# Patient Record
Sex: Male | Born: 1955
Health system: Southern US, Community
[De-identification: ages and names within clinical notes are randomized; demographics above are authoritative.]

## PROBLEM LIST (undated history)

## (undated) DIAGNOSIS — J019 Acute sinusitis, unspecified: Secondary | ICD-10-CM

## (undated) DIAGNOSIS — G5600 Carpal tunnel syndrome, unspecified upper limb: Secondary | ICD-10-CM

## (undated) DIAGNOSIS — E785 Hyperlipidemia, unspecified: Secondary | ICD-10-CM

## (undated) DIAGNOSIS — M5030 Other cervical disc degeneration, unspecified cervical region: Secondary | ICD-10-CM

## (undated) DIAGNOSIS — E538 Deficiency of other specified B group vitamins: Secondary | ICD-10-CM

## (undated) DIAGNOSIS — M1710 Unilateral primary osteoarthritis, unspecified knee: Secondary | ICD-10-CM

## (undated) DIAGNOSIS — T7840XA Allergy, unspecified, initial encounter: Secondary | ICD-10-CM

## (undated) DIAGNOSIS — E291 Testicular hypofunction: Secondary | ICD-10-CM

## (undated) DIAGNOSIS — E039 Hypothyroidism, unspecified: Secondary | ICD-10-CM

## (undated) DIAGNOSIS — J309 Allergic rhinitis, unspecified: Secondary | ICD-10-CM

## (undated) DIAGNOSIS — IMO0002 Reserved for concepts with insufficient information to code with codable children: Secondary | ICD-10-CM

## (undated) DIAGNOSIS — K589 Irritable bowel syndrome without diarrhea: Secondary | ICD-10-CM

## (undated) HISTORY — DX: Carpal tunnel syndrome, unspecified upper limb: G56.00

## (undated) HISTORY — DX: Hyperlipidemia, unspecified: E78.5

## (undated) HISTORY — DX: Unilateral primary osteoarthritis, unspecified knee: M17.10

## (undated) HISTORY — DX: Allergic rhinitis, unspecified: J30.9

## (undated) HISTORY — DX: Acute sinusitis, unspecified: J01.90

## (undated) HISTORY — PX: KNEE SURGERY: SHX244

## (undated) HISTORY — DX: Deficiency of other specified B group vitamins: E53.8

## (undated) HISTORY — DX: Other cervical disc degeneration, unspecified cervical region: M50.30

## (undated) HISTORY — DX: Hypothyroidism, unspecified: E03.9

## (undated) HISTORY — DX: Testicular hypofunction: E29.1

## (undated) HISTORY — DX: Reserved for concepts with insufficient information to code with codable children: IMO0002

## (undated) HISTORY — DX: Allergy, unspecified, initial encounter: T78.40XA

## (undated) HISTORY — DX: Irritable bowel syndrome without diarrhea: K58.9

## (undated) HISTORY — PX: BACK SURGERY: SHX140

---

## 2004-12-12 ENCOUNTER — Ambulatory Visit: Payer: Self-pay | Admitting: Internal Medicine

## 2004-12-19 ENCOUNTER — Ambulatory Visit: Payer: Self-pay | Admitting: Internal Medicine

## 2005-02-05 ENCOUNTER — Ambulatory Visit: Payer: Self-pay | Admitting: Internal Medicine

## 2005-02-28 ENCOUNTER — Ambulatory Visit: Payer: Self-pay | Admitting: Internal Medicine

## 2005-03-28 ENCOUNTER — Ambulatory Visit: Payer: Self-pay | Admitting: Internal Medicine

## 2005-04-26 ENCOUNTER — Ambulatory Visit: Payer: Self-pay | Admitting: Internal Medicine

## 2005-05-21 ENCOUNTER — Ambulatory Visit: Payer: Self-pay | Admitting: Internal Medicine

## 2005-06-11 ENCOUNTER — Ambulatory Visit: Payer: Self-pay | Admitting: Internal Medicine

## 2005-06-21 ENCOUNTER — Ambulatory Visit: Payer: Self-pay | Admitting: Internal Medicine

## 2005-07-11 ENCOUNTER — Ambulatory Visit: Payer: Self-pay | Admitting: Internal Medicine

## 2005-08-13 ENCOUNTER — Ambulatory Visit: Payer: Self-pay | Admitting: Internal Medicine

## 2005-08-20 ENCOUNTER — Ambulatory Visit: Payer: Self-pay | Admitting: Internal Medicine

## 2005-08-31 ENCOUNTER — Ambulatory Visit: Payer: Self-pay | Admitting: Internal Medicine

## 2005-09-18 ENCOUNTER — Ambulatory Visit: Payer: Self-pay | Admitting: Internal Medicine

## 2005-10-10 ENCOUNTER — Ambulatory Visit: Payer: Self-pay | Admitting: Internal Medicine

## 2006-03-04 ENCOUNTER — Ambulatory Visit: Payer: Self-pay | Admitting: Internal Medicine

## 2006-03-04 LAB — CONVERTED CEMR LAB
BUN: 11 mg/dL (ref 6–23)
Basophils Relative: 0.1 % (ref 0.0–1.0)
CO2: 34 meq/L — ABNORMAL HIGH (ref 19–32)
Calcium: 9.3 mg/dL (ref 8.4–10.5)
Chloride: 100 meq/L (ref 96–112)
Cholesterol: 179 mg/dL (ref 0–200)
Creatinine, Ser: 0.8 mg/dL (ref 0.4–1.5)
Free T4: 0.9 ng/dL (ref 0.9–1.8)
GFR calc non Af Amer: 110 mL/min
Glucose, Bld: 83 mg/dL (ref 70–99)
HCT: 44.2 % (ref 39.0–52.0)
HDL: 51.2 mg/dL (ref >39.0)
Hemoglobin: 14.9 g/dL (ref 13.0–17.0)
LDL Cholesterol: 114 mg/dL — ABNORMAL HIGH (ref 0–99)
Lymphocytes Relative: 28.6 % (ref 12.0–46.0)
MCHC: 33.8 g/dL (ref 30.0–36.0)
Monocytes Absolute: 0.4 10*3/uL (ref 0.2–0.7)
Monocytes Relative: 8.6 % (ref 3.0–11.0)
Neutro Abs: 2.8 10*3/uL (ref 1.4–7.7)
Neutrophils Relative %: 58.9 % (ref 43.0–77.0)
PSA: 0.78 ng/mL (ref 0.10–4.00)
RDW: 12.1 % (ref 11.5–14.6)
TSH: 5.4 microintl units/mL (ref 0.35–5.50)
VLDL: 13 mg/dL (ref 0–40)

## 2006-03-11 ENCOUNTER — Ambulatory Visit: Payer: Self-pay | Admitting: Internal Medicine

## 2006-04-15 HISTORY — PX: LUMBAR LAMINECTOMY: SHX95

## 2006-06-04 ENCOUNTER — Ambulatory Visit: Payer: Self-pay | Admitting: Internal Medicine

## 2006-06-04 LAB — CONVERTED CEMR LAB
T3 Uptake Ratio: 38.9 % — ABNORMAL HIGH (ref 22.5–37.0)
TSH: 4.6 microintl units/mL (ref 0.35–5.50)

## 2006-06-11 ENCOUNTER — Ambulatory Visit: Payer: Self-pay | Admitting: Internal Medicine

## 2006-08-06 ENCOUNTER — Ambulatory Visit: Payer: Self-pay | Admitting: Internal Medicine

## 2006-08-06 LAB — CONVERTED CEMR LAB: TSH: 2.7 microintl units/mL (ref 0.35–5.50)

## 2006-08-25 ENCOUNTER — Ambulatory Visit: Payer: Self-pay | Admitting: Internal Medicine

## 2006-10-08 DIAGNOSIS — E039 Hypothyroidism, unspecified: Secondary | ICD-10-CM

## 2006-10-08 DIAGNOSIS — E785 Hyperlipidemia, unspecified: Secondary | ICD-10-CM

## 2006-10-08 DIAGNOSIS — J309 Allergic rhinitis, unspecified: Secondary | ICD-10-CM | POA: Insufficient documentation

## 2006-10-08 HISTORY — DX: Hypothyroidism, unspecified: E03.9

## 2006-10-08 HISTORY — DX: Hyperlipidemia, unspecified: E78.5

## 2006-10-08 HISTORY — DX: Allergic rhinitis, unspecified: J30.9

## 2006-12-24 ENCOUNTER — Ambulatory Visit: Payer: Self-pay | Admitting: Internal Medicine

## 2006-12-24 LAB — CONVERTED CEMR LAB
ALT: 33 units/L (ref 0–53)
AST: 30 units/L (ref 0–37)
Albumin: 3.9 g/dL (ref 3.5–5.2)
Basophils Absolute: 0 10*3/uL (ref 0.0–0.1)
Blood in Urine, dipstick: NEGATIVE
Calcium: 9.8 mg/dL (ref 8.4–10.5)
Chloride: 105 meq/L (ref 96–112)
Creatinine, Ser: 0.8 mg/dL (ref 0.4–1.5)
Eosinophils Absolute: 0.2 10*3/uL (ref 0.0–0.6)
Eosinophils Relative: 4.3 % (ref 0.0–5.0)
GFR calc non Af Amer: 110 mL/min
Glucose, Bld: 86 mg/dL (ref 70–99)
HDL: 48 mg/dL (ref >39.0)
Ketones, urine, test strip: NEGATIVE
MCV: 87.5 fL (ref 78.0–100.0)
Nitrite: NEGATIVE
Platelets: 239 10*3/uL (ref 150–400)
Protein, U semiquant: NEGATIVE
RBC: 4.6 M/uL (ref 4.22–5.81)
RDW: 12.1 % (ref 11.5–14.6)
Specific Gravity, Urine: 1.02
Total CHOL/HDL Ratio: 4.2
Triglycerides: 76 mg/dL (ref 0–149)
Urobilinogen, UA: 0.2
WBC: 5 10*3/uL (ref 4.5–10.5)

## 2006-12-31 ENCOUNTER — Ambulatory Visit: Payer: Self-pay | Admitting: Internal Medicine

## 2006-12-31 ENCOUNTER — Encounter: Admission: RE | Admit: 2006-12-31 | Discharge: 2006-12-31 | Payer: Self-pay | Admitting: Sports Medicine

## 2006-12-31 DIAGNOSIS — IMO0002 Reserved for concepts with insufficient information to code with codable children: Secondary | ICD-10-CM

## 2006-12-31 DIAGNOSIS — M5386 Other specified dorsopathies, lumbar region: Secondary | ICD-10-CM | POA: Insufficient documentation

## 2006-12-31 HISTORY — DX: Reserved for concepts with insufficient information to code with codable children: IMO0002

## 2006-12-31 LAB — CONVERTED CEMR LAB
HDL goal, serum: 40 mg/dL
LDL Goal: 160 mg/dL

## 2007-01-05 ENCOUNTER — Telehealth: Payer: Self-pay | Admitting: Internal Medicine

## 2007-01-08 ENCOUNTER — Ambulatory Visit (HOSPITAL_COMMUNITY): Admission: RE | Admit: 2007-01-08 | Discharge: 2007-01-09 | Payer: Self-pay | Admitting: Neurosurgery

## 2007-01-26 ENCOUNTER — Encounter: Payer: Self-pay | Admitting: Internal Medicine

## 2007-03-17 ENCOUNTER — Ambulatory Visit: Payer: Self-pay | Admitting: Internal Medicine

## 2007-03-17 LAB — CONVERTED CEMR LAB
Bilirubin, Direct: 0.1 mg/dL (ref 0.0–0.3)
Cholesterol: 201 mg/dL (ref 0–200)
Direct LDL: 140.7 mg/dL
HDL: 42.2 mg/dL (ref >39.0)
Total Bilirubin: 0.7 mg/dL (ref 0.3–1.2)
Total Protein: 6.4 g/dL (ref 6.0–8.3)
Triglycerides: 61 mg/dL (ref 0–149)

## 2007-04-02 ENCOUNTER — Ambulatory Visit: Payer: Self-pay | Admitting: Internal Medicine

## 2007-05-01 ENCOUNTER — Telehealth: Payer: Self-pay | Admitting: Internal Medicine

## 2007-09-23 ENCOUNTER — Ambulatory Visit: Payer: Self-pay | Admitting: Internal Medicine

## 2007-09-23 LAB — CONVERTED CEMR LAB
ALT: 31 units/L (ref 0–53)
Alkaline Phosphatase: 71 units/L (ref 39–117)
Bilirubin, Direct: 0.1 mg/dL (ref 0.0–0.3)
Cholesterol: 177 mg/dL (ref 0–200)
LDL Cholesterol: 116 mg/dL — ABNORMAL HIGH (ref 0–99)
Total Protein: 6.1 g/dL (ref 6.0–8.3)

## 2007-09-30 ENCOUNTER — Ambulatory Visit: Payer: Self-pay | Admitting: Internal Medicine

## 2007-11-08 ENCOUNTER — Encounter: Admission: RE | Admit: 2007-11-08 | Discharge: 2007-11-08 | Payer: Self-pay | Admitting: Neurosurgery

## 2007-12-01 ENCOUNTER — Telehealth: Payer: Self-pay | Admitting: Internal Medicine

## 2007-12-01 ENCOUNTER — Encounter: Admission: RE | Admit: 2007-12-01 | Discharge: 2007-12-01 | Payer: Self-pay | Admitting: Neurosurgery

## 2007-12-15 ENCOUNTER — Telehealth: Payer: Self-pay | Admitting: Internal Medicine

## 2007-12-31 ENCOUNTER — Ambulatory Visit: Payer: Self-pay | Admitting: Internal Medicine

## 2007-12-31 DIAGNOSIS — M5030 Other cervical disc degeneration, unspecified cervical region: Secondary | ICD-10-CM

## 2007-12-31 DIAGNOSIS — G5600 Carpal tunnel syndrome, unspecified upper limb: Secondary | ICD-10-CM

## 2007-12-31 HISTORY — DX: Other cervical disc degeneration, unspecified cervical region: M50.30

## 2007-12-31 HISTORY — DX: Carpal tunnel syndrome, unspecified upper limb: G56.00

## 2007-12-31 LAB — CONVERTED CEMR LAB: TSH: 0.74 microintl units/mL (ref 0.35–5.50)

## 2008-01-20 ENCOUNTER — Telehealth: Payer: Self-pay | Admitting: Internal Medicine

## 2008-01-22 ENCOUNTER — Telehealth: Payer: Self-pay | Admitting: Internal Medicine

## 2008-03-03 ENCOUNTER — Ambulatory Visit: Payer: Self-pay | Admitting: Internal Medicine

## 2008-03-03 DIAGNOSIS — K589 Irritable bowel syndrome without diarrhea: Secondary | ICD-10-CM

## 2008-03-03 DIAGNOSIS — R404 Transient alteration of awareness: Secondary | ICD-10-CM

## 2008-03-03 HISTORY — DX: Irritable bowel syndrome, unspecified: K58.9

## 2008-03-14 ENCOUNTER — Telehealth: Payer: Self-pay | Admitting: Internal Medicine

## 2008-04-04 ENCOUNTER — Telehealth: Payer: Self-pay | Admitting: Internal Medicine

## 2008-04-26 ENCOUNTER — Encounter: Payer: Self-pay | Admitting: Internal Medicine

## 2008-04-29 ENCOUNTER — Encounter: Admission: RE | Admit: 2008-04-29 | Discharge: 2008-04-29 | Payer: Self-pay | Admitting: Neurosurgery

## 2008-06-03 ENCOUNTER — Telehealth: Payer: Self-pay | Admitting: Internal Medicine

## 2008-12-01 ENCOUNTER — Telehealth: Payer: Self-pay | Admitting: *Deleted

## 2008-12-02 ENCOUNTER — Ambulatory Visit: Payer: Self-pay | Admitting: Internal Medicine

## 2008-12-16 ENCOUNTER — Telehealth: Payer: Self-pay | Admitting: Internal Medicine

## 2009-01-04 ENCOUNTER — Encounter: Payer: Self-pay | Admitting: Internal Medicine

## 2009-01-13 ENCOUNTER — Ambulatory Visit: Payer: Self-pay | Admitting: Internal Medicine

## 2009-03-31 ENCOUNTER — Encounter
Admission: RE | Admit: 2009-03-31 | Discharge: 2009-03-31 | Payer: Self-pay | Admitting: Physical Medicine and Rehabilitation

## 2009-04-15 HISTORY — PX: SPINAL FUSION: SHX223

## 2009-05-10 ENCOUNTER — Ambulatory Visit: Payer: Self-pay | Admitting: Internal Medicine

## 2009-05-10 DIAGNOSIS — M1710 Unilateral primary osteoarthritis, unspecified knee: Secondary | ICD-10-CM | POA: Insufficient documentation

## 2009-05-10 HISTORY — DX: Unilateral primary osteoarthritis, unspecified knee: M17.10

## 2009-05-10 LAB — CONVERTED CEMR LAB: T3, Free: 2.9 pg/mL (ref 2.3–4.2)

## 2009-06-05 ENCOUNTER — Telehealth: Payer: Self-pay | Admitting: Internal Medicine

## 2009-06-06 ENCOUNTER — Telehealth: Payer: Self-pay | Admitting: Internal Medicine

## 2009-06-07 DIAGNOSIS — R197 Diarrhea, unspecified: Secondary | ICD-10-CM

## 2009-06-08 ENCOUNTER — Encounter: Payer: Self-pay | Admitting: Internal Medicine

## 2009-06-26 ENCOUNTER — Ambulatory Visit: Payer: Self-pay | Admitting: Internal Medicine

## 2009-06-26 DIAGNOSIS — E291 Testicular hypofunction: Secondary | ICD-10-CM | POA: Insufficient documentation

## 2009-06-26 DIAGNOSIS — S0083XA Contusion of other part of head, initial encounter: Secondary | ICD-10-CM

## 2009-06-26 DIAGNOSIS — S1093XA Contusion of unspecified part of neck, initial encounter: Secondary | ICD-10-CM

## 2009-06-26 DIAGNOSIS — S0003XA Contusion of scalp, initial encounter: Secondary | ICD-10-CM | POA: Insufficient documentation

## 2009-06-26 HISTORY — DX: Testicular hypofunction: E29.1

## 2009-07-07 ENCOUNTER — Ambulatory Visit: Payer: Self-pay | Admitting: Psychology

## 2009-07-22 ENCOUNTER — Ambulatory Visit: Payer: Self-pay | Admitting: Family Medicine

## 2009-07-22 DIAGNOSIS — T7840XA Allergy, unspecified, initial encounter: Secondary | ICD-10-CM | POA: Insufficient documentation

## 2009-07-22 DIAGNOSIS — J019 Acute sinusitis, unspecified: Secondary | ICD-10-CM

## 2009-07-22 DIAGNOSIS — J0101 Acute recurrent maxillary sinusitis: Secondary | ICD-10-CM | POA: Insufficient documentation

## 2009-07-22 HISTORY — DX: Acute sinusitis, unspecified: J01.90

## 2009-07-22 HISTORY — DX: Allergy, unspecified, initial encounter: T78.40XA

## 2009-07-24 ENCOUNTER — Telehealth (INDEPENDENT_AMBULATORY_CARE_PROVIDER_SITE_OTHER): Payer: Self-pay | Admitting: *Deleted

## 2009-07-25 ENCOUNTER — Telehealth: Payer: Self-pay | Admitting: Internal Medicine

## 2009-08-23 ENCOUNTER — Ambulatory Visit: Payer: Self-pay | Admitting: Internal Medicine

## 2009-08-23 LAB — CONVERTED CEMR LAB
Testosterone-% Free: 2.1 % (ref 1.6–2.9)
Testosterone: 141.58 ng/dL — ABNORMAL LOW (ref 350–890)

## 2009-09-06 ENCOUNTER — Ambulatory Visit: Payer: Self-pay | Admitting: Internal Medicine

## 2009-09-21 ENCOUNTER — Telehealth: Payer: Self-pay | Admitting: Internal Medicine

## 2009-11-03 ENCOUNTER — Telehealth: Payer: Self-pay | Admitting: Internal Medicine

## 2009-11-29 ENCOUNTER — Ambulatory Visit: Payer: Self-pay | Admitting: Internal Medicine

## 2009-11-29 LAB — CONVERTED CEMR LAB
Direct LDL: 175 mg/dL
VLDL: 15.4 mg/dL (ref 0.0–40.0)

## 2009-12-04 ENCOUNTER — Telehealth: Payer: Self-pay | Admitting: Internal Medicine

## 2009-12-22 ENCOUNTER — Telehealth: Payer: Self-pay | Admitting: Internal Medicine

## 2009-12-28 ENCOUNTER — Telehealth: Payer: Self-pay | Admitting: Internal Medicine

## 2010-01-22 ENCOUNTER — Telehealth: Payer: Self-pay | Admitting: Internal Medicine

## 2010-01-23 ENCOUNTER — Encounter: Admission: RE | Admit: 2010-01-23 | Discharge: 2010-01-23 | Payer: Self-pay | Admitting: Neurosurgery

## 2010-02-15 ENCOUNTER — Ambulatory Visit: Payer: Self-pay | Admitting: Internal Medicine

## 2010-02-28 ENCOUNTER — Inpatient Hospital Stay (HOSPITAL_COMMUNITY): Admission: RE | Admit: 2010-02-28 | Discharge: 2010-03-07 | Payer: Self-pay | Admitting: Neurosurgery

## 2010-04-05 ENCOUNTER — Encounter
Admission: RE | Admit: 2010-04-05 | Discharge: 2010-04-05 | Payer: Self-pay | Source: Home / Self Care | Attending: Neurosurgery | Admitting: Neurosurgery

## 2010-04-15 HISTORY — PX: OTHER SURGICAL HISTORY: SHX169

## 2010-04-19 ENCOUNTER — Telehealth: Payer: Self-pay | Admitting: Internal Medicine

## 2010-05-01 ENCOUNTER — Telehealth: Payer: Self-pay | Admitting: Internal Medicine

## 2010-05-01 ENCOUNTER — Encounter
Admission: RE | Admit: 2010-05-01 | Discharge: 2010-05-01 | Payer: Self-pay | Source: Home / Self Care | Attending: Neurosurgery | Admitting: Neurosurgery

## 2010-05-08 ENCOUNTER — Ambulatory Visit
Admission: RE | Admit: 2010-05-08 | Discharge: 2010-05-08 | Payer: Self-pay | Source: Home / Self Care | Attending: Internal Medicine | Admitting: Internal Medicine

## 2010-05-08 ENCOUNTER — Other Ambulatory Visit: Payer: Self-pay | Admitting: Internal Medicine

## 2010-05-08 LAB — TSH: TSH: 1.38 u[IU]/mL (ref 0.35–5.50)

## 2010-05-08 LAB — VITAMIN B12: Vitamin B-12: 497 pg/mL (ref 211–911)

## 2010-05-08 LAB — LIPID PANEL: HDL: 36.5 mg/dL — ABNORMAL LOW (ref >39.00)

## 2010-05-08 LAB — TESTOSTERONE: Testosterone: 346.17 ng/dL — ABNORMAL LOW (ref 350.00–890.00)

## 2010-05-10 ENCOUNTER — Other Ambulatory Visit: Payer: Self-pay | Admitting: Neurosurgery

## 2010-05-10 DIAGNOSIS — Z981 Arthrodesis status: Secondary | ICD-10-CM

## 2010-05-14 ENCOUNTER — Ambulatory Visit
Admission: RE | Admit: 2010-05-14 | Discharge: 2010-05-14 | Payer: Self-pay | Source: Home / Self Care | Attending: Internal Medicine | Admitting: Internal Medicine

## 2010-05-14 DIAGNOSIS — E538 Deficiency of other specified B group vitamins: Secondary | ICD-10-CM

## 2010-05-14 HISTORY — DX: Deficiency of other specified B group vitamins: E53.8

## 2010-05-16 ENCOUNTER — Other Ambulatory Visit: Payer: Self-pay | Admitting: Internal Medicine

## 2010-05-16 DIAGNOSIS — G44009 Cluster headache syndrome, unspecified, not intractable: Secondary | ICD-10-CM
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
## 2010-05-17 NOTE — Progress Notes (Signed)
  Phone Note Call from Patient   Caller: Patient Call For: Stacie Glaze MD Summary of Call: Rosann Auerbach would like to try samples of XR Tramadol if Dr. Lovell Sheehan has any samples. [phone] Initial call taken by: Lynann Beaver CMA AAMA,  May 01, 2010 8:34 AM  Follow-up for Phone Call        per dr Lovell Sheehan-- may have xr tramadol 300 once daily day Follow-up by: Willy Eddy, LPN,  May 01, 2010 10:59 AM    New/Updated Medications: RYZOLT 300 MG XR24H-TAB (TRAMADOL HCL) 1 once daily Prescriptions: RYZOLT 300 MG XR24H-TAB (TRAMADOL HCL) 1 once daily  #30 x 3   Entered by:   Willy Eddy, LPN   Authorized by:   Stacie Glaze MD   Signed by:   Willy Eddy, LPN on 45/40/9811   Method used:   Electronically to        [facility]  [phone]* (retail)       [address]       [place], [place]  [address]       Ph: [phone] or [phone]       Fax: [phone]   RxID:   [id number]

## 2010-05-17 NOTE — Progress Notes (Signed)
Summary: PAIN MED  Phone Note Call from Patient   Caller: Patient Call For: Stacie Glaze MD Reason for Call: Acute Illness, Talk to Nurse, Talk to Doctor Summary of Call: going out of town this afternoon. Back pain , neck pain, and head pain. Lyrica and Ultram three times a day not working. Had Vicodin in the past - would like rx for - called to [facility] if Dr. Lovell Sheehan ok's .   Initial call taken by: Duard Brady LPN,  November 03, 2009 8:37 AM  Follow-up for Phone Call       Follow-up by: Willy Eddy, LPN,  November 03, 2009 10:31 AM    New/Updated Medications: VICODIN 5-500 MG TABS (HYDROCODONE-ACETAMINOPHEN) 1 every 6 hours as needed pain- no more with ov with dr Lovell Sheehan to discuss Prescriptions: VICODIN 5-500 MG TABS (HYDROCODONE-ACETAMINOPHEN) 1 every 6 hours as needed pain- no more with ov with dr Lovell Sheehan to discuss  #12 x 0   Entered by:   Willy Eddy, LPN   Authorized by:   Stacie Glaze MD   Signed by:   Willy Eddy, LPN on 16/01/9603   Method used:   Telephoned to ...       [facility] [address]  [phone]* (retail)       [address]       [place], [place]  [address]       Ph: [phone] or [phone]       Fax: [phone]   RxID:   [id number]

## 2010-05-17 NOTE — Progress Notes (Signed)
Summary: ingrown toenail  Phone Note Call from Patient   Caller: Patient Call For: Stacie Glaze MD Summary of Call: Pt is calling with an ingrown toenail that is infected. Per Dr. Lovell Sheehan....Marland KitchenMarland KitchenKeflex 500 mg. one by mouth qid x 7 days. LM on pt's voice mail. Initial call taken by: Lynann Beaver CMA,  September 21, 2009 1:37 PM    New/Updated Medications: KEFLEX 500 MG CAPS (CEPHALEXIN) one by mouth qid x 7 days Prescriptions: KEFLEX 500 MG CAPS (CEPHALEXIN) one by mouth qid x 7 days  #28 x 0   Entered by:   Lynann Beaver CMA   Authorized by:   Stacie Glaze MD   Signed by:   Lynann Beaver CMA on 09/21/2009   Method used:   Electronically to        [facility]  [phone]* (retail)       [address]       [place], [place]  [address]       Ph: [phone] or [phone]       Fax: [phone]   RxID:   [id number]

## 2010-05-17 NOTE — Assessment & Plan Note (Signed)
Summary: FACE AND NECK BURNING--SEEN IN URGENT CARE YESTERDAY   Vital Signs:  Patient profile:   55 year old male Height:      70 inches Weight:      191 pounds BMI:     27.50 O2 Sat:      96 % on Room air Temp:     97.4 degrees F oral Pulse rate:   80 / minute BP sitting:   116 / 78  (left arm) Cuff size:   large  Vitals Entered By: Payton Spark CMA (July 22, 2009 11:58 AM)  O2 Flow:  Room air CC: ? allergic reaction. Started prednisone 10mg  yesterday for allergies (has had before) now c/o face and neck is burning.    Primary Care Provider:  Stacie Glaze MD  CC:  ? allergic reaction. Started prednisone 10mg  yesterday for allergies (has had before) now c/o face and neck is burning. Marland Kitchen  History of Present Illness: ? allergic reaction. Started prednisone 10mg  yesterday for allergies (has had before) now c/o face and neck is burning. Had a sinus infection and low grade fever (99-100)  for several days (5 days). Dec appetite.  Missed work.  Went to UC yesterday and given prednisone pack and told likely viral.  Hx of allergies. Has had prednisone before multiple times (4 times this year).  Takes zyrtec and occ benadryl as needed for allergies.  Went woke this AM was red and flushed form her waist up to neck. Similar reaction to iodine and shellfish.  Using mucinex -but not new.  Taking St. Johns wort for a few weeks.  Was started on Androgel In March about a month ago.  No recent dose change. No other cough or cold medicines. Did eat a [employer] Pizza last night.  didn't take the prednisone today.  Throat feels sore today.  No SOB or wheezing.  Wife is with him today.   Current Medications (verified): 1)  Daily Vitamins   Tabs (Multiple Vitamin) .... Once Daily 2)  Synthroid 150 Mcg  Tabs (Levothyroxine Sodium) .... Once Daily 3)  Ultram 50 Mg  Tabs (Tramadol Hcl) .... 2 Every 6 Hours With 325 Tylenol 4)  Ambien 10 Mg Tabs (Zolpidem Tartrate) .... One By Mouth Q Hs As Needed 5)   Promethazine Hcl 25 Mg Supp (Promethazine Hcl) .Marland Kitchen.. 1 Per Rectum Every 6-8n Hours As Needed Nausea and Vomiting 6)  Bentyl 10 Mg Caps (Dicyclomine Hcl) .... One By Mouth Three Times A Day Prn 7)  Lyrica 150 Mg Caps (Pregabalin) .... One By Mouth Three Times A Day 8)  Glucosamine 500 Mg Caps (Glucosamine Sulfate) .... 2 Once Daily 9)  Garlic 500 Mg Caps (Garlic) .... 2 Once Daily 10)  Vitamin C 100 Mg Tabs (Ascorbic Acid) .Marland Kitchen.. 1 Once Daily 11)  Soma 250 Mg Tabs (Carisoprodol) .... One By Mouth Q Hs 12)  Androgel Pump 1 % Gel (Testosterone) .... 4 Pumps Once Daily 13)  Calcium Carbonate-Vitamin D 600-400 Mg-Unit Tabs (Calcium Carbonate-Vitamin D) .... One By Mouth Daily  Allergies (verified): 1)  ! Iodine 2)  ! Levaquin  Social History: Reviewed history from 12/31/2006 and no changes required. Married Never Smoked  Physical Exam  General:  Well-developed,well-nourished,in no acute distress; alert,appropriate and cooperative throughout examination Head:  Normocephalic and atraumatic without obvious abnormalities. No apparent alopecia or balding. Mild tenderness over the maxillary sinuses.  Eyes:  No corneal or conjunctival inflammation noted. EOMI. Perrla. Ears:  External ear exam shows no significant  lesions or deformities.  Otoscopic examination reveals clear canals, tympanic membranes are intact bilaterally without bulging, retraction, inflammation or discharge. Hearing is grossly normal bilaterally. Nose:  External nasal examination shows no deformity or inflammation. Nasal mucosa are pink and moist without lesions or exudates. Mouth:  OP with midl erythema. No lesions or driange.  Neck:  No deformities, masses, or tenderness noted. Lungs:  Normal respiratory effort, chest expands symmetrically. Lungs are clear to auscultation, no crackles or wheezes. Heart:  Normal rate and regular rhythm. S1 and S2 normal without gallop, murmur, click, rub or other extra sounds. Skin:  Sligt flushing  of the face and upper chest.  No other rash.  Cervical Nodes:  No lymphadenopathy noted Psych:  Cognition and judgment appear intact. Alert and cooperative with normal attention span and concentration. No apparent delusions, illusions, hallucinations   Impression & Recommendations:  Problem # 1:  ALLERGIC REACTION (ICD-995.3) I do think he has had an allergic reaction to something but unclear. May have been the pizza last night since does have food allergies. Unlikley to be the prednisone but could. go ahead and take second dose today and if if flushing worsens then take 2 Benadryl tabs and stop the prednisone. Hold the androgel for now since relatively new and restart later this week. Removing this from the equation may be helpful.  Also recommend not to eat out today.  The flushing is already better that it was when he woke up around 10am this morning.   Problem # 2:  SINUSITIS - ACUTE-NOS (ICD-461.9) Discussed that if getting worse or not better by the end of the week cna fill the ABX.  Dsicussed that I stronly encouraged him not to fill it for 2-3 days so that if he has anothre allergic reaction that this doesn't complicate matters. Pt agreed and understood clearly that he is not to use the ABX for several days and to only fill if not better in one week or worse.  His updated medication list for this problem includes:    Zithromax Z-pak 250 Mg Tabs (Azithromycin) .Marland Kitchen... Take as directed.  Complete Medication List: 1)  Daily Vitamins Tabs (Multiple vitamin) .... Once daily 2)  Synthroid 150 Mcg Tabs (Levothyroxine sodium) .... Once daily 3)  Ultram 50 Mg Tabs (Tramadol hcl) .... 2 every 6 hours with 325 tylenol 4)  Ambien 10 Mg Tabs (Zolpidem tartrate) .... One by mouth q hs as needed 5)  Promethazine Hcl 25 Mg Supp (Promethazine hcl) .Marland Kitchen.. 1 per rectum every 6-8n hours as needed nausea and vomiting 6)  Bentyl 10 Mg Caps (Dicyclomine hcl) .... One by mouth three times a day prn 7)  Lyrica 150  Mg Caps (Pregabalin) .... One by mouth three times a day 8)  Glucosamine 500 Mg Caps (Glucosamine sulfate) .... 2 once daily 9)  Garlic 500 Mg Caps (Garlic) .... 2 once daily 10)  Vitamin C 100 Mg Tabs (Ascorbic acid) .Marland Kitchen.. 1 once daily 11)  Soma 250 Mg Tabs (Carisoprodol) .... One by mouth q hs 12)  Androgel Pump 1 % Gel (Testosterone) .... 4 pumps once daily 13)  Calcium Carbonate-vitamin D 600-400 Mg-unit Tabs (Calcium carbonate-vitamin d) .... One by mouth daily 14)  Zithromax Z-pak 250 Mg Tabs (Azithromycin) .... Take as directed.  Patient Instructions: 1)  Hold the androgel for a few days.  2)  Take next dose of the prednisone today. If red and hot again then take Benadrul 50mg  and stop the prednisone at that point  3)  Eat at home today.   Prescriptions: ZITHROMAX Z-PAK 250 MG TABS (AZITHROMYCIN) Take as directed.  #1 pack x 0   Entered and Authorized by:   Nani Gasser MD   Signed by:   Nani Gasser MD on 07/22/2009   Method used:   Print then Give to Patient   RxID:   [id number]

## 2010-05-17 NOTE — Progress Notes (Signed)
Summary: diarrhea  LMTCB 06/05/2009  Phone Note Call from Patient   Caller: Patient Call For: Stacie Glaze MD Summary of Call: Diarrhea x one week with cramping and mucus in stool.  Manipulated his diet, taking more fiber etc.  What should he do?  Dyann Kief Surveyor, minerals) [phone] Initial call taken by: Lynann Beaver CMA,  June 05, 2009 12:21 PM  Follow-up for Phone Call        diarhea for one week required stool studies for c-dif, O and P and enteric pathogens set up and see if there is an ov open this week with me or any one Follow-up by: Stacie Glaze MD,  June 05, 2009 2:37 PM  Additional Follow-up for Phone Call Additional follow up Details #1::        LMTCB for stool studies and appt. Additional Follow-up by: Lynann Beaver CMA,  June 05, 2009 2:57 PM    Additional Follow-up for Phone Call Additional follow up Details #2::    Labs scheduled and pt notified. Follow-up by: Lynann Beaver CMA,  June 05, 2009 4:50 PM
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
## 2010-05-17 NOTE — Progress Notes (Signed)
Summary: requesting lab results  Phone Note Call from Patient Call back at [phone]   Caller: Patient---live call Reason for Call: Lab or Test Results, Insurance Question Summary of Call: requesting lab results. Initial call taken by: Warnell Forester,  December 04, 2009 9:06 AM  Follow-up for Phone Call        ov tomorrow - will discuss and copy will be given tomorrow Follow-up by: Willy Eddy, LPN,  December 05, 2009 8:19 AM
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
## 2010-05-17 NOTE — Progress Notes (Signed)
Summary: med refil  Phone Note Refill Request Call back at Home Phone [phone] Message from:  Patient on [facility] [phone]  Refills Requested: Medication #1:  SOMA 250 MG TABS one by mouth q HS also tramadol 50 mg  Initial call taken by: Heron Sabins,  January 22, 2010 8:49 AM    Prescriptions: SOMA 250 MG TABS (CARISOPRODOL) one by mouth q HS  #30 x 1   Entered by:   Willy Eddy, LPN   Authorized by:   Stacie Glaze MD   Signed by:   Willy Eddy, LPN on 14/78/2956   Method used:   Electronically to        [facility]  [phone]* (retail)       [address]       [place], [place]  [address]       Ph: [phone] or [phone]       Fax: [phone]   RxID:   [id number] ULTRAM 50 MG  TABS (TRAMADOL HCL) 2 every 6 hours with 325 tylenol  #240 Each x 1   Entered by:   Willy Eddy, LPN   Authorized by:   Stacie Glaze MD   Signed by:   Willy Eddy, LPN on 95/18/8416   Method used:   Electronically to        [facility]  [phone]* (retail)       [address]       [place], [place]  [address]       Ph: [phone] or [phone]       Fax: [phone]   RxID:   [id number]

## 2010-05-17 NOTE — Progress Notes (Signed)
Summary: Call-A-Nurse Report    Call-A-Nurse Triage Call Report Triage Record Num: [id number] Operator: Josephina Gip Patient Name: Joseph Santana Call Date & Time: 07/22/2009 10:30:13AM Patient Phone: [phone] PCP: Darryll Capers Patient Gender: Male PCP Fax : [phone] Patient DOB: 1956/02/14 Practice Name: [facility] Reason for Call: Pt was seen at [facility] on 07/21/09 for congestion, and flu like symptoms. Was started on Predisone. States face and torso are flushed and burning like he has a sunburn. State he has many allergies and needs to be seen today. Emergent sysmptoms ruled out. Office called and instructed pt to come now to office. Pt states he understands. Protocol(s) Used: Allergic Reaction; Known / Suspected Recommended Outcome per Protocol: Call Provider within 24 Hours Reason for Outcome: First occurrence of mild allergic reaction (rash; hives; itching; nasal congestion; watery, red eyes) that occurs within minutes to several hours after exposure to an allergen. Care Advice:  ~ 07/22/2009 10:47:00AM Page 1 of 1 [id number]

## 2010-05-17 NOTE — Progress Notes (Signed)
Summary: Pt req to get labs done prior to ov. Pls advise.  Phone Note Call from Patient Call back at [phone]   Caller: Patient Summary of Call: Pt req to get cholesterol, thyroid, testosterone, vit d and b-12 lvls lab work done prior to his ov at end of January. Pt would prefer to get in sooner to see Dr. Lovell Sheehan if at all possible. Pls advise.  Initial call taken by: Lucy Antigua,  April 19, 2010 4:07 PM  Follow-up for Phone Call        please orderlipid-272.4/tsh 244.0/testosterone 257.2 vitamin d b12 715.36 1 week prior- no earlier appointments appointment Follow-up by: Willy Eddy, LPN,  April 20, 2010 9:03 AM  Additional Follow-up for Phone Call Additional follow up Details #1::        I called and lft vm for pt to cb and sch labs and that sooner ov in not available. Waiting on cb.  Additional Follow-up by: Lucy Antigua,  April 20, 2010 10:54 AM

## 2010-05-17 NOTE — Progress Notes (Signed)
Summary: lab results  Phone Note Call from Patient   Caller: Patient Call For: Stacie Glaze MD Summary of Call: Pt is asking for last lab results. [phone] Initial call taken by: Lynann Beaver CMA,  December 28, 2009 4:34 PM  Follow-up for Phone Call        left message on machine somewhat abn - needs to make ov to discuss Follow-up by: Willy Eddy, LPN,  December 29, 2009 3:51 PM

## 2010-05-17 NOTE — Progress Notes (Signed)
Summary: review labs  Phone Note Call from Patient   Caller: Patient Call For: Stacie Glaze MD Summary of Call: Pt wants to  drop off Testosterone levels off for Dr. Lovell Sheehan to review. Initial call taken by: Lynann Beaver CMA,  June 06, 2009 4:51 PM  Follow-up for Phone Call        may drop off and I will review Follow-up by: Stacie Glaze MD,  June 06, 2009 5:29 PM     Appended Document: Orders Update     Clinical Lists Changes  Problems: Added new problem of DIARRHEA OF PRESUMED INFECTIOUS ORIGIN (ICD-009.3) Orders: Added new Service order of Specimen Handling (95621) - Signed Added new Test order of T-Culture, C-Diff Toxin A/B [phone]) - Signed Added new Test order of T-Stool Fats [place] Stain [phone]) - Signed Added new Test order of T-Stool Giardia / Crypto- EIA (44010) - Signed Added new Test order of T-Stool for O&P ([id number]) - Signed Added new Test order of T-Culture, Stool ([id number]) - Signed
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
## 2010-05-17 NOTE — Progress Notes (Signed)
Summary: rash  Phone Note Call from Patient   Caller: Patient Call For: Stacie Glaze MD Summary of Call: Pt. had URI and fever last week and out of work.  He develope a rash on his chest, neck and face and was seen this weekend and given Prednisone, and a Zpack, but does not feel any better, and wants to see Dr. Lovell Sheehan.  [phone]  Rash is like sunburn and very hot to the touch. Pt thinks he may be allergic to the Androgel. Initial call taken by: Lynann Beaver CMA,  July 25, 2009 8:08 AM  Follow-up for Phone Call        Pt called again and wants to be seen asap. Pls call [phone] Follow-up by: Lucy Antigua,  July 25, 2009 9:15 AM  Additional Follow-up for Phone Call Additional follow up Details #1::        CERTAINLY ALL THE CAUSES LISTS ARE POSSIBLE  A DRUG RASH WOULD BE "ALL OVER" IF THE RASH IS DIFFUSE STOP THE ZPACK AND REALIZE THAT THIS DRUG TAKES A WHILE TO GET OF OF THE SYSTEM ( BENADRYL 25 MY by mouth EVERY 4 HOURS), IF IT IS JUST ON THE SHOULDER UDER THE APPLICATION AREAS STOP THE TESTOSTERONE  AND APPLY BENADRYL LOTION TO THE SITE UNLIKEY TO BE THE PREDNISONE AS WE GIVE THAT FOR RASHES.  BE CAREFULL IF THE RASH IS IN A "NARROW SITE" AS THIS COULD BE SHINGLES. CONSIDER ov LATER THIS WEEK Additional Follow-up by: Stacie Glaze MD,  July 25, 2009 9:44 AM    Additional Follow-up for Phone Call Additional follow up Details #2::    rash at area where androgel- stopped androgel today and will use benadrl by mouth and on sckin Follow-up by: Willy Eddy, LPN,  July 25, 2009 10:06 AM

## 2010-05-18 ENCOUNTER — Other Ambulatory Visit: Payer: Self-pay | Admitting: Internal Medicine
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
## 2010-05-30 ENCOUNTER — Ambulatory Visit (INDEPENDENT_AMBULATORY_CARE_PROVIDER_SITE_OTHER): Payer: Managed Care, Other (non HMO) | Admitting: Internal Medicine

## 2010-05-30 ENCOUNTER — Encounter: Payer: Self-pay | Admitting: Internal Medicine

## 2010-05-30 VITALS — BP 110/80 | HR 72 | Temp 98.4°F | Resp 14 | Ht 70.0 in | Wt 200.0 lb

## 2010-05-30 DIAGNOSIS — IMO0002 Reserved for concepts with insufficient information to code with codable children: Secondary | ICD-10-CM

## 2010-05-30 DIAGNOSIS — T23249A Burn of second degree of unspecified multiple fingers (nail), including thumb, initial encounter: Secondary | ICD-10-CM

## 2010-05-30 MED ORDER — CARISOPRODOL 250 MG PO TABS: 250.0000 mg | ORAL_TABLET | Freq: Every day | ORAL | Status: DC

## 2010-05-30 NOTE — Progress Notes (Signed)
Called into pharmacy

## 2010-05-30 NOTE — Assessment & Plan Note (Signed)
Persistant pain seeing neurosurgeon Refill soma
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
## 2010-05-30 NOTE — Patient Instructions (Signed)
apply the mixture to the finger daily for at least 4 days

## 2010-05-31 ENCOUNTER — Other Ambulatory Visit: Payer: Self-pay | Admitting: Neurosurgery

## 2010-05-31 ENCOUNTER — Ambulatory Visit
Admission: RE | Admit: 2010-05-31 | Discharge: 2010-05-31 | Disposition: A | Discharge status: Home / Self Care | Payer: Managed Care, Other (non HMO) | Source: Ambulatory Visit | Attending: Neurosurgery | Admitting: Neurosurgery

## 2010-05-31 DIAGNOSIS — M545 Low back pain: Secondary | ICD-10-CM

## 2010-05-31 DIAGNOSIS — Z9889 Other specified postprocedural states: Secondary | ICD-10-CM

## 2010-06-11 ENCOUNTER — Other Ambulatory Visit (INDEPENDENT_AMBULATORY_CARE_PROVIDER_SITE_OTHER): Payer: Managed Care, Other (non HMO) | Admitting: Internal Medicine

## 2010-06-11 ENCOUNTER — Encounter: Payer: Managed Care, Other (non HMO) | Admitting: Internal Medicine

## 2010-06-11 ENCOUNTER — Telehealth: Payer: Self-pay | Admitting: Internal Medicine

## 2010-06-11 DIAGNOSIS — E785 Hyperlipidemia, unspecified: Secondary | ICD-10-CM

## 2010-06-11 DIAGNOSIS — E291 Testicular hypofunction: Secondary | ICD-10-CM

## 2010-06-11 DIAGNOSIS — E349 Endocrine disorder, unspecified: Secondary | ICD-10-CM

## 2010-06-11 DIAGNOSIS — E538 Deficiency of other specified B group vitamins: Secondary | ICD-10-CM

## 2010-06-11 DIAGNOSIS — M5410 Radiculopathy, site unspecified: Secondary | ICD-10-CM

## 2010-06-11 LAB — LIPID PANEL
HDL: 39.9 mg/dL (ref >39.00)
Total CHOL/HDL Ratio: 5
Triglycerides: 113 mg/dL (ref 0.0–149.0)
VLDL: 22.6 mg/dL (ref 0.0–40.0)

## 2010-06-11 LAB — LDL CHOLESTEROL, DIRECT: Direct LDL: 165 mg/dL

## 2010-06-11 MED ORDER — PREGABALIN 75 MG PO CAPS: 75.0000 mg | ORAL_CAPSULE | Freq: Three times a day (TID) | ORAL | Status: DC

## 2010-06-11 NOTE — Progress Notes (Signed)
This encounter was created in error - please disregard.

## 2010-06-11 NOTE — Telephone Encounter (Signed)
May titrate doe to 75 TID  May call in rx

## 2010-06-11 NOTE — Telephone Encounter (Signed)
Pt would like to try lyrica 75 mg three times a day instead of 150mg . [facility] [address] [phone]

## 2010-06-12 ENCOUNTER — Other Ambulatory Visit: Payer: Self-pay | Admitting: Internal Medicine

## 2010-06-15 NOTE — Progress Notes (Signed)
This encounter was created in error - please disregard.

## 2010-06-26 LAB — CBC
HCT: 41.1 % (ref 39.0–52.0)
Hemoglobin: 14.1 g/dL (ref 13.0–17.0)
MCV: 86.7 fL (ref 78.0–100.0)
RBC: 4.74 MIL/uL (ref 4.22–5.81)
RDW: 12.8 % (ref 11.5–15.5)
WBC: 5.3 10*3/uL (ref 4.0–10.5)

## 2010-06-26 LAB — TYPE AND SCREEN: Antibody Screen: NEGATIVE

## 2010-06-26 LAB — ABO/RH: ABO/RH(D): O POS

## 2010-06-26 LAB — SURGICAL PCR SCREEN: Staphylococcus aureus: NEGATIVE

## 2010-07-17 ENCOUNTER — Other Ambulatory Visit: Payer: Self-pay | Admitting: Internal Medicine

## 2010-07-23 ENCOUNTER — Other Ambulatory Visit: Payer: Self-pay | Admitting: Internal Medicine

## 2010-08-01 ENCOUNTER — Telehealth: Payer: Self-pay | Admitting: *Deleted

## 2010-08-01 MED ORDER — METHYLPREDNISOLONE 4 MG PO KIT: PACK | ORAL | Status: AC

## 2010-08-01 NOTE — Telephone Encounter (Signed)
Pt ate salmon x 2 and has had redness on face and torso.  Using ice and benadryl but still symptomatic.  What should he do?

## 2010-08-02 ENCOUNTER — Other Ambulatory Visit: Payer: Managed Care, Other (non HMO)

## 2010-08-03 ENCOUNTER — Ambulatory Visit (INDEPENDENT_AMBULATORY_CARE_PROVIDER_SITE_OTHER): Payer: Managed Care, Other (non HMO) | Admitting: Internal Medicine

## 2010-08-03 ENCOUNTER — Encounter: Payer: Self-pay | Admitting: Internal Medicine

## 2010-08-03 VITALS — BP 120/86 | HR 88 | Wt 197.0 lb

## 2010-08-03 DIAGNOSIS — T7840XA Allergy, unspecified, initial encounter: Secondary | ICD-10-CM

## 2010-08-03 MED ORDER — METHYLPREDNISOLONE ACETATE 80 MG/ML IJ SUSP
80.0000 mg | Freq: Once | INTRAMUSCULAR | Status: AC
  Administered 2010-08-03: 80 mg via INTRAMUSCULAR

## 2010-08-04 ENCOUNTER — Encounter: Payer: Self-pay | Admitting: Internal Medicine

## 2010-08-04 NOTE — Assessment & Plan Note (Signed)
Mild improvement. Continue Medrol Dosepak. Provided with Depo-Medrol 60 mg IM injection today.Followup if no improvement or worsening.

## 2010-08-04 NOTE — Progress Notes (Signed)
  Subjective:    Patient ID: Joseph Santana, male    DOB: Sep 26, 1955, 55 y.o.   MRN: [id number]  HPI Patient presents to clinic for evaluation of facial redness. Is allergic to shellfish and recently ate salmon twice in one day Developed facial redness, upper torso redness and warmth. Wonders if salmon was contaminated cooked with shellfish. Shellfish allergic symptoms have been identical in the past. Denies rash, tongue swelling, difficulty swallowing, shortness of breath or wheezing. Has taken over-the-counter Benadryl and over-the-counter Advil. Was prescribed Medrol Dosepak and is currently on day 3. Has demonstrated mild improvement. o other alleviating or exacerbating factors. No other complaints.  Reviewed past medical history, medications and allergies   Review of Systems see history of present illness     Objective:   Physical Exam  Physical Exam  Vitals reviewed. Constitutional:appears well-developed and well-nourished. No distress.  HENT:  Head: Normocephalic and atraumatic.  Right Ear: Tympanic membrane, external ear and ear canal normal.  Left Ear: Tympanic membrane, external ear and ear canal normal.  Nose: Nose normal.  Mouth/Throat: Oropharynx is clear and moist. No oropharyngeal exudate.  Eyes: Conjunctivae and EOM are normal. Pupils are equal, round, and reactive to light. Right eye exhibits no discharge. Left eye exhibits no discharge. No scleral icterus.  Neck: Neck supple. No thyromegaly present.  Cardiovascular: Normal rate, regular rhythm and normal heart sounds.  Exam reveals no gallop and no friction rub.   No murmur heard. Pulmonary/Chest: Effort normal and breath sounds normal. No respiratory distress.  has no wheezes. She has no rales.  Lymphadenopathy:    She has no cervical adenopathy.  Neurological: She is alert.  Skin: Skin is warm and dry. She is not diaphoretic.  Psychiatric:normal mood and affect.          Assessment & Plan:

## 2010-08-07 ENCOUNTER — Ambulatory Visit
Admission: RE | Admit: 2010-08-07 | Discharge: 2010-08-07 | Disposition: A | Discharge status: Home / Self Care | Payer: Managed Care, Other (non HMO) | Source: Ambulatory Visit | Attending: Neurosurgery | Admitting: Neurosurgery

## 2010-08-07 DIAGNOSIS — M545 Low back pain: Secondary | ICD-10-CM

## 2010-08-08 ENCOUNTER — Other Ambulatory Visit: Payer: Self-pay | Admitting: Internal Medicine

## 2010-08-12 ENCOUNTER — Other Ambulatory Visit: Payer: Self-pay | Admitting: Internal Medicine

## 2010-08-24 ENCOUNTER — Encounter (HOSPITAL_COMMUNITY)
Admission: RE | Admit: 2010-08-24 | Discharge: 2010-08-24 | Disposition: A | Discharge status: Home / Self Care | Payer: Managed Care, Other (non HMO) | Source: Ambulatory Visit | Attending: Neurosurgery | Admitting: Neurosurgery

## 2010-08-24 LAB — COMPREHENSIVE METABOLIC PANEL
AST: 22 U/L (ref 0–37)
Albumin: 4 g/dL (ref 3.5–5.2)
Alkaline Phosphatase: 95 U/L (ref 39–117)
Chloride: 102 mEq/L (ref 96–112)
GFR calc Af Amer: >60 mL/min (ref >60)
Potassium: 4.5 mEq/L (ref 3.5–5.1)
Total Bilirubin: 0.4 mg/dL (ref 0.3–1.2)

## 2010-08-24 LAB — CBC
Platelets: 296 10*3/uL (ref 150–400)
RBC: 5.24 MIL/uL (ref 4.22–5.81)
WBC: 7.6 10*3/uL (ref 4.0–10.5)

## 2010-08-26 ENCOUNTER — Other Ambulatory Visit: Payer: Self-pay | Admitting: Internal Medicine

## 2010-08-27 ENCOUNTER — Inpatient Hospital Stay (HOSPITAL_COMMUNITY)
Admission: RE | Admit: 2010-08-27 | Discharge: 2010-08-30 | DRG: 030 | Disposition: A | Discharge status: Home / Self Care | Payer: Managed Care, Other (non HMO) | Source: Ambulatory Visit | Attending: Neurosurgery | Admitting: Neurosurgery

## 2010-08-27 ENCOUNTER — Ambulatory Visit (HOSPITAL_COMMUNITY): Payer: Managed Care, Other (non HMO)

## 2010-08-27 DIAGNOSIS — T85695A Other mechanical complication of other nervous system device, implant or graft, initial encounter: Principal | ICD-10-CM | POA: Diagnosis present

## 2010-08-27 DIAGNOSIS — E039 Hypothyroidism, unspecified: Secondary | ICD-10-CM | POA: Diagnosis present

## 2010-08-27 DIAGNOSIS — Y849 Medical procedure, unspecified as the cause of abnormal reaction of the patient, or of later complication, without mention of misadventure at the time of the procedure: Secondary | ICD-10-CM | POA: Diagnosis present

## 2010-08-28 NOTE — Op Note (Signed)
NAMEJARMAN, LITTON NO.:  [id number]   MEDICAL RECORD NO.:  [id number]          PATIENT TYPE:  OIB   LOCATION:  3037                         FACILITY:  [facility]   PHYSICIAN:  Donalee Citrin, M.D.        DATE OF BIRTH:  Dec 11, 1955   DATE OF PROCEDURE:  01/08/2007  DATE OF DISCHARGE:                               OPERATIVE REPORT   PREOPERATIVE DIAGNOSIS:  Right S1 radiculopathy from wide ruptured disk,  L5-S1, right.   PROCEDURE:  Lumbar laminectomy and microdiskectomy at L5-S1, right, with  microscopic dissection of the right S1 nerve root.   SURGEON:  Donalee Citrin, M.D.   ASSISTANT:  Tia Alert, MD   ANESTHESIA:  General endotracheal.   HISTORY OF PRESENT ILLNESS:  The patient very pleasant 39 gentleman who  has had progressive worsening back and right leg pain over the last  couple of weeks refractory to steroids, anti-inflammatories and time.  The patient's MRI scan showed a large disk rupture with a free fragment  migrating caudally causing severe spinal stenosis and severe foraminal  stenosis on the S1 nerve root.  The patient had weakness in his plantar  flexion preoperatively and due to the patient's failure to regain  strength and respond to steroid medication and the size and location of  fragment on clinical exam, the patient was recommended a laminectomy and  microdiskectomy.  Risks and benefits of the operation were explained to  the patient who stated understanding and agreed to proceed forward.   DESCRIPTION OF PROCEDURE:  The patient was brought to the OR, where he  was induced under general anesthesia.  He was placed prone on the Wilson  frame and the back was prepped and draped in the usual fashion.  Intraoperative x-ray localized the L5-S1 disk space.  A midline incision  was made after infiltration of 10 mL of lidocaine with epinephrine and  Bovie cautery was used to take down the subcutaneous tissues.  Subperiosteal dissection was carried  out to the lamina of L5 and S1 on  the right side.  Intraoperative x-ray confirmed localization at the  appropriate level, then using a 3-mm Kerrison punch, the inferior aspect  of L5 and superior aspect of the lamina of S1 and medial facet complexes  were underbitten, exposing the thecal sac and the ligament flavum.  This  was all removed in piecemeal fashion.  Then the operating microscope was  draped and brought into the field for microscopic illumination.  The S1  nerve root was identified and displaced laterally.  A large free  fragment of disk presented itself immediately in the axilla; this was  teased away with a nerve hook and removed with pituitary rongeurs.  Then  the remainder of the axilla was explored further after the S1 nerve was  further dissected off of the overlying ligament flavum and the foramen  was further unroofed.  Working in the axilla, all additional free  fragments were removed, then the S1 nerve root was easily mobilized and  reflected medially, exposing the disk space.  This disk space was then  incised with an 11 blade scalpel and radically cleaned out, then annular  rent was immediately appreciated at the inferior and medial aspect of  disk space.  This was all kind of cleaned away using an Epstein curette  and pituitary rongeurs.  At the end of the diskectomy, there was no  further stenosis and the thecal sac was explored with a coronary  dilator, an angled hockey-stick and 4 Penfield.  No further stenosis of  the thecal sac or S1 nerve root and the S1 foramen was widely patent.  The wound was then copious irrigated and meticulous hemostasis was  maintained.  Gelfoam was soaked Depo-Medrol was then laid over the top  of the S1 nerve root and  after meticulous had been maintained, the wound was closed in layers  with Vicryl, with a running 4-0 silk subcuticular in the skin.  Betadine  and dry sterile dressing were applied.  The patient was transferred to   the recovery room in stable condition.  At the end of the case, needle  counts, sponge counts and instrument counts were correct.           ______________________________  Donalee Citrin, M.D.     GC/MEDQ  D:  01/08/2007  T:  01/09/2007  Job:  [id number]

## 2010-09-14 NOTE — Op Note (Signed)
Joseph Santana, Joseph Santana NO.:  [id number]  MEDICAL RECORD NO.:  [id number]           PATIENT TYPE:  O  LOCATION:  3528                         FACILITY:  [facility]  PHYSICIAN:  Donalee Citrin, M.D.        DATE OF BIRTH:  04-Sep-1955  DATE OF PROCEDURE:  08/27/2010 DATE OF DISCHARGE:                              OPERATIVE REPORT   PREOPERATIVE DIAGNOSIS:  Failed hardware, malpositioned pedicle screws.  PROCEDURE:  Re-exploration of fusion, removal of hardware and replacement of right L4 and right S1 pedicle screws and left L5 pedicle screws.  SURGEON:  Donalee Citrin, MD.  ASSISTANT:  Kathaleen Maser. Pool, MD  ANESTHESIA:  General endotracheal.  HISTORY OF PRESENT ILLNESS:  The patient is a 55 year old gentleman who several months ago underwent an L4-S1 fusion.  Initially did very well, however, over last several weeks, he has had progressive worsening back, predominantly right buttock and leg pain.  Follow up CT scan showed a L4 screw that had migrated lateral to the L4 vertebral body as well as a L5 screw on the left that was capturing only the lateral margin of vertebral body and a right S1 screw that was piercing and projecting beyond cortical end plate, so due to not ideal position of these pedicle screw, the patient is recommended reexploration of fusion and placement and redirection of these screws.  Risks and benefits of the operation were explained to the patient.  He understood and agreed to proceed forward.  The patient was to the OR, induced under general anesthesia, positioned prone on the Wilson frame.  Back was prepped and draped in a sterile fashion.  His old incision was opened up.  The scar tissue was dissected free.  The hardware was identified.  The cross-link was removed.  The top tightening nut was removed and rods subsequently removed.  All pedicle screws were tested.  The right L4 and S1 screws were removed. The S1 screw was in acceptable trajectory but  was too long.  The 6 x 40 screw was removed and replaced with 7.5 x 35 screw.  Then, the L4 screw on the right was replaced with a much more medially trajectory pedicle screw.  This was achieved with taking an awl and re-cannulating new pedicle starting at same entry point, directed much more medially. Then, followed with 5.5 tap, followed by 6.5 tap, and then a 6 x 45 screw when a 6 x 50 had been removed from that location.  Using AP fluoro, this confirmed good position and trajectory appropriately medially directed and this screw also had excellent purchase.  Then the left L5 screw was also replaced in similar fashion again using AP fluoroscopy to confirm correct medial trajectory.  After all screws had been placed, fluoroscopy confirmed good position of all screws and rods. The nuts were reapplied.  Mild compression was again applied from L5 to S1 and from L4 to L5 and a cross-link was reinserted using the new angled screws.  Then, the wound was copiously irrigated and meticulous hemostasis was maintained.  A drain was placed.  The wound was closed in layers  with interrupted Vicryl and the skin was closed with running 4-0 subcuticular.  Benzoin and Steri-Strips were applied.  The patient went to recovery room in stable condition.  At the end of the case, instrument and sponge counts were correct.          ______________________________ Donalee Citrin, M.D.     GC/MEDQ  D:  08/27/2010  T:  08/28/2010  Job:  [id number]  Electronically Signed by Donalee Citrin M.D. on 09/14/2010 06:57:31 AM

## 2010-09-14 NOTE — Discharge Summary (Signed)
  NAMETRELON, PLUSH NO.:  [id number]  MEDICAL RECORD NO.:  [id number]           PATIENT TYPE:  I  LOCATION:  3528                         FACILITY:  [facility]  PHYSICIAN:  Donalee Citrin, M.D.        DATE OF BIRTH:  Aug 11, 1955  DATE OF ADMISSION:  08/27/2010 DATE OF DISCHARGE:  08/30/2010                              DISCHARGE SUMMARY   ADMITTING DIAGNOSIS:  Failed hardware.  DISCHARGE DIAGNOSIS:  Failed hardware.  PROCEDURE:  Revision of spinal fusion, L4 to S1.  HOSPITAL COURSE:  The patient is a 54-year gentleman who was admitted to the hospital for exploration of fusion, removal of hardware and replacement of pedicle screws.  The patient was taken to the operating room and underwent the aforementioned procedure.  Postoperatively, the patient did very well, went to recovery room and on the floor.  On the floor, was doing very well and tolerating a regular diet, ambulating, voiding spontaneously, had some difficulty with pain control as well as some postoperative fevers.  All of this was controlled by switching his pain medication as well as put him on incentive spirometry and progressive mobilizing and his drain was able to be taken out on postop day #2 and by postop day #3, he was afebrile, tolerating a diet, ambulating, voiding spontaneously, and tolerating pain on p.o. medications.  The patient was scheduled discharge followup with [place] in 1 week.  DISCHARGE DIAGNOSIS:  Again, failed hardware.          ______________________________ Donalee Citrin, M.D.     GC/MEDQ  D:  08/30/2010  T:  08/30/2010  Job:  [id number]  Electronically Signed by Donalee Citrin M.D. on 09/14/2010 06:57:36 AM

## 2010-10-02 ENCOUNTER — Other Ambulatory Visit: Payer: Self-pay | Admitting: Internal Medicine

## 2010-10-02 NOTE — Telephone Encounter (Signed)
Pt called and is req med for Sinus inf. Pt is req an abx. Pt just had back surgery and can not come in for ov. Pls call in to [facility] on [address].

## 2010-10-03 ENCOUNTER — Other Ambulatory Visit: Payer: Self-pay | Admitting: Neurosurgery

## 2010-10-03 ENCOUNTER — Other Ambulatory Visit: Payer: Managed Care, Other (non HMO)

## 2010-10-03 ENCOUNTER — Ambulatory Visit
Admission: RE | Admit: 2010-10-03 | Discharge: 2010-10-03 | Disposition: A | Discharge status: Home / Self Care | Payer: Managed Care, Other (non HMO) | Source: Ambulatory Visit | Attending: Neurosurgery | Admitting: Neurosurgery

## 2010-10-03 DIAGNOSIS — M545 Low back pain: Secondary | ICD-10-CM

## 2010-11-06 ENCOUNTER — Other Ambulatory Visit: Payer: Self-pay | Admitting: Neurosurgery

## 2010-11-06 ENCOUNTER — Ambulatory Visit
Admission: RE | Admit: 2010-11-06 | Discharge: 2010-11-06 | Disposition: A | Discharge status: Home / Self Care | Payer: Managed Care, Other (non HMO) | Source: Ambulatory Visit | Attending: Neurosurgery | Admitting: Neurosurgery

## 2010-11-06 DIAGNOSIS — M5137 Other intervertebral disc degeneration, lumbosacral region: Secondary | ICD-10-CM

## 2010-11-06 DIAGNOSIS — M47817 Spondylosis without myelopathy or radiculopathy, lumbosacral region: Secondary | ICD-10-CM

## 2010-11-11 ENCOUNTER — Other Ambulatory Visit: Payer: Self-pay | Admitting: Internal Medicine

## 2010-11-29 ENCOUNTER — Other Ambulatory Visit: Payer: Self-pay | Admitting: Internal Medicine

## 2010-11-30 ENCOUNTER — Telehealth: Payer: Self-pay | Admitting: *Deleted

## 2010-11-30 ENCOUNTER — Telehealth: Payer: Self-pay | Admitting: Internal Medicine

## 2010-11-30 MED ORDER — METHYLPREDNISOLONE 4 MG PO KIT: PACK | ORAL | Status: DC

## 2010-11-30 NOTE — Telephone Encounter (Signed)
Pt went to Urgent Care for sinus inf and was given Augmentin. Pt is wondering if he would be better off getting Prednisone to treat conditon. Pts eyes are red, nose burning inside. Pt req Prednisone to be called in to [facility]. Pt leaving to go out of town and would like this to be called in today.

## 2010-11-30 NOTE — Telephone Encounter (Signed)
Give 4 mg medrol dose pack- wife here and written script was given to her

## 2010-11-30 NOTE — Telephone Encounter (Signed)
This is an addendum to earlier telephone message -- the medrol 4mg  dose pack was ordered by dr Lovell Sheehan

## 2010-11-30 NOTE — Telephone Encounter (Signed)
Dr Lovell Sheehan ordered medrol 4 mg dose pack

## 2010-12-07 ENCOUNTER — Ambulatory Visit (INDEPENDENT_AMBULATORY_CARE_PROVIDER_SITE_OTHER): Payer: Managed Care, Other (non HMO) | Admitting: Family Medicine

## 2010-12-07 ENCOUNTER — Encounter: Payer: Self-pay | Admitting: Family Medicine

## 2010-12-07 VITALS — BP 110/80 | Temp 98.0°F | Wt 174.0 lb

## 2010-12-07 DIAGNOSIS — T7840XA Allergy, unspecified, initial encounter: Secondary | ICD-10-CM

## 2010-12-07 DIAGNOSIS — J309 Allergic rhinitis, unspecified: Secondary | ICD-10-CM

## 2010-12-07 DIAGNOSIS — K120 Recurrent oral aphthae: Secondary | ICD-10-CM

## 2010-12-07 MED ORDER — METHYLPREDNISOLONE ACETATE 80 MG/ML IJ SUSP
80.0000 mg | Freq: Once | INTRAMUSCULAR | Status: AC
  Administered 2010-12-07: 80 mg via INTRAMUSCULAR

## 2010-12-07 NOTE — Patient Instructions (Signed)
Drink plenty of fluids. Continue with mouth wash for symptom relief. Use ice chips/cold beverage if you feel swelling is increasing. Finish out current antibiotic.

## 2010-12-07 NOTE — Progress Notes (Signed)
  Subjective:    Patient ID: Joseph Santana, male    DOB: 1955/11/06, 55 y.o.   MRN: [id number]  HPI Patient seen with persistent nasal congestion and sore throat. Nasal symptoms have gone on now for a couple of weeks if not longer. Went to urgent care recently and placed on Augmentin 875 mg twice daily. Sore throat is not improved with that. Last Friday prednisone was added for possible uncontrolled allergies. Patient is taking Benadryl, Zyrtec, and nasal steroid without much improvement. He apparently only took the prednisone for about 3 days. No adverse side effects. He complains of nasal congestion, occasional clear postnasal drip, and sore throat the past 2-3 days. Felt his throat was closing yesterday. No anaphylactic reaction to foods. No clear triggers. Swallowing okay today. No angioedema of lips or tongue.  Patient denies fever or chills. Recent body aches when nasal congestion first started   Review of Systems  Constitutional: Negative for fever, chills and unexpected weight change.  HENT: Positive for sore throat and postnasal drip. Negative for ear pain, trouble swallowing and voice change.   Respiratory: Negative for cough and shortness of breath.   Cardiovascular: Negative for chest pain, palpitations and leg swelling.  Hematological: Negative for adenopathy.       Objective:   Physical Exam  Constitutional: He appears well-developed and well-nourished.  HENT:  Right Ear: External ear normal.  Left Ear: External ear normal.       Mild erythema and edema of uvula but no evidence for obstruction. Mild posterior pharynx erythema with a couple small aphthous ulcers noted. No exudate.  Neck: Neck supple.  Cardiovascular: Normal rate and regular rhythm.   Pulmonary/Chest: Effort normal and breath sounds normal. No respiratory distress. He has no wheezes. He has no rales.  Lymphadenopathy:    He has no cervical adenopathy.          Assessment & Plan:  #1 persistent  rhinitis symptoms, probably allergic and resistant antihistamine and nasal steroid #2 sore throat not improved with antibiotics. Presence of aphthous ulcers suggest viral process  Depo-Medrol 80 mg IM given. Plenty of fluids and ice chips for swelling (uvula). Followup as needed

## 2010-12-11 ENCOUNTER — Telehealth: Payer: Self-pay | Admitting: *Deleted

## 2010-12-11 NOTE — Telephone Encounter (Signed)
Called pt and LMOM letting him know your instructions and that we could certainly get him in with another MD here in the interim.

## 2010-12-11 NOTE — Telephone Encounter (Signed)
Will put on cancellation list- we have not opening until nov- if we have opening we will call him

## 2010-12-11 NOTE — Telephone Encounter (Addendum)
Pt is asking to see Dr Lovell Sheehan ASAP.  Has seen 2 MDs without relief of sore throat, and body aches in the last 2 weeks.

## 2010-12-12 NOTE — Telephone Encounter (Signed)
Will make apoint

## 2010-12-14 ENCOUNTER — Encounter: Payer: Self-pay | Admitting: Internal Medicine

## 2010-12-14 ENCOUNTER — Ambulatory Visit (INDEPENDENT_AMBULATORY_CARE_PROVIDER_SITE_OTHER): Payer: Managed Care, Other (non HMO) | Admitting: Internal Medicine

## 2010-12-14 VITALS — BP 110/76 | HR 76 | Temp 98.2°F | Resp 16 | Ht 70.0 in | Wt 175.0 lb

## 2010-12-14 DIAGNOSIS — K1379 Other lesions of oral mucosa: Secondary | ICD-10-CM

## 2010-12-14 DIAGNOSIS — F329 Major depressive disorder, single episode, unspecified: Secondary | ICD-10-CM

## 2010-12-14 DIAGNOSIS — J309 Allergic rhinitis, unspecified: Secondary | ICD-10-CM

## 2010-12-14 DIAGNOSIS — M62830 Muscle spasm of back: Secondary | ICD-10-CM

## 2010-12-14 DIAGNOSIS — M549 Dorsalgia, unspecified: Secondary | ICD-10-CM

## 2010-12-14 DIAGNOSIS — R110 Nausea: Secondary | ICD-10-CM

## 2010-12-14 DIAGNOSIS — M5380 Other specified dorsopathies, site unspecified: Secondary | ICD-10-CM

## 2010-12-14 DIAGNOSIS — K1370 Unspecified lesions of oral mucosa: Secondary | ICD-10-CM

## 2010-12-14 MED ORDER — DIAZEPAM 5 MG PO TABS: 5.0000 mg | ORAL_TABLET | Freq: Three times a day (TID) | ORAL | Status: DC | PRN

## 2010-12-14 MED ORDER — DULOXETINE HCL 60 MG PO CPEP: 60.0000 mg | ORAL_CAPSULE | Freq: Every day | ORAL | Status: DC

## 2010-12-14 MED ORDER — TAPENTADOL HCL ER 150 MG PO TB12: 1.0000 | ORAL_TABLET | Freq: Two times a day (BID) | ORAL | Status: DC

## 2010-12-14 MED ORDER — FIRST-DUKES MOUTHWASH MT SUSP: 10.0000 mL | Freq: Three times a day (TID) | OROMUCOSAL | Status: DC

## 2010-12-14 MED ORDER — AZELASTINE HCL 0.1 % NA SOLN: 2.0000 | Freq: Two times a day (BID) | NASAL | Status: DC
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
## 2010-12-14 NOTE — Patient Instructions (Signed)
As you start the nucynta initially stay on the tramadol after 2 days go to tramadol twice a day after 2 additional days  the tramadol just at  at bedtime then stop When you have the tramadol down to twice a day he may start the Cymbalta at 30 mg in the morning once you have successfully stop the tramadol you should increase the Cymbalta to 60 mg

## 2010-12-16 ENCOUNTER — Encounter: Payer: Self-pay | Admitting: Internal Medicine

## 2010-12-26 ENCOUNTER — Ambulatory Visit: Payer: Managed Care, Other (non HMO) | Admitting: Internal Medicine

## 2010-12-28 ENCOUNTER — Telehealth: Payer: Self-pay | Admitting: *Deleted

## 2010-12-28 NOTE — Telephone Encounter (Signed)
Pt would like to increase his Nucynta ER 150 mg.  He feels he is not getting adequate pain control.

## 2011-01-02 ENCOUNTER — Other Ambulatory Visit: Payer: Self-pay | Admitting: *Deleted

## 2011-01-02 MED ORDER — TAPENTADOL HCL ER 200 MG PO TB12: 200.0000 mg | ORAL_TABLET | Freq: Two times a day (BID) | ORAL | Status: DC

## 2011-01-02 NOTE — Telephone Encounter (Signed)
May increase to 200 mg extended release twice daily

## 2011-01-02 NOTE — Telephone Encounter (Signed)
Notified pt. 

## 2011-01-11 ENCOUNTER — Telehealth: Payer: Self-pay | Admitting: *Deleted

## 2011-01-11 NOTE — Telephone Encounter (Signed)
Pt is complaining of sinus infection, green and bloody nasal discharge with congestion.  Would like a Zpack.

## 2011-01-11 NOTE — Telephone Encounter (Signed)
We can not call in abx.  He needs to go to Saturday clinic

## 2011-01-11 NOTE — Telephone Encounter (Signed)
Left message on machine for patient

## 2011-01-21 ENCOUNTER — Telehealth: Payer: Self-pay | Admitting: *Deleted

## 2011-01-21 MED ORDER — AZITHROMYCIN 250 MG PO TABS: ORAL_TABLET | ORAL | Status: AC

## 2011-01-21 MED ORDER — AZITHROMYCIN 250 MG PO TABS: ORAL_TABLET | ORAL | Status: DC

## 2011-01-21 NOTE — Telephone Encounter (Signed)
Per dr jenkins-may have z pack 

## 2011-01-21 NOTE — Telephone Encounter (Signed)
Pt. Complains of a sinus infection x 2 weeks, and would like a Zpack.

## 2011-01-24 LAB — BASIC METABOLIC PANEL
CO2: 28
Calcium: 9
Chloride: 101
GFR calc Af Amer: >60
Sodium: 136

## 2011-01-24 LAB — CBC
MCHC: 34.5
MCV: 86.8
Platelets: 284
RDW: 12.6

## 2011-01-29 ENCOUNTER — Other Ambulatory Visit: Payer: Self-pay | Admitting: Internal Medicine

## 2011-01-29 ENCOUNTER — Telehealth: Payer: Self-pay | Admitting: Internal Medicine

## 2011-01-29 DIAGNOSIS — M549 Dorsalgia, unspecified: Secondary | ICD-10-CM

## 2011-01-29 MED ORDER — TAPENTADOL HCL ER 200 MG PO TB12: 200.0000 mg | ORAL_TABLET | Freq: Two times a day (BID) | ORAL | Status: DC

## 2011-01-29 MED ORDER — DULOXETINE HCL 60 MG PO CPEP: 60.0000 mg | ORAL_CAPSULE | Freq: Every day | ORAL | Status: DC

## 2011-01-29 NOTE — Telephone Encounter (Signed)
Signed and ready for pickup 

## 2011-01-29 NOTE — Telephone Encounter (Addendum)
Pt need new rx nucynta er 200mg  and cymbalta 60 mg. Pt will pick up both rx's when ready. Pt had samples of cymbalta

## 2011-01-31 ENCOUNTER — Telehealth: Payer: Self-pay | Admitting: *Deleted

## 2011-01-31 NOTE — Telephone Encounter (Signed)
Pt is having a h/a every day with pain Nucynta and wants to know what Dr Lovell Sheehan would like to do.  Whether he should titrate off the Nucynta or what.  Pt aware Dr Lovell Sheehan won't be back till Thursday

## 2011-02-04 ENCOUNTER — Telehealth: Payer: Self-pay | Admitting: Family Medicine

## 2011-02-04 NOTE — Telephone Encounter (Signed)
This was already taken care of on 10/16, pt aware

## 2011-02-04 NOTE — Telephone Encounter (Signed)
Pulled off Triage VM. Pt usually gets a 90 day supply of his generic thyroid med. Joseph Santana is out, but he needs this refilled. Please call in to the [facility] on [address]. .

## 2011-02-05 ENCOUNTER — Ambulatory Visit
Admission: RE | Admit: 2011-02-05 | Discharge: 2011-02-05 | Disposition: A | Discharge status: Home / Self Care | Payer: Managed Care, Other (non HMO) | Source: Ambulatory Visit | Attending: Neurosurgery | Admitting: Neurosurgery

## 2011-02-05 ENCOUNTER — Other Ambulatory Visit: Payer: Self-pay | Admitting: Neurosurgery

## 2011-02-05 DIAGNOSIS — M545 Low back pain: Secondary | ICD-10-CM

## 2011-02-07 NOTE — Telephone Encounter (Signed)
Pt informed

## 2011-02-07 NOTE — Telephone Encounter (Signed)
Per dr Lovell Sheehan- may titrate to qd and he has ov next Friday to discuss the results

## 2011-02-15 ENCOUNTER — Ambulatory Visit (INDEPENDENT_AMBULATORY_CARE_PROVIDER_SITE_OTHER): Payer: Managed Care, Other (non HMO) | Admitting: Internal Medicine

## 2011-02-15 ENCOUNTER — Encounter: Payer: Self-pay | Admitting: Internal Medicine

## 2011-02-15 VITALS — BP 112/78 | HR 76 | Temp 98.2°F | Resp 16 | Ht 70.0 in | Wt 180.0 lb

## 2011-02-15 DIAGNOSIS — S0003XA Contusion of scalp, initial encounter: Secondary | ICD-10-CM

## 2011-02-15 DIAGNOSIS — S1093XA Contusion of unspecified part of neck, initial encounter: Secondary | ICD-10-CM

## 2011-02-15 DIAGNOSIS — E039 Hypothyroidism, unspecified: Secondary | ICD-10-CM

## 2011-02-15 DIAGNOSIS — E291 Testicular hypofunction: Secondary | ICD-10-CM

## 2011-02-15 DIAGNOSIS — IMO0002 Reserved for concepts with insufficient information to code with codable children: Secondary | ICD-10-CM

## 2011-02-15 DIAGNOSIS — M545 Low back pain, unspecified: Secondary | ICD-10-CM

## 2011-02-15 DIAGNOSIS — Z23 Encounter for immunization: Secondary | ICD-10-CM

## 2011-02-15 MED ORDER — TAPENTADOL HCL ER 200 MG PO TB12: 200.0000 mg | ORAL_TABLET | Freq: Two times a day (BID) | ORAL | Status: DC

## 2011-02-15 MED ORDER — TESTOSTERONE CYPIONATE 200 MG/ML IM SOLN: 100.0000 mg | INTRAMUSCULAR | Status: DC

## 2011-02-15 NOTE — Assessment & Plan Note (Signed)
Not currently taking shots Replaced wi\th topicaland has allergic reaction Resume the shots
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
## 2011-02-15 NOTE — Patient Instructions (Signed)
Muscle milk or myoplex lite at [facility] or [facility]

## 2011-03-14 ENCOUNTER — Other Ambulatory Visit: Payer: Self-pay | Admitting: Internal Medicine

## 2011-03-21 ENCOUNTER — Ambulatory Visit
Admission: RE | Admit: 2011-03-21 | Discharge: 2011-03-21 | Disposition: A | Discharge status: Home / Self Care | Payer: Managed Care, Other (non HMO) | Source: Ambulatory Visit | Attending: Neurosurgery | Admitting: Neurosurgery

## 2011-03-21 DIAGNOSIS — M545 Low back pain: Secondary | ICD-10-CM

## 2011-04-17 ENCOUNTER — Ambulatory Visit (INDEPENDENT_AMBULATORY_CARE_PROVIDER_SITE_OTHER): Payer: Managed Care, Other (non HMO) | Admitting: Internal Medicine

## 2011-04-17 ENCOUNTER — Encounter: Payer: Self-pay | Admitting: Internal Medicine

## 2011-04-17 VITALS — BP 116/74 | HR 88 | Temp 98.2°F | Resp 16 | Ht 70.0 in | Wt 182.0 lb

## 2011-04-17 DIAGNOSIS — E291 Testicular hypofunction: Secondary | ICD-10-CM

## 2011-04-17 DIAGNOSIS — M549 Dorsalgia, unspecified: Secondary | ICD-10-CM

## 2011-04-17 DIAGNOSIS — E039 Hypothyroidism, unspecified: Secondary | ICD-10-CM

## 2011-04-17 DIAGNOSIS — E785 Hyperlipidemia, unspecified: Secondary | ICD-10-CM

## 2011-04-17 DIAGNOSIS — T887XXA Unspecified adverse effect of drug or medicament, initial encounter: Secondary | ICD-10-CM

## 2011-04-17 LAB — LIPID PANEL
Cholesterol: 255 mg/dL — ABNORMAL HIGH (ref 0–200)
Total CHOL/HDL Ratio: 5
VLDL: 25.4 mg/dL (ref 0.0–40.0)

## 2011-04-17 LAB — HEPATIC FUNCTION PANEL
AST: 31 U/L (ref 0–37)
Albumin: 4.5 g/dL (ref 3.5–5.2)
Alkaline Phosphatase: 107 U/L (ref 39–117)
Bilirubin, Direct: 0 mg/dL (ref 0.0–0.3)
Total Bilirubin: 0.5 mg/dL (ref 0.3–1.2)

## 2011-04-17 LAB — LDL CHOLESTEROL, DIRECT: Direct LDL: 183.9 mg/dL

## 2011-04-17 LAB — T4, FREE: Free T4: 1.01 ng/dL (ref 0.60–1.60)

## 2011-04-17 MED ORDER — TAPENTADOL HCL ER 200 MG PO TB12: 200.0000 mg | ORAL_TABLET | Freq: Two times a day (BID) | ORAL | Status: DC

## 2011-04-17 MED ORDER — DULOXETINE HCL 60 MG PO CPEP: 60.0000 mg | ORAL_CAPSULE | Freq: Every day | ORAL | Status: DC

## 2011-04-17 MED ORDER — LEVOTHYROXINE SODIUM 150 MCG PO TABS: 150.0000 ug | ORAL_TABLET | Freq: Every day | ORAL | Status: DC

## 2011-04-17 MED ORDER — LIDOCAINE 5 % EX PTCH: 2.0000 | MEDICATED_PATCH | CUTANEOUS | Status: DC

## 2011-04-17 NOTE — Patient Instructions (Signed)
Will transfer over to 90 day prescriptions

## 2011-04-17 NOTE — Progress Notes (Signed)
Subjective:    Patient ID: Joseph Santana, male    DOB: 02/09/1956, 56 y.o.   MRN: [id number]  HPI Patient is a 56 year old white male who is followed for chronic pain he has had multiple neurosurgical and orthopedic procedures he is on a pain protocol with a pain contract.  It is also followed for hypothyroidism testicular hypo-function and hyperlipidemia he is due screening for his testicular hypofunction and testosterone replacement as well as for cholesterol.  He has done better recently with decreased breakthrough pain better controlled his weight and blood pressure   Review of Systems  Constitutional: Negative for fever and fatigue.  HENT: Negative for hearing loss, congestion, neck pain and postnasal drip.   Eyes: Negative for discharge, redness and visual disturbance.  Respiratory: Negative for cough, shortness of breath and wheezing.   Cardiovascular: Negative for leg swelling.  Gastrointestinal: Negative for abdominal pain, constipation and abdominal distention.  Genitourinary: Negative for urgency and frequency.  Musculoskeletal: Negative for joint swelling and arthralgias.  Skin: Negative for color change and rash.  Neurological: Negative for weakness and light-headedness.  Hematological: Negative for adenopathy.  Psychiatric/Behavioral: Negative for behavioral problems.   Past Medical History  Diagnosis Date  . ALLERGIC REACTION 07/22/2009  . ALLERGIC RHINITIS 10/08/2006  . B12 DEFICIENCY 05/14/2010  . Carpal tunnel syndrome 12/31/2007  . Degeneration of cervical intervertebral disc 12/31/2007  . HYPERLIPIDEMIA 10/08/2006  . HYPOTHYROIDISM 10/08/2006  . Irritable bowel syndrome 03/03/2008  . LUMBAR RADICULOPATHY, RIGHT 12/31/2006  . OSTEOARTHRITIS, LOWER LEG, LEFT 05/10/2009  . SINUSITIS - ACUTE-NOS 07/22/2009  . TESTICULAR HYPOFUNCTION 06/26/2009    History   Social History  . Marital Status: Married    Spouse Name: N/A    Number of Children: N/A  . Years of  Education: N/A   Occupational History  . Not on file.   Social History Main Topics  . Smoking status: Never Smoker   . Smokeless tobacco: Not on file  . Alcohol Use: No  . Drug Use: No  . Sexually Active: Yes   Other Topics Concern  . Not on file   Social History Narrative  . No narrative on file    Past Surgical History  Procedure Date  . Knee surgery   . Spinal fusion   . Spinal fusion revision   . Lumbar laminectomy     with revision    Family History  Problem Relation Age of Onset  . Asthma    . Diabetes    . Allergy (severe)    . Hypothyroidism      Allergies  Allergen Reactions  . Iodine   . Levofloxacin     Current Outpatient Prescriptions on File Prior to Visit  Medication Sig Dispense Refill  . Ascorbic Acid (VITAMIN C) 100 MG tablet Take 100 mg by mouth daily.        . calcium carbonate (OS-CAL) 600 MG TABS Take 600 mg by mouth daily.       . diazepam (VALIUM) 5 MG tablet Take 1 tablet (5 mg total) by mouth every 8 (eight) hours as needed for anxiety or sleep.  90 tablet  3  . Garlic 500 MG CAPS Take by mouth 2 (two) times daily.        . Multiple Vitamin (MULTIVITAMIN) capsule Take 1 capsule by mouth daily.        . Tapentadol HCl 200 MG TB12 Take 200 mg by mouth 2 (two) times daily.  60 tablet  2  .  testosterone cypionate (DEPOTESTOTERONE CYPIONATE) 200 MG/ML injection Inject 0.5 mLs (100 mg total) into the muscle every 14 (fourteen) days.  10 mL  2  . zolpidem (AMBIEN) 10 MG tablet TAKE ONE TABLET BY MOUTH AT BEDTIME AS NEEDED FOR INSOMNIA  20 tablet  5    BP 116/74  Pulse 88  Temp 98.2 F (36.8 C)  Resp 16  Ht 5\' 10"  (1.778 m)  Wt 182 lb (82.555 kg)  BMI 26.11 kg/m2       Objective:   Physical Exam  Constitutional: He appears well-developed and well-nourished.  HENT:  Head: Normocephalic and atraumatic.  Eyes: Conjunctivae are normal. Pupils are equal, round, and reactive to light.  Neck: Normal range of motion. Neck supple.    Cardiovascular: Normal rate and regular rhythm.   Pulmonary/Chest: Effort normal and breath sounds normal.  Abdominal: Soft. Bowel sounds are normal.          Assessment & Plan:  Stable pain management refilled pain medication on a daily basis.  Monica testicular hypofunction with a free and total testosterone monitor hypothyroidism with a TSH T3 free T4 free monitor lipid with a lipid panel and a liver panel.  Also monitor his basic metabolic panel. Continue Cymbalta as an adjuvant therapy for back pain

## 2011-04-18 LAB — TESTOSTERONE, FREE, TOTAL, SHBG
Sex Hormone Binding: 37 nmol/L (ref 13–71)
Testosterone: 132.67 ng/dL — ABNORMAL LOW (ref 250–890)

## 2011-04-23 ENCOUNTER — Telehealth: Payer: Self-pay | Admitting: *Deleted

## 2011-04-23 MED ORDER — AMOXICILLIN-POT CLAVULANATE 875-125 MG PO TABS: 1.0000 | ORAL_TABLET | Freq: Two times a day (BID) | ORAL | Status: AC

## 2011-04-23 NOTE — Telephone Encounter (Signed)
Allergic to Levaquin.  Per Dr. Lovell Sheehan Augmentin 875 mg. One po bid x 7 days.  Notified pt.

## 2011-04-23 NOTE — Telephone Encounter (Signed)
Per dr jenkins- may have levaquin 500 1 qd for 7 days 

## 2011-04-23 NOTE — Telephone Encounter (Signed)
Sore throat, sinus infection and wants Rx  Stronger than Zpack as he gets multiple infections after stopping Zpacks.

## 2011-05-15 ENCOUNTER — Telehealth: Payer: Self-pay | Admitting: Internal Medicine

## 2011-05-15 NOTE — Telephone Encounter (Signed)
Called and lft vm for pt, notifying him that form was completed, faxed, to pt and pts insurance, yesterday as noted.

## 2011-05-15 NOTE — Telephone Encounter (Signed)
Please let pt know it was completed and faxed to his insurance company and was faxed to him (the number he gave me)

## 2011-05-15 NOTE — Telephone Encounter (Signed)
This was done yesterday.  

## 2011-05-15 NOTE — Telephone Encounter (Signed)
Pt brought in a Health Screening form last wk for Dr Lovell Sheehan to complete, sign and fax back to his insurance. Pt said that it was given to front desk, in an envelope with pts name on it. Pt needs to know status and also would like to pick up a copy. If form was not faxed, then pt can pick up the original and send form himself. Pls call pt.

## 2011-05-21 ENCOUNTER — Other Ambulatory Visit: Payer: Self-pay | Admitting: Neurosurgery

## 2011-05-21 DIAGNOSIS — M545 Low back pain: Secondary | ICD-10-CM

## 2011-06-11 ENCOUNTER — Telehealth: Payer: Self-pay | Admitting: Internal Medicine

## 2011-06-11 NOTE — Telephone Encounter (Signed)
Pt called req to get a 90 day supply, with 1 year supply, or just a 90 day supply of Ambien. Pt has already pick up refill, but is req that next refill be for 90 day supply, due to insurance.

## 2011-06-11 NOTE — Telephone Encounter (Signed)
Ok but pt needs to tell us at the time he requests refill

## 2011-07-01 ENCOUNTER — Telehealth: Payer: Self-pay | Admitting: Internal Medicine

## 2011-07-01 MED ORDER — ZOLPIDEM TARTRATE 10 MG PO TABS: 10.0000 mg | ORAL_TABLET | Freq: Every evening | ORAL | Status: DC | PRN

## 2011-07-01 NOTE — Telephone Encounter (Signed)
Pt called req to get a 90 day supply with 1 year refills for generic Ambien, [facility] [address] [facility].

## 2011-07-17 ENCOUNTER — Ambulatory Visit: Payer: Managed Care, Other (non HMO) | Admitting: Internal Medicine

## 2011-07-19 ENCOUNTER — Telehealth: Payer: Self-pay | Admitting: *Deleted

## 2011-07-19 NOTE — Telephone Encounter (Signed)
Doesn't need antibioitc for mold- can try allegry d qd

## 2011-07-19 NOTE — Telephone Encounter (Signed)
Patient is calling for Rx for Augmentin. He has had a problem in his home with mold due to a roof leak.  He was given Augmentin at an Urgent Care and it worked well for him.  [facility] [address].

## 2011-07-19 NOTE — Telephone Encounter (Signed)
Left detailed message on machine for patient 

## 2011-07-22 ENCOUNTER — Ambulatory Visit (INDEPENDENT_AMBULATORY_CARE_PROVIDER_SITE_OTHER): Payer: Managed Care, Other (non HMO) | Admitting: Internal Medicine

## 2011-07-22 ENCOUNTER — Encounter: Payer: Self-pay | Admitting: Internal Medicine

## 2011-07-22 VITALS — BP 132/80 | HR 88 | Temp 98.6°F | Resp 16 | Ht 70.0 in | Wt 189.0 lb

## 2011-07-22 DIAGNOSIS — E785 Hyperlipidemia, unspecified: Secondary | ICD-10-CM

## 2011-07-22 DIAGNOSIS — B370 Candidal stomatitis: Secondary | ICD-10-CM

## 2011-07-22 MED ORDER — FLUCONAZOLE 100 MG PO TABS: 100.0000 mg | ORAL_TABLET | Freq: Every day | ORAL | Status: AC

## 2011-07-22 MED ORDER — RED YEAST RICE 600 MG PO TABS: 1.0000 | ORAL_TABLET | Freq: Two times a day (BID) | ORAL | Status: DC

## 2011-07-22 MED ORDER — TAPENTADOL HCL ER 200 MG PO TB12: 200.0000 mg | ORAL_TABLET | Freq: Two times a day (BID) | ORAL | Status: DC

## 2011-07-22 NOTE — Progress Notes (Signed)
  Subjective:    Patient ID: Joseph Santana, male    DOB: November 11, 1955, 56 y.o.   MRN: [id number]  HPI Patient had an insurance screening showing a prostate-specific antigen of 0.87 which is very stable fasting blood glucose of 84 and HIV negative.  He had an excellent glomerular filtration normal liver functions have slightly elevated cholesterol 220 which we will compare to his previous studies however his triglycerides were greater than 106 his LDL cholesterol slightly elevated at 148. These are improved from January levels were total cholesterol was 250 and his LDL cholesterol was 187.  Done this by adding garlic and diet but has not been able to tolerate fish oil.  He has not tried red rice yeast  Has noted increased allergies and had mole in house now corrected  But it set off his allergies Has been taking significant OTC allergy medications. Took prednisone from urgent care and felt better.  Has been on flonase and claritin    Review of Systems  Constitutional: Negative for fever and fatigue.  HENT: Negative for hearing loss, congestion, neck pain and postnasal drip.   Eyes: Negative for discharge, redness and visual disturbance.  Respiratory: Negative for cough, shortness of breath and wheezing.   Cardiovascular: Negative for leg swelling.  Gastrointestinal: Negative for abdominal pain, constipation and abdominal distention.  Genitourinary: Negative for urgency and frequency.  Musculoskeletal: Negative for joint swelling and arthralgias.  Skin: Negative for color change and rash.  Neurological: Negative for weakness and light-headedness.  Hematological: Negative for adenopathy.  Psychiatric/Behavioral: Negative for behavioral problems.       Objective:   Physical Exam  Constitutional: He appears well-developed and well-nourished.  HENT:  Head: Normocephalic and atraumatic.  Eyes: Conjunctivae are normal. Pupils are equal, round, and reactive to light.  Neck: Normal  range of motion. Neck supple.  Cardiovascular: Normal rate and regular rhythm.   Pulmonary/Chest: Effort normal and breath sounds normal.  Abdominal: Soft. Bowel sounds are normal.          Assessment & Plan:  Add supplement bread yeast rice twice daily  Possible thrush take Diflucan 100 mg by mouth daily for 14 days.  Recommend saline nasal rinse for his Flonase for severe allergies he has severe seasonal allergies at this time.  Continue Claritin 24 hours  Refill of his pain medications per protocol.

## 2011-07-22 NOTE — Patient Instructions (Addendum)
Your cholesterol did improve about 50 points from our readings in January to your insurance readings this month recommend adding red rice yeast 600 mg by mouth twice a day  Do a salt water rinse in her nostrils before your flonase Taken Diflucan daily for the next 14 days

## 2011-08-06 ENCOUNTER — Telehealth: Payer: Self-pay | Admitting: *Deleted

## 2011-08-06 DIAGNOSIS — J329 Chronic sinusitis, unspecified: Secondary | ICD-10-CM

## 2011-08-06 NOTE — Telephone Encounter (Signed)
Notified pt. And referral sent to [facility].

## 2011-08-06 NOTE — Telephone Encounter (Signed)
LMTCB

## 2011-08-06 NOTE — Telephone Encounter (Signed)
Pt is having continuing sinus issues.....nose is bleeding, headaches, feels flushed, post nasal draining and cough.  He is taking Flonase, Saline Nasal Spray, Benadryl, Claritin D, Chlortrimeton, with no relief.

## 2011-08-06 NOTE — Telephone Encounter (Signed)
Per dr jenkins- referral to ent 

## 2011-08-06 NOTE — Telephone Encounter (Signed)
Returned pt's call and no answer again.  LMTCB

## 2011-08-16 ENCOUNTER — Telehealth: Payer: Self-pay | Admitting: *Deleted

## 2011-08-16 NOTE — Telephone Encounter (Signed)
Pt. Saw Narda Bonds MD, but is no better with his allergy  symptoms, and was told by Dr. Ezzard Standing that he would send him to an allergist, but [name] would like to know who Dr. Lovell Sheehan would prefer he see????  Steroids did not help at all from Dr. Ezzard Standing.

## 2011-08-16 NOTE — Telephone Encounter (Signed)
Dr Lovell Sheehan- suggest the allergist across the street from her-[facility]

## 2011-08-16 NOTE — Telephone Encounter (Signed)
LMTCB

## 2011-08-19 NOTE — Telephone Encounter (Signed)
Phone book is full.

## 2011-08-19 NOTE — Telephone Encounter (Signed)
No answer and phone book is full.

## 2011-08-20 ENCOUNTER — Other Ambulatory Visit: Payer: Managed Care, Other (non HMO)

## 2011-08-23 NOTE — Telephone Encounter (Signed)
When pt calls back we will tell him what dr Lovell Sheehan said

## 2011-09-02 ENCOUNTER — Other Ambulatory Visit: Payer: Self-pay | Admitting: Neurosurgery

## 2011-09-02 ENCOUNTER — Other Ambulatory Visit: Payer: Managed Care, Other (non HMO)

## 2011-09-02 DIAGNOSIS — M545 Low back pain: Secondary | ICD-10-CM

## 2011-09-03 ENCOUNTER — Other Ambulatory Visit: Payer: Managed Care, Other (non HMO)

## 2011-09-04 ENCOUNTER — Ambulatory Visit
Admission: RE | Admit: 2011-09-04 | Discharge: 2011-09-04 | Disposition: A | Discharge status: Home / Self Care | Payer: Managed Care, Other (non HMO) | Source: Ambulatory Visit | Attending: Neurosurgery | Admitting: Neurosurgery

## 2011-09-04 DIAGNOSIS — M545 Low back pain: Secondary | ICD-10-CM

## 2011-09-04 MED ORDER — IOHEXOL 300 MG/ML  SOLN: 75.0000 mL | Freq: Once | INTRAMUSCULAR | Status: AC | PRN

## 2011-09-18 ENCOUNTER — Other Ambulatory Visit: Payer: Self-pay | Admitting: Neurosurgery

## 2011-09-18 DIAGNOSIS — M542 Cervicalgia: Secondary | ICD-10-CM

## 2011-09-20 ENCOUNTER — Other Ambulatory Visit: Payer: Managed Care, Other (non HMO)

## 2011-09-20 ENCOUNTER — Other Ambulatory Visit (INDEPENDENT_AMBULATORY_CARE_PROVIDER_SITE_OTHER): Payer: Managed Care, Other (non HMO)

## 2011-09-20 DIAGNOSIS — E785 Hyperlipidemia, unspecified: Secondary | ICD-10-CM

## 2011-09-20 LAB — HEPATIC FUNCTION PANEL
Albumin: 3.9 g/dL (ref 3.5–5.2)
Alkaline Phosphatase: 88 U/L (ref 39–117)
Total Bilirubin: 0.7 mg/dL (ref 0.3–1.2)

## 2011-09-20 LAB — LDL CHOLESTEROL, DIRECT: Direct LDL: 163.8 mg/dL

## 2011-09-20 LAB — LIPID PANEL
HDL: 45.8 mg/dL (ref >39.00)
Total CHOL/HDL Ratio: 5
VLDL: 16.6 mg/dL (ref 0.0–40.0)

## 2011-09-27 ENCOUNTER — Ambulatory Visit: Payer: Managed Care, Other (non HMO) | Admitting: Internal Medicine

## 2011-10-01 ENCOUNTER — Ambulatory Visit
Admission: RE | Admit: 2011-10-01 | Discharge: 2011-10-01 | Disposition: A | Discharge status: Home / Self Care | Payer: Managed Care, Other (non HMO) | Source: Ambulatory Visit | Attending: Neurosurgery | Admitting: Neurosurgery

## 2011-10-01 DIAGNOSIS — M542 Cervicalgia: Secondary | ICD-10-CM

## 2011-10-22 ENCOUNTER — Ambulatory Visit (INDEPENDENT_AMBULATORY_CARE_PROVIDER_SITE_OTHER): Payer: Managed Care, Other (non HMO) | Admitting: Internal Medicine

## 2011-10-22 ENCOUNTER — Encounter: Payer: Self-pay | Admitting: Internal Medicine

## 2011-10-22 VITALS — BP 120/76 | HR 72 | Temp 98.6°F | Resp 16 | Ht 70.0 in | Wt 190.0 lb

## 2011-10-22 DIAGNOSIS — E785 Hyperlipidemia, unspecified: Secondary | ICD-10-CM

## 2011-10-22 DIAGNOSIS — L719 Rosacea, unspecified: Secondary | ICD-10-CM

## 2011-10-22 DIAGNOSIS — M549 Dorsalgia, unspecified: Secondary | ICD-10-CM

## 2011-10-22 DIAGNOSIS — L219 Seborrheic dermatitis, unspecified: Secondary | ICD-10-CM

## 2011-10-22 DIAGNOSIS — E291 Testicular hypofunction: Secondary | ICD-10-CM

## 2011-10-22 MED ORDER — DULOXETINE HCL 30 MG PO CPEP: 30.0000 mg | ORAL_CAPSULE | Freq: Every day | ORAL | Status: DC

## 2011-10-22 MED ORDER — FLAX SEED OIL 1000 MG PO CAPS: 1.0000 | ORAL_CAPSULE | Freq: Every day | ORAL | Status: DC

## 2011-10-22 MED ORDER — TAPENTADOL HCL ER 200 MG PO TB12: 200.0000 mg | ORAL_TABLET | Freq: Two times a day (BID) | ORAL | Status: DC

## 2011-10-22 MED ORDER — "NEEDLE (DISP) 21G X 1"" MISC": 1.0000 [IU] | Status: DC

## 2011-10-22 MED ORDER — CLOBETASOL PROPIONATE 0.05 % EX FOAM: Freq: Two times a day (BID) | CUTANEOUS | Status: DC

## 2011-10-22 NOTE — Progress Notes (Signed)
Subjective:    Patient ID: Joseph Santana, male    DOB: 1955/05/26, 56 y.o.   MRN: [id number]  Hyperlipidemia Pertinent negatives include no shortness of breath.    monitoring labs drawn   Review of Systems  Constitutional: Negative for fever and fatigue.  HENT: Negative for hearing loss, congestion, neck pain and postnasal drip.   Eyes: Negative for discharge, redness and visual disturbance.  Respiratory: Negative for cough, shortness of breath and wheezing.   Cardiovascular: Negative for leg swelling.  Gastrointestinal: Negative for abdominal pain, constipation and abdominal distention.  Genitourinary: Negative for urgency and frequency.  Musculoskeletal: Negative for joint swelling and arthralgias.  Skin: Negative for color change and rash.  Neurological: Negative for weakness and light-headedness.  Hematological: Negative for adenopathy.  Psychiatric/Behavioral: Negative for behavioral problems.   Past Medical History  Diagnosis Date  . ALLERGIC REACTION 07/22/2009  . ALLERGIC RHINITIS 10/08/2006  . B12 DEFICIENCY 05/14/2010  . Carpal tunnel syndrome 12/31/2007  . Degeneration of cervical intervertebral disc 12/31/2007  . HYPERLIPIDEMIA 10/08/2006  . HYPOTHYROIDISM 10/08/2006  . Irritable bowel syndrome 03/03/2008  . LUMBAR RADICULOPATHY, RIGHT 12/31/2006  . OSTEOARTHRITIS, LOWER LEG, LEFT 05/10/2009  . SINUSITIS - ACUTE-NOS 07/22/2009  . TESTICULAR HYPOFUNCTION 06/26/2009    History   Social History  . Marital Status: Married    Spouse Name: N/A    Number of Children: N/A  . Years of Education: N/A   Occupational History  . Not on file.   Social History Main Topics  . Smoking status: Never Smoker   . Smokeless tobacco: Not on file  . Alcohol Use: No  . Drug Use: No  . Sexually Active: Yes   Other Topics Concern  . Not on file   Social History Narrative  . No narrative on file    Past Surgical History  Procedure Date  . Knee surgery   . Spinal fusion   .  Spinal fusion revision   . Lumbar laminectomy     with revision    Family History  Problem Relation Age of Onset  . Asthma    . Diabetes    . Allergy (severe)    . Hypothyroidism      Allergies  Allergen Reactions  . Iodine   . Levofloxacin     Current Outpatient Prescriptions on File Prior to Visit  Medication Sig Dispense Refill  . Garlic 500 MG CAPS Take by mouth 2 (two) times daily.        Marland Kitchen levothyroxine (SYNTHROID, LEVOTHROID) 150 MCG tablet Take 1 tablet (150 mcg total) by mouth daily.  90 tablet  3  . loratadine-pseudoephedrine (CLARITIN-D 24-HOUR) 10-240 MG per 24 hr tablet Take 1 tablet by mouth daily.      . Multiple Vitamin (MULTIVITAMIN) capsule Take 1 capsule by mouth daily.        . Red Yeast Rice 600 MG TABS Take 1 tablet (600 mg total) by mouth 2 (two) times daily.      Marland Kitchen testosterone cypionate (DEPOTESTOTERONE CYPIONATE) 200 MG/ML injection Inject 0.5 mLs (100 mg total) into the muscle every 14 (fourteen) days.  10 mL  2  . zolpidem (AMBIEN) 10 MG tablet Take 1 tablet (10 mg total) by mouth at bedtime as needed for sleep.  90 tablet  1    BP 120/76  Pulse 72  Temp 98.6 F (37 C)  Resp 16  Ht 5\' 10"  (1.778 m)  Wt 190 lb (86.183 kg)  BMI 27.26 kg/m2  Objective:   Physical Exam  Nursing note and vitals reviewed. Constitutional: He appears well-developed and well-nourished.  HENT:  Head: Normocephalic and atraumatic.  Eyes: Conjunctivae are normal. Pupils are equal, round, and reactive to light.  Neck: Normal range of motion. Neck supple.  Cardiovascular: Normal rate and regular rhythm.   Pulmonary/Chest: Effort normal.  Skin: Skin is warm and dry.  Psychiatric: He has a normal mood and affect.          Assessment & Plan:   Treat the seborrhea with topical steroid foam monitoring pain medications use resume low dose cymbalta Refill of nycynta  Patient needs to increase needle diameter from 25-21 for his testosterone injections  prescriptions since then.

## 2011-10-22 NOTE — Patient Instructions (Signed)
The patient is instructed to continue all medications as prescribed. Schedule followup with check out clerk upon leaving the clinic  

## 2011-11-22 ENCOUNTER — Telehealth: Payer: Self-pay | Admitting: Family Medicine

## 2011-11-22 DIAGNOSIS — E291 Testicular hypofunction: Secondary | ICD-10-CM

## 2011-11-22 MED ORDER — TESTOSTERONE CYPIONATE 200 MG/ML IM SOLN: 100.0000 mg | INTRAMUSCULAR | Status: DC

## 2011-11-22 NOTE — Telephone Encounter (Signed)
Message copied by Kathyrn Drown on Fri Nov 22, 2011  9:36 AM ------      Message from: Daphene Calamity      Created: Fri Nov 22, 2011  9:27 AM       Patient calling about a refill on an old Rx is the only info given call back a [phone]

## 2011-11-22 NOTE — Telephone Encounter (Signed)
Called to pharmacy 

## 2011-11-22 NOTE — Telephone Encounter (Signed)
Please see below for refill request. I called patient. He states the Rx is for Testosterone cypionate injections 200mg /mL. States he has been using it on & off, so it's not on the current med list. The script has now run out, and has no refills. Last filled at [facility], but he does not use them anymore. [facility] [address]

## 2011-12-17 ENCOUNTER — Other Ambulatory Visit: Payer: Self-pay | Admitting: *Deleted

## 2011-12-17 MED ORDER — ZOLPIDEM TARTRATE 10 MG PO TABS: 10.0000 mg | ORAL_TABLET | Freq: Every evening | ORAL | Status: DC | PRN

## 2011-12-23 ENCOUNTER — Other Ambulatory Visit: Payer: Self-pay | Admitting: *Deleted

## 2011-12-23 MED ORDER — ZOLPIDEM TARTRATE 10 MG PO TABS: 10.0000 mg | ORAL_TABLET | Freq: Every evening | ORAL | Status: DC | PRN

## 2012-01-08 ENCOUNTER — Ambulatory Visit: Payer: Managed Care, Other (non HMO) | Admitting: Internal Medicine

## 2012-02-11 ENCOUNTER — Ambulatory Visit (INDEPENDENT_AMBULATORY_CARE_PROVIDER_SITE_OTHER): Payer: Managed Care, Other (non HMO)

## 2012-02-11 ENCOUNTER — Other Ambulatory Visit: Payer: Self-pay | Admitting: Internal Medicine

## 2012-02-11 DIAGNOSIS — M549 Dorsalgia, unspecified: Secondary | ICD-10-CM

## 2012-02-11 DIAGNOSIS — Z23 Encounter for immunization: Secondary | ICD-10-CM

## 2012-02-11 MED ORDER — TAPENTADOL HCL ER 200 MG PO TB12: 200.0000 mg | ORAL_TABLET | Freq: Two times a day (BID) | ORAL | Status: DC

## 2012-02-11 NOTE — Telephone Encounter (Signed)
Printed and pt will be here to pick up at 3pm

## 2012-02-11 NOTE — Telephone Encounter (Signed)
Pt needs written rx nucynta extended release 200 mg twice a day.

## 2012-02-12 DIAGNOSIS — Z23 Encounter for immunization: Secondary | ICD-10-CM

## 2012-03-02 ENCOUNTER — Telehealth: Payer: Self-pay | Admitting: Internal Medicine

## 2012-03-02 NOTE — Telephone Encounter (Signed)
May have labs done,but needws ov 1 week after to discuss-- probably will have to be with another provider

## 2012-03-02 NOTE — Telephone Encounter (Signed)
Pt would like to have chole,tsh,liver,testestone,b12 level check. Pt never rsc 01-08-2012 with Dr Leretha Pol. Can I sch?

## 2012-03-03 NOTE — Telephone Encounter (Signed)
lmom for pt to call back

## 2012-03-04 NOTE — Telephone Encounter (Signed)
Pt is sch for blood work on 03-09-2012. Pt is sch to see MD in feb 2014. Pt would like a call back with blood work results

## 2012-03-09 ENCOUNTER — Other Ambulatory Visit (INDEPENDENT_AMBULATORY_CARE_PROVIDER_SITE_OTHER): Payer: Managed Care, Other (non HMO)

## 2012-03-09 DIAGNOSIS — E291 Testicular hypofunction: Secondary | ICD-10-CM

## 2012-03-09 DIAGNOSIS — E538 Deficiency of other specified B group vitamins: Secondary | ICD-10-CM

## 2012-03-09 LAB — HEPATIC FUNCTION PANEL
ALT: 31 U/L (ref 0–53)
AST: 27 U/L (ref 0–37)
Bilirubin, Direct: 0.1 mg/dL (ref 0.0–0.3)
Total Protein: 7.4 g/dL (ref 6.0–8.3)

## 2012-03-09 LAB — VITAMIN B12: Vitamin B-12: >1500 pg/mL — ABNORMAL HIGH (ref 211–911)

## 2012-03-09 LAB — LIPID PANEL
HDL: 40.5 mg/dL (ref >39.00)
Triglycerides: 142 mg/dL (ref 0.0–149.0)

## 2012-04-27 ENCOUNTER — Telehealth: Payer: Self-pay | Admitting: Internal Medicine

## 2012-04-27 ENCOUNTER — Encounter: Payer: Self-pay | Admitting: Family

## 2012-04-27 ENCOUNTER — Ambulatory Visit (INDEPENDENT_AMBULATORY_CARE_PROVIDER_SITE_OTHER): Payer: Managed Care, Other (non HMO) | Admitting: Family

## 2012-04-27 VITALS — BP 114/80 | HR 97 | Temp 98.9°F | Wt 196.0 lb

## 2012-04-27 DIAGNOSIS — J069 Acute upper respiratory infection, unspecified: Secondary | ICD-10-CM

## 2012-04-27 DIAGNOSIS — R05 Cough: Secondary | ICD-10-CM

## 2012-04-27 MED ORDER — HYDROCOD POLST-CHLORPHEN POLST 10-8 MG/5ML PO LQCR: 5.0000 mL | Freq: Two times a day (BID) | ORAL | Status: DC | PRN

## 2012-04-27 MED ORDER — LEVOTHYROXINE SODIUM 150 MCG PO TABS: 150.0000 ug | ORAL_TABLET | Freq: Every day | ORAL | Status: DC

## 2012-04-27 MED ORDER — METHYLPREDNISOLONE 4 MG PO KIT: PACK | ORAL | Status: AC

## 2012-04-27 NOTE — Telephone Encounter (Signed)
Patient Information:  Caller Name: [name]  Phone: [phone]  Patient: Joseph Santana  Gender: Male  DOB: [birth year]  Age: 57 Years  PCP: Joseph Santana (Adults only)  Office Follow Up:  Does the office need to follow up with this patient?: No  Instructions For The Office: N/A  RN Note:  RN triaged patient using [facility] Guidelines for Flu Like Symptoms.  Call Provider within 4 Hours Disposition for 'Flu like symptoms associated with cough and breathing that is more difficult'.  Symptoms  Reason For Call & Symptoms: fever, chills, body aches and cough.  Pt is requesting Tamiflu.  Pt has had a flu shot in October 2013.  Reviewed Health History In EMR: Yes  Reviewed Medications In EMR: Yes  Reviewed Allergies In EMR: Yes  Reviewed Surgeries / Procedures: Yes  Date of Onset of Symptoms: 04/24/2012  Treatments Tried: Advil,  Treatments Tried Worked: No  Any Fever: Yes  Fever Taken: Oral  Fever Time Of Reading: 08:00:00  Fever Last Reading: 100  Guideline(s) Used:  No Protocol Available - Sick Adult  Disposition Per Guideline:   Go to Office Now  Reason For Disposition Reached:   Nursing judgment  Advice Given:  Call Back If:  You become worse.  Appointment Scheduled:  04/27/2012 11:00:00 Appointment Scheduled Provider:  [facility]line [facility])

## 2012-04-27 NOTE — Patient Instructions (Addendum)

## 2012-04-27 NOTE — Progress Notes (Signed)
Subjective:    Patient ID: Joseph Santana, male    DOB: 09-21-1955, 57 y.o.   MRN: [id number]  HPI 57 year old white male, nonsmoker, patient of Dr. Lovell Sheehan is in with complaints of cough, congestion, chills, achiness x3 days. He's been taken over-the-counter Advil with some relief as well as taking Tussend for the cough. Denies any sick contacts. Temperature at home has been around 100.    Review of Systems  Constitutional: Positive for fever, chills and fatigue.  HENT: Positive for congestion, sore throat and postnasal drip.   Respiratory: Positive for cough.   Cardiovascular: Negative.   Musculoskeletal: Negative.   Skin: Negative.   Neurological: Negative.   Hematological: Negative.   Psychiatric/Behavioral: Negative.    Past Medical History  Diagnosis Date  . ALLERGIC REACTION 07/22/2009  . ALLERGIC RHINITIS 10/08/2006  . B12 DEFICIENCY 05/14/2010  . Carpal tunnel syndrome 12/31/2007  . Degeneration of cervical intervertebral disc 12/31/2007  . HYPERLIPIDEMIA 10/08/2006  . HYPOTHYROIDISM 10/08/2006  . Irritable bowel syndrome 03/03/2008  . LUMBAR RADICULOPATHY, RIGHT 12/31/2006  . OSTEOARTHRITIS, LOWER LEG, LEFT 05/10/2009  . SINUSITIS - ACUTE-NOS 07/22/2009  . TESTICULAR HYPOFUNCTION 06/26/2009    History   Social History  . Marital Status: Married    Spouse Name: N/A    Number of Children: N/A  . Years of Education: N/A   Occupational History  . Not on file.   Social History Main Topics  . Smoking status: Never Smoker   . Smokeless tobacco: Not on file  . Alcohol Use: No  . Drug Use: No  . Sexually Active: Yes   Other Topics Concern  . Not on file   Social History Narrative  . No narrative on file    Past Surgical History  Procedure Date  . Knee surgery   . Spinal fusion   . Spinal fusion revision   . Lumbar laminectomy     with revision    Family History  Problem Relation Age of Onset  . Asthma    . Diabetes    . Allergy (severe)    .  Hypothyroidism      Allergies  Allergen Reactions  . Iodine   . Levofloxacin     Current Outpatient Prescriptions on File Prior to Visit  Medication Sig Dispense Refill  . clobetasol (OLUX) 0.05 % topical foam Apply topically 2 (two) times daily.  100 g  2  . Flaxseed, Linseed, (FLAX SEED OIL) 1000 MG CAPS Take 1 capsule (1,000 mg total) by mouth daily.    0  . Garlic 500 MG CAPS Take by mouth 2 (two) times daily.        Marland Kitchen levothyroxine (SYNTHROID, LEVOTHROID) 150 MCG tablet Take 1 tablet (150 mcg total) by mouth daily.  90 tablet  3  . loratadine-pseudoephedrine (CLARITIN-D 24-HOUR) 10-240 MG per 24 hr tablet Take 1 tablet by mouth daily.      . Multiple Vitamin (MULTIVITAMIN) capsule Take 1 capsule by mouth daily.        Marland Kitchen NEEDLE, DISP, 21 G (BD ECLIPSE NEEDLE) 21G X 1" MISC 1 Units by Does not apply route once a week.  25 each  0  . Red Yeast Rice 600 MG TABS Take 1 tablet (600 mg total) by mouth 2 (two) times daily.      . Tapentadol HCl 200 MG TB12 Take 200 mg by mouth 2 (two) times daily.  60 tablet  0  . Tapentadol HCl 200 MG TB12 Take 200 mg  by mouth 2 (two) times daily.  60 tablet  0  . testosterone cypionate (DEPOTESTOTERONE CYPIONATE) 200 MG/ML injection Inject 0.5 mLs (100 mg total) into the muscle every 14 (fourteen) days.  10 mL  0  . zolpidem (AMBIEN) 10 MG tablet Take 1 tablet (10 mg total) by mouth at bedtime as needed for sleep.  90 tablet  1  . DULoxetine (CYMBALTA) 30 MG capsule Take 1 capsule (30 mg total) by mouth daily.  30 capsule  11    BP 114/80  Pulse 97  Temp 98.9 F (37.2 C) (Oral)  Wt 196 lb (88.905 kg)  SpO2 98%chart    Objective:   Physical Exam  Constitutional: He is oriented to person, place, and time. He appears well-developed and well-nourished.  HENT:  Right Ear: External ear normal.  Left Ear: External ear normal.  Nose: Nose normal.  Mouth/Throat: Oropharynx is clear and moist.  Neck: Neck supple.  Cardiovascular: Normal rate, regular  rhythm and normal heart sounds.   Pulmonary/Chest: Effort normal and breath sounds normal.  Neurological: He is alert and oriented to person, place, and time.  Skin: Skin is warm and dry.  Psychiatric: He has a normal mood and affect.          Assessment & Plan:  Assessment: Upper respiratory infection, cough  Plan: Tussionex 1 teaspoon twice a day as needed for cough. Rest. Drink plenty of fluids. Patient call the office if symptoms worsen or persist. Recheck a schedule, and as needed.

## 2012-06-05 ENCOUNTER — Ambulatory Visit (INDEPENDENT_AMBULATORY_CARE_PROVIDER_SITE_OTHER): Payer: Managed Care, Other (non HMO) | Admitting: Internal Medicine

## 2012-06-05 ENCOUNTER — Encounter: Payer: Self-pay | Admitting: Internal Medicine

## 2012-06-05 VITALS — BP 120/80 | HR 72 | Temp 98.2°F | Resp 16 | Ht 70.0 in | Wt 192.0 lb

## 2012-06-05 DIAGNOSIS — M549 Dorsalgia, unspecified: Secondary | ICD-10-CM

## 2012-06-05 DIAGNOSIS — E785 Hyperlipidemia, unspecified: Secondary | ICD-10-CM

## 2012-06-05 DIAGNOSIS — E039 Hypothyroidism, unspecified: Secondary | ICD-10-CM

## 2012-06-05 LAB — LIPID PANEL
LDL Cholesterol: 143 mg/dL — ABNORMAL HIGH (ref 0–99)
Triglycerides: 180 mg/dL — ABNORMAL HIGH (ref <150)
VLDL: 36 mg/dL (ref 0–40)

## 2012-06-05 MED ORDER — TAPENTADOL HCL ER 200 MG PO TB12: 200.0000 mg | ORAL_TABLET | Freq: Two times a day (BID) | ORAL | Status: DC

## 2012-06-05 NOTE — Progress Notes (Signed)
Subjective:    Patient ID: Joseph Santana, male    DOB: [birth year], 57 y.o.   MRN: [id number]  HPI  Allergies on singular protocols Stable On minicycline for rosacea On tapentalol for pain management   Review of Systems  Constitutional: Negative for fever and fatigue.  HENT: Negative for hearing loss, congestion, neck pain and postnasal drip.   Eyes: Negative for discharge, redness and visual disturbance.  Respiratory: Negative for cough, shortness of breath and wheezing.   Cardiovascular: Negative for leg swelling.  Gastrointestinal: Negative for abdominal pain, constipation and abdominal distention.  Genitourinary: Negative for urgency and frequency.  Musculoskeletal: Negative for joint swelling and arthralgias.  Skin: Negative for color change and rash.  Neurological: Negative for weakness and light-headedness.  Hematological: Negative for adenopathy.  Psychiatric/Behavioral: Negative for behavioral problems.   Past Medical History  Diagnosis Date  . ALLERGIC REACTION 07/22/2009  . ALLERGIC RHINITIS 10/08/2006  . B12 DEFICIENCY 05/14/2010  . Carpal tunnel syndrome 12/31/2007  . Degeneration of cervical intervertebral disc 12/31/2007  . HYPERLIPIDEMIA 10/08/2006  . HYPOTHYROIDISM 10/08/2006  . Irritable bowel syndrome 03/03/2008  . LUMBAR RADICULOPATHY, RIGHT 12/31/2006  . OSTEOARTHRITIS, LOWER LEG, LEFT 05/10/2009  . SINUSITIS - ACUTE-NOS 07/22/2009  . TESTICULAR HYPOFUNCTION 06/26/2009    History   Social History  . Marital Status: Married    Spouse Name: N/A    Number of Children: N/A  . Years of Education: N/A   Occupational History  . Not on file.   Social History Main Topics  . Smoking status: Never Smoker   . Smokeless tobacco: Not on file  . Alcohol Use: No  . Drug Use: No  . Sexually Active: Yes   Other Topics Concern  . Not on file   Social History Narrative  . No narrative on file    Past Surgical History  Procedure Laterality Date  . Knee surgery     . Spinal fusion    . Spinal fusion revision    . Lumbar laminectomy      with revision    Family History  Problem Relation Age of Onset  . Asthma    . Diabetes    . Allergy (severe)    . Hypothyroidism      Allergies  Allergen Reactions  . Iodine   . Levofloxacin     Current Outpatient Prescriptions on File Prior to Visit  Medication Sig Dispense Refill  . chlorpheniramine-HYDROcodone (TUSSIONEX PENNKINETIC ER) 10-8 MG/5ML LQCR Take 5 mLs by mouth every 12 (twelve) hours as needed.  140 mL  0  . Flaxseed, Linseed, (FLAX SEED OIL) 1000 MG CAPS Take 1 capsule (1,000 mg total) by mouth daily.    0  . Garlic 500 MG CAPS Take by mouth 2 (two) times daily.        Marland Kitchen levothyroxine (SYNTHROID, LEVOTHROID) 150 MCG tablet Take 1 tablet (150 mcg total) by mouth daily.  90 tablet  3  . Multiple Vitamin (MULTIVITAMIN) capsule Take 1 capsule by mouth daily.        Marland Kitchen NEEDLE, DISP, 21 G (BD ECLIPSE NEEDLE) 21G X 1" MISC 1 Units by Does not apply route once a week.  25 each  0  . Red Yeast Rice 600 MG TABS Take 1 tablet (600 mg total) by mouth 2 (two) times daily.      Marland Kitchen testosterone cypionate (DEPOTESTOTERONE CYPIONATE) 200 MG/ML injection Inject 0.5 mLs (100 mg total) into the muscle every 14 (fourteen) days.  10 mL  0  . zolpidem (AMBIEN) 10 MG tablet Take 1 tablet (10 mg total) by mouth at bedtime as needed for sleep.  90 tablet  1   No current facility-administered medications on file prior to visit.    BP 120/80  Pulse 72  Temp(Src) 98.2 F (36.8 C)  Resp 16  Ht 5\' 10"  (1.778 m)  Wt 192 lb (87.091 kg)  BMI 27.55 kg/m2        Objective:   Physical Exam  Constitutional: He appears well-developed and well-nourished.  HENT:  Head: Normocephalic and atraumatic.  Eyes: Conjunctivae are normal. Pupils are equal, round, and reactive to light.  Neck: Normal range of motion. Neck supple.  Cardiovascular: Normal rate and regular rhythm.   Pulmonary/Chest: Effort normal and breath  sounds normal.  Abdominal: Soft. Bowel sounds are normal.          Assessment & Plan:  Stable pain management Allergy shots for allergies/ better allergies with change in HVAC And use of singular lipid

## 2012-06-08 ENCOUNTER — Other Ambulatory Visit: Payer: Self-pay | Admitting: Internal Medicine

## 2012-06-15 ENCOUNTER — Telehealth: Payer: Self-pay | Admitting: Internal Medicine

## 2012-06-15 NOTE — Telephone Encounter (Signed)
Left message on machine Cant fax to place of business due to hippa-up front in envelope to pick up

## 2012-06-15 NOTE — Telephone Encounter (Signed)
Pt would like results of his labs from 2/21 faxed to [phone]  No cover sheet needed.

## 2012-06-21 ENCOUNTER — Other Ambulatory Visit: Payer: Self-pay | Admitting: Internal Medicine

## 2012-07-20 ENCOUNTER — Other Ambulatory Visit: Payer: Self-pay | Admitting: Internal Medicine

## 2012-08-03 ENCOUNTER — Other Ambulatory Visit: Payer: Self-pay | Admitting: Internal Medicine

## 2012-09-14 ENCOUNTER — Other Ambulatory Visit: Payer: Self-pay | Admitting: Internal Medicine

## 2012-10-17 ENCOUNTER — Other Ambulatory Visit: Payer: Self-pay | Admitting: Internal Medicine

## 2012-11-19 ENCOUNTER — Other Ambulatory Visit (INDEPENDENT_AMBULATORY_CARE_PROVIDER_SITE_OTHER): Payer: BC Managed Care – PPO

## 2012-11-19 DIAGNOSIS — E785 Hyperlipidemia, unspecified: Secondary | ICD-10-CM

## 2012-11-19 DIAGNOSIS — Z0000 Encounter for general adult medical examination without abnormal findings: Secondary | ICD-10-CM

## 2012-11-19 LAB — BASIC METABOLIC PANEL
BUN: 13 mg/dL (ref 6–23)
Chloride: 104 mEq/L (ref 96–112)
GFR: 118.62 mL/min (ref >60.00)
Potassium: 4.2 mEq/L (ref 3.5–5.1)
Sodium: 139 mEq/L (ref 135–145)

## 2012-11-19 LAB — LIPID PANEL
Cholesterol: 208 mg/dL — ABNORMAL HIGH (ref 0–200)
HDL: 46.3 mg/dL (ref >39.00)
Total CHOL/HDL Ratio: 4
Triglycerides: 65 mg/dL (ref 0.0–149.0)
VLDL: 13 mg/dL (ref 0.0–40.0)

## 2012-12-08 ENCOUNTER — Telehealth: Payer: Self-pay | Admitting: Internal Medicine

## 2012-12-08 NOTE — Telephone Encounter (Signed)
Pt needs new rxs nucyneta ?150 mg #30. Pt would like rxs for next 3 month #30 each.

## 2012-12-09 ENCOUNTER — Other Ambulatory Visit: Payer: Self-pay | Admitting: *Deleted

## 2012-12-09 DIAGNOSIS — M549 Dorsalgia, unspecified: Secondary | ICD-10-CM

## 2012-12-09 MED ORDER — TAPENTADOL HCL ER 200 MG PO TB12: 200.0000 mg | ORAL_TABLET | Freq: Two times a day (BID) | ORAL | Status: DC

## 2012-12-09 NOTE — Telephone Encounter (Signed)
PT INFORMED READY FOR PICK UP

## 2012-12-24 ENCOUNTER — Other Ambulatory Visit: Payer: Managed Care, Other (non HMO)

## 2013-01-21 ENCOUNTER — Telehealth: Payer: Self-pay | Admitting: Internal Medicine

## 2013-01-21 MED ORDER — ZOLPIDEM TARTRATE 10 MG PO TABS: ORAL_TABLET | ORAL | Status: DC

## 2013-01-21 NOTE — Telephone Encounter (Signed)
Pt needs refill on generic ambien 10 mg #30 with refills   sent to [facility].

## 2013-01-21 NOTE — Telephone Encounter (Signed)
done

## 2013-01-22 ENCOUNTER — Ambulatory Visit (INDEPENDENT_AMBULATORY_CARE_PROVIDER_SITE_OTHER): Payer: BC Managed Care – PPO

## 2013-01-22 DIAGNOSIS — Z23 Encounter for immunization: Secondary | ICD-10-CM

## 2013-02-12 ENCOUNTER — Other Ambulatory Visit: Payer: Self-pay | Admitting: *Deleted

## 2013-02-12 ENCOUNTER — Telehealth: Payer: Self-pay | Admitting: Internal Medicine

## 2013-02-12 MED ORDER — AZITHROMYCIN 250 MG PO TABS: ORAL_TABLET | ORAL | Status: DC

## 2013-02-12 NOTE — Telephone Encounter (Signed)
Pt states he needs a rx for a zpac. He has had sinus issues since sat when he did a project in his garage and was around a lot of dust. Pt has taken OTC round the clock w/ no results [facility] [address]

## 2013-02-12 NOTE — Telephone Encounter (Signed)
May have z pack per dr jenkins 

## 2013-04-05 ENCOUNTER — Telehealth: Payer: Self-pay | Admitting: Internal Medicine

## 2013-04-05 NOTE — Telephone Encounter (Signed)
Left message on machine.

## 2013-04-05 NOTE — Telephone Encounter (Signed)
I received a fax from [facility] [employer] denying Lidoderm patch for pt. Reason given was drug is eligible for coverage in the treatment of pain associated with post-herpetic neuralgia

## 2013-04-22 ENCOUNTER — Other Ambulatory Visit: Payer: Self-pay | Admitting: Family

## 2013-05-19 ENCOUNTER — Other Ambulatory Visit: Payer: Self-pay | Admitting: Internal Medicine

## 2013-05-31 ENCOUNTER — Other Ambulatory Visit: Payer: Self-pay | Admitting: *Deleted

## 2013-05-31 ENCOUNTER — Telehealth: Payer: Self-pay | Admitting: Internal Medicine

## 2013-05-31 DIAGNOSIS — M549 Dorsalgia, unspecified: Secondary | ICD-10-CM

## 2013-05-31 MED ORDER — TAPENTADOL HCL ER 200 MG PO TB12: 200.0000 mg | ORAL_TABLET | Freq: Two times a day (BID) | ORAL | Status: DC

## 2013-05-31 NOTE — Telephone Encounter (Signed)
Pt needs new rx?nucynta  twice a day

## 2013-05-31 NOTE — Telephone Encounter (Signed)
Printed and will call pt to pick up after dr jenkins signs 

## 2013-06-07 ENCOUNTER — Other Ambulatory Visit: Payer: Self-pay | Admitting: Internal Medicine

## 2013-06-11 ENCOUNTER — Telehealth: Payer: Self-pay | Admitting: Internal Medicine

## 2013-06-11 NOTE — Telephone Encounter (Signed)
Patient called into Triage line regarding sinus infection. Attempted to call back at [phone]. Voicemail reached. Left message to please contact the office for assistance. No Contact . No triage.

## 2013-08-09 ENCOUNTER — Other Ambulatory Visit: Payer: Self-pay | Admitting: Family

## 2013-08-10 ENCOUNTER — Ambulatory Visit (INDEPENDENT_AMBULATORY_CARE_PROVIDER_SITE_OTHER): Payer: BC Managed Care – PPO | Admitting: Family

## 2013-08-10 ENCOUNTER — Encounter: Payer: Self-pay | Admitting: Family

## 2013-08-10 VITALS — BP 114/74 | HR 80 | Temp 98.6°F | Wt 159.0 lb

## 2013-08-10 DIAGNOSIS — J309 Allergic rhinitis, unspecified: Secondary | ICD-10-CM

## 2013-08-10 DIAGNOSIS — E039 Hypothyroidism, unspecified: Secondary | ICD-10-CM

## 2013-08-10 DIAGNOSIS — E291 Testicular hypofunction: Secondary | ICD-10-CM

## 2013-08-10 DIAGNOSIS — E785 Hyperlipidemia, unspecified: Secondary | ICD-10-CM

## 2013-08-10 LAB — TSH: TSH: 0.27 u[IU]/mL — AB (ref 0.35–5.50)

## 2013-08-10 LAB — TESTOSTERONE: TESTOSTERONE: 836.78 ng/dL (ref 350.00–890.00)

## 2013-08-10 MED ORDER — LEVOTHYROXINE SODIUM 150 MCG PO TABS
ORAL_TABLET | ORAL | Status: DC
Start: 2013-08-10 — End: 2013-08-11

## 2013-08-10 MED ORDER — TESTOSTERONE CYPIONATE 200 MG/ML IM SOLN: INTRAMUSCULAR | Status: DC

## 2013-08-10 NOTE — Progress Notes (Signed)
Subjective:    Patient ID: Joseph Santana, male    DOB: 10-30-1955, 58 y.o.   MRN: [id number]  HPI  58 year old white male, nonsmoker, Patricia NettleFishman Dr. Lovell SheehanJenkins is in today for recheck of hypergonadism, hypothyroidism, and allergic rhinitis. Currently stable on medications denies any concerns. Needs a refill on medications. last complete physical unknown.   Review of Systems  Constitutional: Negative.   HENT: Negative.   Respiratory: Negative.   Cardiovascular: Negative.   Gastrointestinal: Negative.   Endocrine: Negative.   Genitourinary: Negative.   Musculoskeletal: Negative.   Skin: Negative.   Hematological: Negative.   Psychiatric/Behavioral: Negative.    Past Medical History  Diagnosis Date  . ALLERGIC REACTION 07/22/2009  . ALLERGIC RHINITIS 10/08/2006  . B12 DEFICIENCY 05/14/2010  . Carpal tunnel syndrome 12/31/2007  . Degeneration of cervical intervertebral disc 12/31/2007  . HYPERLIPIDEMIA 10/08/2006  . HYPOTHYROIDISM 10/08/2006  . Irritable bowel syndrome 03/03/2008  . LUMBAR RADICULOPATHY, RIGHT 12/31/2006  . OSTEOARTHRITIS, LOWER LEG, LEFT 05/10/2009  . SINUSITIS - ACUTE-NOS 07/22/2009  . TESTICULAR HYPOFUNCTION 06/26/2009    History   Social History  . Marital Status: Married    Spouse Name: N/A    Number of Children: N/A  . Years of Education: N/A   Occupational History  . Not on file.   Social History Main Topics  . Smoking status: Never Smoker   . Smokeless tobacco: Not on file  . Alcohol Use: No  . Drug Use: No  . Sexual Activity: Yes   Other Topics Concern  . Not on file   Social History Narrative  . No narrative on file    Past Surgical History  Procedure Laterality Date  . Knee surgery    . Spinal fusion    . Spinal fusion revision    . Lumbar laminectomy      with revision    Family History  Problem Relation Age of Onset  . Asthma    . Diabetes    . Allergy (severe)    . Hypothyroidism      Allergies  Allergen Reactions  . Iodine    . Levofloxacin     Current Outpatient Prescriptions on File Prior to Visit  Medication Sig Dispense Refill  . B-D 3CC LUER-LOK SYR 21GX1" 21G X 1" 3 ML MISC USE AS DIRECTED  25 each  0  . Flaxseed, Linseed, (FLAX SEED OIL) 1000 MG CAPS Take 1 capsule (1,000 mg total) by mouth daily.    0  . Garlic 500 MG CAPS Take by mouth 2 (two) times daily.        Marland Kitchen. levocetirizine (XYZAL) 5 MG tablet Take 5 mg by mouth every evening.      . lidocaine (LIDODERM) 5 % PLACE 2 PATCHES ONTO THE SKIN DAILY. REMOVE & DISCARD PATCH WITHIN 12 HOURS OR AS DIRECTED BY MD  180 patch  1  . montelukast (SINGULAIR) 10 MG tablet Take 10 mg by mouth at bedtime.      . Multiple Vitamin (MULTIVITAMIN) capsule Take 1 capsule by mouth daily.        . Red Yeast Rice 600 MG TABS Take 1 tablet (600 mg total) by mouth 2 (two) times daily.      . Tapentadol HCl 200 MG TB12 Take 200 mg by mouth 2 (two) times daily.  60 tablet  0  . zolpidem (AMBIEN) 10 MG tablet TAKE 1 TABLET BY MOUTH AT BEDTIME  30 tablet  3   No current facility-administered  medications on file prior to visit.    BP 114/74  Pulse 80  Temp(Src) 98.6 F (37 C) (Oral)  Wt 159 lb (72.122 kg)chart    Objective:   Physical Exam  Constitutional: He is oriented to person, place, and time. He appears well-developed and well-nourished.  Neck: Normal range of motion. Neck supple.  Cardiovascular: Normal rate, regular rhythm and normal heart sounds.   Pulmonary/Chest: Effort normal and breath sounds normal.  Abdominal: Soft. Bowel sounds are normal.  Musculoskeletal: Normal range of motion.  Neurological: He is alert and oriented to person, place, and time.  Skin: Skin is warm and dry.  Psychiatric: He has a normal mood and affect.          Assessment & Plan:  Joseph Santana was seen today for medication refill.  Diagnoses and associated orders for this visit:  Unspecified hypothyroidism - TSH  Hypogonadism male  Other and unspecified  hyperlipidemia - Testosterone  Other Orders - levothyroxine (SYNTHROID, LEVOTHROID) 150 MCG tablet; TAKE 1 TABLET (150 MCG TOTAL) BY MOUTH DAILY. - testosterone cypionate (DEPOTESTOTERONE CYPIONATE) 200 MG/ML injection; INJECT 0.5MLS EVERY 14 DAYS   followup and then the results of the labs. Schedule complete physical in 6 months and sooner as needed.

## 2013-08-11 ENCOUNTER — Telehealth: Payer: Self-pay | Admitting: Internal Medicine

## 2013-08-11 ENCOUNTER — Other Ambulatory Visit: Payer: Self-pay

## 2013-08-11 ENCOUNTER — Other Ambulatory Visit: Payer: Self-pay | Admitting: Family

## 2013-08-11 DIAGNOSIS — E039 Hypothyroidism, unspecified: Secondary | ICD-10-CM

## 2013-08-11 MED ORDER — LEVOTHYROXINE SODIUM 137 MCG PO TABS: 137.0000 ug | ORAL_TABLET | Freq: Every day | ORAL | Status: DC

## 2013-08-11 NOTE — Telephone Encounter (Signed)
Pt receive a call that base on his lab work that he needed to start taking  levothyroxine (SYNTHROID) 137 MCG tablet  Pt would like to know if this has been called in to [facility] pharmacy [facility]  [address]

## 2013-08-12 NOTE — Telephone Encounter (Signed)
Left message to advise pt Rx was sent to pharmacy

## 2013-08-18 ENCOUNTER — Telehealth: Payer: Self-pay | Admitting: Internal Medicine

## 2013-08-18 NOTE — Telephone Encounter (Signed)
Pt needs new rx testosterone cypionate 200 mg sent to [facility]

## 2013-08-19 ENCOUNTER — Other Ambulatory Visit: Payer: Self-pay | Admitting: *Deleted

## 2013-08-19 NOTE — Telephone Encounter (Signed)
Spoke with  Pharmacist and Rx already called in

## 2013-09-17 ENCOUNTER — Other Ambulatory Visit: Payer: Self-pay | Admitting: Internal Medicine

## 2013-09-21 NOTE — Telephone Encounter (Signed)
Pt states he will be out of his Remus Loffler in a couple of days, wants to know when the rx will be sent to [facility] [address].

## 2013-10-18 ENCOUNTER — Other Ambulatory Visit (INDEPENDENT_AMBULATORY_CARE_PROVIDER_SITE_OTHER): Payer: BC Managed Care – PPO

## 2013-10-18 DIAGNOSIS — E039 Hypothyroidism, unspecified: Secondary | ICD-10-CM

## 2013-10-18 LAB — TSH: TSH: 0.62 u[IU]/mL (ref 0.35–4.50)

## 2013-11-02 ENCOUNTER — Telehealth: Payer: Self-pay | Admitting: Internal Medicine

## 2013-11-02 ENCOUNTER — Encounter: Payer: Self-pay | Admitting: Internal Medicine

## 2013-11-02 NOTE — Telephone Encounter (Signed)
Pt called and wanted his current medication list updated in Epic for "insurance purposes".  Pt states he has discontinued use of Tapentadol HCl 200 MG TB12 [[id number]][[id number]] twice daily and also zolpidem (AMBIEN) 10 MG tablet [[id number]][[id number]].  Best number to call pt [phone], if needed.

## 2013-11-02 NOTE — Telephone Encounter (Signed)
Med list updated

## 2013-11-28 ENCOUNTER — Other Ambulatory Visit: Payer: Self-pay | Admitting: Family

## 2013-12-01 ENCOUNTER — Encounter: Payer: BC Managed Care – PPO | Admitting: Sports Medicine

## 2013-12-03 ENCOUNTER — Encounter: Payer: BC Managed Care – PPO | Admitting: Family Medicine

## 2013-12-22 ENCOUNTER — Ambulatory Visit (INDEPENDENT_AMBULATORY_CARE_PROVIDER_SITE_OTHER): Payer: BC Managed Care – PPO | Admitting: Sports Medicine

## 2013-12-22 ENCOUNTER — Encounter: Payer: Self-pay | Admitting: Sports Medicine

## 2013-12-22 VITALS — BP 114/83 | Ht 70.0 in | Wt 150.0 lb

## 2013-12-22 DIAGNOSIS — M79672 Pain in left foot: Secondary | ICD-10-CM

## 2013-12-22 DIAGNOSIS — M79609 Pain in unspecified limb: Secondary | ICD-10-CM

## 2013-12-22 DIAGNOSIS — M76822 Posterior tibial tendinitis, left leg: Secondary | ICD-10-CM | POA: Insufficient documentation

## 2013-12-22 NOTE — Assessment & Plan Note (Signed)
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 -Custom orthotics crafted today with good relief of symptoms -Instructions for PTT exercises to include running 50-60 yards of tip-toe with pigeon-toed stance daily -Walk up and down stairs with 10lb weights tip-toe with pigeon-toed stance once daily -Hopefully this will help him fire his PTT tendon and get better running push-off on the left. -We will check him back in about 6 weeks or sooner if needed.

## 2013-12-22 NOTE — Progress Notes (Signed)
Subjective:    Patient ID: Joseph Santana, male    DOB: January 01, 1956, 58 y.o.   MRN: [id number]  HPI Mr. Crisostomo is a 58 year old male who presents with left medial ankle and left foot pain at the referral of Dr. Madelon Lips. Patient's pain has been present for several months. He is an avid runner, running approximately 50-60 miles per week. He denies any known acute injury or trauma. Location of pain is primarily in the region around the left medial malleolus as well as extending to the left second toe. He denies any associated swelling, bruising, numbness, or tingling. He has a history of loss of his second toenails bilaterally following long-distance running. He is tried wearing a left ankle compression sleeve, which has relieved some of his symptoms. Past medical history is significant for a spinal lumbar fusion with revision surgery. Severity of pain is mainly mild, and does not wake him up at night. He has not tried any other relieving factors.  Review of Systems As per history of present illness, Levan point review of systems was performed is otherwise negative.    Past Medical History  Diagnosis Date  . ALLERGIC REACTION 07/22/2009  . ALLERGIC RHINITIS 10/08/2006  . B12 DEFICIENCY 05/14/2010  . Carpal tunnel syndrome 12/31/2007  . Degeneration of cervical intervertebral disc 12/31/2007  . HYPERLIPIDEMIA 10/08/2006  . HYPOTHYROIDISM 10/08/2006  . Irritable bowel syndrome 03/03/2008  . LUMBAR RADICULOPATHY, RIGHT 12/31/2006  . OSTEOARTHRITIS, LOWER LEG, LEFT 05/10/2009  . SINUSITIS - ACUTE-NOS 07/22/2009  . TESTICULAR HYPOFUNCTION 06/26/2009   Past Surgical History  Procedure Laterality Date  . Knee surgery    . Spinal fusion    . Spinal fusion revision    . Lumbar laminectomy      with revision   History   Social History  . Marital Status: Married    Spouse Name: N/A    Number of Children: N/A  . Years of Education: N/A   Occupational History  . Not on file.   Social History Main  Topics  . Smoking status: Never Smoker   . Smokeless tobacco: Not on file  . Alcohol Use: No  . Drug Use: No  . Sexual Activity: Yes   Other Topics Concern  . Not on file   Social History Narrative  . No narrative on file      Objective:   Physical Exam  BP 114/83  Ht  (1.778 m)  Wt 150 lb (68.04 kg)  BMI 21.52 kg/m2 GEN: The patient is well-developed well-nourished male and in no acute distress.  He is awake alert and oriented x3. SKIN: warm and well-perfused, no rash  EXTR: No lower extremity edema or calf tenderness Neuro: Strength 5/5 globally. Sensation intact throughout. DTRs 2/4 bilaterally. No focal deficits. Vasc: +2 bilateral distal pulses. No edema.  MSK: Examination of the bilateral lower extremities reveals 0.5 cm of cath atrophy at the left. She has fairly decent arch shape. He has weakening of the posterior tibialis tendon with decreased amount of push off with walking or jogging analysis. Negative calcaneal squeeze test. He holds his feet and slight over supination, worse on the left. Slight leg length discrepancy of less than 5 mm right greater than left. He has a slight knee varus deformity on the right. No tenderness at the plantar fascia. No bony tenderness. Negative ankle squeeze test. Negative hop test.    Procedure: Patient was fitted for a : standard, cushioned, semi-rigid orthotic.  The orthotic was  heated and afterward the patient stood on the orthotic blank positioned on the orthotic stand.  The patient was positioned in subtalar neutral position and 10 degrees of ankle dorsiflexion in a weight bearing stance.  After completion of molding, a stable base was applied to the orthotic blank.  The blank was ground to a stable position for weight bearing.  Size: 11 Base: Blue foam Posting: None Additional orthotic padding: None   Assessment & Plan:  Please see problem based assessment and plan in the problem list.  Greater than 50% of this 40  minute visit was spent with face-to-face time with the patient grafting, and fitting, and counseling the patient about his new custom orthotics as well as a series of exercises for posterior tibialis strengthening.

## 2014-01-24 ENCOUNTER — Other Ambulatory Visit: Payer: Self-pay | Admitting: Family

## 2014-01-26 ENCOUNTER — Ambulatory Visit (INDEPENDENT_AMBULATORY_CARE_PROVIDER_SITE_OTHER): Payer: BC Managed Care – PPO | Admitting: Sports Medicine

## 2014-01-26 ENCOUNTER — Encounter: Payer: Self-pay | Admitting: Sports Medicine

## 2014-01-26 VITALS — BP 110/63 | Ht 70.0 in | Wt 147.0 lb

## 2014-01-26 DIAGNOSIS — M76822 Posterior tibial tendinitis, left leg: Secondary | ICD-10-CM

## 2014-01-26 NOTE — Assessment & Plan Note (Addendum)
-  Improved significantly with PTT strengthening exercises, custom orthotics molded Sept 2015. -Added medial post to right orthotic -Continue toe-in heel raises and running along a straight line at least twice weekly to try to correct out-turning right foot -Follow-up if needed

## 2014-01-26 NOTE — Progress Notes (Signed)
   Subjective:    Patient ID: Joseph Santana, male    DOB: 08-31-1955, 58 y.o.   MRN: [id number]  HPI Joseph Santana is a 58 year old male who presents for follow-up of left ankle pain due to posterior tibialis tendonitis and weakness. He has been pain free for the most part, and notes significant improvement since we last saw him. He has been doing his posterior tibialis tendon strengthening exercises as prescribed. He has been running approximately 30 miles per week without pain. He says that the custom orthotics have also helped. He denies any associated swelling, bruising, numbness, or tingling. He is wearing bilateral ankle compression sleeves, which has relieved some of his symptoms. Past medical history is significant for a spinal lumbar fusion with revision surgery.  Past medical history, social history, medications, and allergies were reviewed and are up to date in the chart.  Review of Systems 7 point review of systems was performed and was otherwise negative unless noted in the history of present illness.     Objective:   Physical Exam BP 110/63  Ht 5\' 10"  (1.778 m)  Wt 147 lb (66.679 kg)  BMI 21.09 kg/m2 GEN: The patient is well-developed well-nourished male and in no acute distress.  He is awake alert and oriented x3. SKIN: warm and well-perfused, no rash  EXTR: No lower extremity edema or calf tenderness Neuro: Strength 5/5 globally. Sensation intact throughout. DTRs 2/4 bilaterally. No focal deficits. Vasc: +2 bilateral distal pulses. No edema.  MSK: Examination of the bilateral lower extremities reveals equal calf sizes at 35cm, which is improved compared to prior exam. He has less weakening of the left posterior tibialis tendon with improved push-off during running analysis. Now running with right foot toed out more, however. Slightly decreased push-off timing. Negative calcaneal squeeze test. Less ankle supination. Slight leg length discrepancy of less than 5 mm right greater  than left. He has a slight knee varus deformity on the right. No tenderness at the plantar fascia. No bony tenderness. Negative ankle squeeze test. Negative hop test.     Assessment & Plan:  Please see problem based assessment and plan in the problem list.

## 2014-01-31 ENCOUNTER — Telehealth: Payer: Self-pay | Admitting: Internal Medicine

## 2014-01-31 MED ORDER — TESTOSTERONE CYPIONATE 200 MG/ML IM SOLN: INTRAMUSCULAR | Status: DC

## 2014-01-31 NOTE — Telephone Encounter (Signed)
Pt requesting 90 day refill of testosterone cypionate (DEPOTESTOTERONE CYPIONATE) 200 MG/ML injection.  Ph[facility]acy - [facility]

## 2014-01-31 NOTE — Telephone Encounter (Signed)
Pt informed rx ready for pick up

## 2014-01-31 NOTE — Telephone Encounter (Signed)
Rx ready for pick up. Rx cannot be phoned or faxed. Pt must pick up

## 2014-02-02 ENCOUNTER — Ambulatory Visit: Payer: BC Managed Care – PPO | Admitting: Sports Medicine

## 2014-03-02 ENCOUNTER — Other Ambulatory Visit: Payer: Self-pay | Admitting: Neurosurgery

## 2014-03-02 DIAGNOSIS — S060X0A Concussion without loss of consciousness, initial encounter: Secondary | ICD-10-CM

## 2014-03-04 ENCOUNTER — Ambulatory Visit
Admission: RE | Admit: 2014-03-04 | Discharge: 2014-03-04 | Disposition: A | Discharge status: Home / Self Care | Payer: BC Managed Care – PPO | Source: Ambulatory Visit | Attending: Neurosurgery | Admitting: Neurosurgery

## 2014-03-04 DIAGNOSIS — S060X0A Concussion without loss of consciousness, initial encounter: Secondary | ICD-10-CM

## 2014-03-18 ENCOUNTER — Other Ambulatory Visit (INDEPENDENT_AMBULATORY_CARE_PROVIDER_SITE_OTHER): Payer: BC Managed Care – PPO

## 2014-03-18 ENCOUNTER — Encounter: Payer: Self-pay | Admitting: Internal Medicine

## 2014-03-18 ENCOUNTER — Ambulatory Visit (INDEPENDENT_AMBULATORY_CARE_PROVIDER_SITE_OTHER): Payer: BC Managed Care – PPO | Admitting: Internal Medicine

## 2014-03-18 VITALS — BP 116/74 | HR 63 | Temp 97.4°F | Resp 14 | Ht 70.0 in | Wt 159.0 lb

## 2014-03-18 DIAGNOSIS — Z0000 Encounter for general adult medical examination without abnormal findings: Secondary | ICD-10-CM

## 2014-03-18 DIAGNOSIS — E291 Testicular hypofunction: Secondary | ICD-10-CM

## 2014-03-18 DIAGNOSIS — E785 Hyperlipidemia, unspecified: Secondary | ICD-10-CM

## 2014-03-18 DIAGNOSIS — E039 Hypothyroidism, unspecified: Secondary | ICD-10-CM

## 2014-03-18 NOTE — Progress Notes (Signed)
Pre visit review using our clinic review tool, if applicable. No additional management support is needed unless otherwise documented below in the visit note. 

## 2014-03-18 NOTE — Assessment & Plan Note (Signed)
Patient continues on testosterone injections. Reviewing records he did have true low testosterone levels and has had adequate levels on replacement. No need to check levels at today's visit.

## 2014-03-18 NOTE — Patient Instructions (Signed)
We will check your blood work today and call you back with the results.  Your doing great overall and we can see you back in about 6 months to check on the thyroid. If you have any problems or questions before then please feel free to call our office.  This is a website that shows stretching for your leg.  [email or web address]

## 2014-03-18 NOTE — Assessment & Plan Note (Signed)
Dosing recently decreased to 137 g daily. Check TSH and free T4.

## 2014-03-18 NOTE — Assessment & Plan Note (Signed)
Check lipid panel today. Previously patient has been able to maintain good levels with diet and exercise.

## 2014-03-18 NOTE — Assessment & Plan Note (Signed)
Patient has had colonoscopy however states that the study was inadequate and he was asked to return for additional study. He will do that next year.

## 2014-03-18 NOTE — Progress Notes (Signed)
   Subjective:    Patient ID: Joseph Santana, male    DOB: 01/17/1956, 58 y.o.   MRN: [id number]  HPI The patient is a [age] showed male who comes in today to establish care. He does have past history of hypothyroidism, hypogonadism, hyperlipidemia, back pain and problems. He has been doing very well with exercise and states that he rarely has back pain. He does notice if he goes several days without exercise he starts to get pain and stiffness in his back. He also has some pains in his right knee however has been doing exercise and that seems to help those as well. He takes a rare ibuprofen over-the-counter. His thyroid dosing was decreased several months ago and he wants to make sure that the dosing is correct now.  Review of Systems  Constitutional: Negative for fever, activity change, appetite change, fatigue and unexpected weight change.  HENT: Negative.   Eyes: Negative.   Respiratory: Negative for cough, chest tightness, shortness of breath and wheezing.   Cardiovascular: Negative for chest pain, palpitations and leg swelling.  Gastrointestinal: Negative for nausea, abdominal pain, diarrhea, constipation and abdominal distention.  Musculoskeletal: Negative for myalgias, back pain and arthralgias.  Skin: Negative.   Neurological: Positive for weakness. Negative for dizziness, seizures and headaches.       Occasional weakness in his right leg with pains  Psychiatric/Behavioral: Negative.       Objective:   Physical Exam  Constitutional: He is oriented to person, place, and time. He appears well-developed and well-nourished.  HENT:  Head: Normocephalic and atraumatic.  Eyes: EOM are normal.  Neck: Normal range of motion.  Cardiovascular: Normal rate and regular rhythm.   Pulmonary/Chest: Effort normal and breath sounds normal. No respiratory distress. He has no wheezes. He has no rales.  Abdominal: Soft. Bowel sounds are normal. He exhibits no distension. There is no tenderness. There  is no rebound.  Musculoskeletal: Normal range of motion. He exhibits no edema.  Neurological: He is alert and oriented to person, place, and time. Coordination normal.  Skin: Skin is warm and dry.   Filed Vitals:   03/18/14 1406  BP: 116/74  Pulse: 63  Temp: 97.4 F (36.3 C)  TempSrc: Oral  Resp: 14  Height: 5\' 10"  (1.778 m)  Weight: 159 lb (72.122 kg)  SpO2: 97%      Assessment & Plan:

## 2014-03-19 LAB — TSH: TSH: 1.22 u[IU]/mL (ref 0.35–4.50)

## 2014-03-19 LAB — T4, FREE: FREE T4: 1.01 ng/dL (ref 0.60–1.60)

## 2014-03-20 LAB — LIPID PANEL
CHOLESTEROL: 170 mg/dL (ref 0–200)
HDL: 44.7 mg/dL (ref >39.00)
LDL Cholesterol: 113 mg/dL — ABNORMAL HIGH (ref 0–99)
NonHDL: 125.3
Total CHOL/HDL Ratio: 4
Triglycerides: 63 mg/dL (ref 0.0–149.0)
VLDL: 12.6 mg/dL (ref 0.0–40.0)

## 2014-03-20 LAB — BASIC METABOLIC PANEL
BUN: 14 mg/dL (ref 6–23)
CALCIUM: 9.1 mg/dL (ref 8.4–10.5)
CO2: 30 mEq/L (ref 19–32)
CREATININE: 0.8 mg/dL (ref 0.4–1.5)
Chloride: 103 mEq/L (ref 96–112)
GFR: 100.39 mL/min (ref >60.00)
Glucose, Bld: 68 mg/dL — ABNORMAL LOW (ref 70–99)
Potassium: 4.2 mEq/L (ref 3.5–5.1)
Sodium: 140 mEq/L (ref 135–145)

## 2014-03-30 ENCOUNTER — Telehealth: Payer: Self-pay | Admitting: Internal Medicine

## 2014-03-30 NOTE — Telephone Encounter (Signed)
Pt rec'd results of thyroid labs. He is questioning the dosage of his thyroid medicine, he thinks the dosage still may be too low as his lab was in the low end of the normal range (per pt).

## 2014-03-31 NOTE — Telephone Encounter (Signed)
The free T4 is the number that tells us how much thyroid hormone. This is right in the middle of the range and consistent with his previous values. No change needed with his medication.

## 2014-03-31 NOTE — Telephone Encounter (Signed)
I spoke with patient and informed him that no changes need to be made with his thyroid medication.

## 2014-04-03 ENCOUNTER — Telehealth: Payer: Self-pay | Admitting: Internal Medicine

## 2014-04-04 ENCOUNTER — Other Ambulatory Visit: Payer: Self-pay | Admitting: Geriatric Medicine

## 2014-04-04 MED ORDER — LEVOTHYROXINE SODIUM 137 MCG PO TABS: ORAL_TABLET | ORAL | Status: DC

## 2014-04-04 NOTE — Telephone Encounter (Signed)
Pt called in requesting refill on thyroid meds  [facility] [address]

## 2014-04-04 NOTE — Telephone Encounter (Signed)
Sent to pharmacy 

## 2014-08-06 ENCOUNTER — Telehealth: Payer: Self-pay | Admitting: Internal Medicine

## 2014-08-06 ENCOUNTER — Encounter: Payer: Self-pay | Admitting: Internal Medicine

## 2014-08-06 DIAGNOSIS — E785 Hyperlipidemia, unspecified: Secondary | ICD-10-CM

## 2014-08-08 ENCOUNTER — Other Ambulatory Visit: Payer: Self-pay | Admitting: Geriatric Medicine

## 2014-08-08 MED ORDER — LEVOTHYROXINE SODIUM 137 MCG PO TABS: ORAL_TABLET | ORAL | Status: DC

## 2014-08-09 ENCOUNTER — Other Ambulatory Visit: Payer: Self-pay | Admitting: Geriatric Medicine

## 2014-08-09 ENCOUNTER — Telehealth: Payer: Self-pay | Admitting: Internal Medicine

## 2014-08-09 MED ORDER — LEVOTHYROXINE SODIUM 137 MCG PO TABS: ORAL_TABLET | ORAL | Status: DC

## 2014-08-09 NOTE — Telephone Encounter (Signed)
Sent to pharmacy 

## 2014-08-09 NOTE — Telephone Encounter (Signed)
Can you please send in 1 year supply of his synthroid.

## 2014-08-09 NOTE — Telephone Encounter (Signed)
I spoke with patient and clarified his questions. Medications that he does not take include montelukast, levocetirizine, and flaxseed oil. They will be removed from his chart at his next office visit by Dr. Dorise HissKollar.

## 2014-08-09 NOTE — Telephone Encounter (Signed)
Patient would like a call back.  He wants to review his med list b/c there is medicine showing on his med list on MyChart that he does not take.  Did inform patient that we update at next appointment.  Patient does not want to wait for his next appointment to update.  Please give a call back in regards.

## 2014-08-10 ENCOUNTER — Encounter: Payer: Self-pay | Admitting: Internal Medicine

## 2014-11-29 ENCOUNTER — Other Ambulatory Visit: Payer: Self-pay

## 2014-11-29 MED ORDER — LEVOTHYROXINE SODIUM 137 MCG PO TABS: 137.0000 ug | ORAL_TABLET | Freq: Every day | ORAL | Status: DC

## 2015-01-27 ENCOUNTER — Ambulatory Visit: Payer: Self-pay

## 2015-06-05 ENCOUNTER — Other Ambulatory Visit: Payer: Self-pay | Admitting: Geriatric Medicine

## 2015-06-05 ENCOUNTER — Telehealth: Payer: Self-pay | Admitting: Internal Medicine

## 2015-06-05 MED ORDER — LEVOTHYROXINE SODIUM 137 MCG PO TABS: 137.0000 ug | ORAL_TABLET | Freq: Every day | ORAL | Status: DC

## 2015-06-05 NOTE — Telephone Encounter (Signed)
Pt requesting refill for levothyroxine (SYNTHROID, LEVOTHROID) 137 MCG tablet [[id number]]  [facility] [address]

## 2015-06-05 NOTE — Telephone Encounter (Signed)
Sent to pharmacy 

## 2015-06-08 ENCOUNTER — Ambulatory Visit (INDEPENDENT_AMBULATORY_CARE_PROVIDER_SITE_OTHER): Payer: BLUE CROSS/BLUE SHIELD | Admitting: Sports Medicine

## 2015-06-08 ENCOUNTER — Encounter: Payer: Self-pay | Admitting: Sports Medicine

## 2015-06-08 VITALS — BP 102/78 | Ht 70.0 in | Wt 150.0 lb

## 2015-06-08 DIAGNOSIS — M5431 Sciatica, right side: Secondary | ICD-10-CM | POA: Diagnosis not present

## 2015-06-08 DIAGNOSIS — R269 Unspecified abnormalities of gait and mobility: Secondary | ICD-10-CM | POA: Diagnosis not present

## 2015-06-08 NOTE — Assessment & Plan Note (Signed)
We prepared new custom orthotics  Patient was fitted for a : standard, cushioned, semi-rigid orthotic. The orthotic was heated and afterward the patient stood on the orthotic blank positioned on the orthotic stand. The patient was positioned in subtalar neutral position and 10 degrees of ankle dorsiflexion in a weight bearing stance. After completion of molding, a stable base was applied to the orthotic blank. The blank was ground to a stable position for weight bearing. Size: 11 red EVA Base: blue EVA Posting: none Additional orthotic padding: none  Post orthotic gait Able to reduce turnout to 8 deg. Good control of pronation  I spent 45 mins face to face with greater half devoted to discussion of gait issues and compensation for his lumbar radiculopathy.

## 2015-06-08 NOTE — Progress Notes (Signed)
  Joseph Santana - 60 y.o. male MRN [id number]  Date of birth: 20-Apr-1955 Joseph Santana is a 60 y.o. male who presents today for orthotic replacement  HPI Patient had chronic foot and leg pain when he came to see me for orthotics fall of 2015 Primarily down RT leg and periodic sciatica He began a running program and this helped back pain but increased leg pain Since orthotics -- back pain is minimal/ leg pain has not recurred  Since these are wearing  He came to see about a new pair  PMHx - Updated and reviewed.  Contributory factors include: Hypothyroidism (on Synthroid), Lumbar Radiculopathy (since 2008) PSHx - Updated and reviewed.  Contributory factors include:  Lumbar Laminectomy (2008), Spinal fusion (2011), Spinal fusion revision (2012) FHx - Updated and reviewed.  Contributory factors include:  N/A Social Hx - Updated and reviewed. Contributory factors include: Active runner. Runs about 40 miles a week; works in [employer] Medications - Synthroid 137 mcg tab daily, Xyzal 5 mg, Singulair 10 mg, depotestoterone cypionate 200 mg/ml injection   ROS Per HPI.  12 point negative other than per HPI.   Exam:  Filed Vitals:   06/08/15 0919  BP: 102/78   Gen: NAD, AAO 3 BP 102/78 mmHg  Ht  (1.778 m)  Wt 150 lb (68.04 kg)  BMI 21.52 kg/m2  Cardio- RRR Pulm - Normal respiratory effort/rate Skin: No rashes or erythema Extremities: No edema  Vascular: pulses +2 bilateral upper and lower extremity Psych: Normal affect   Feet show mod high arch ER of RT foot No abnl calluses  Gait is abnormal with turnout of RT foot that creates pronation This is about 15 deg   Imaging:  None done today

## 2015-06-08 NOTE — Assessment & Plan Note (Signed)
S/p Disk surgery 2008 S/P lumbar 2 level fusion 2012  Some persistent sciatica  This is improved with orthotics and with strength training Rarely uses meds

## 2015-07-18 DIAGNOSIS — J3089 Other allergic rhinitis: Secondary | ICD-10-CM | POA: Diagnosis not present

## 2015-07-18 DIAGNOSIS — J301 Allergic rhinitis due to pollen: Secondary | ICD-10-CM | POA: Diagnosis not present

## 2015-07-24 DIAGNOSIS — J301 Allergic rhinitis due to pollen: Secondary | ICD-10-CM | POA: Diagnosis not present

## 2015-07-25 DIAGNOSIS — S0502XA Injury of conjunctiva and corneal abrasion without foreign body, left eye, initial encounter: Secondary | ICD-10-CM | POA: Diagnosis not present

## 2015-07-25 DIAGNOSIS — J3089 Other allergic rhinitis: Secondary | ICD-10-CM | POA: Diagnosis not present

## 2015-07-25 DIAGNOSIS — J301 Allergic rhinitis due to pollen: Secondary | ICD-10-CM | POA: Diagnosis not present

## 2015-08-01 DIAGNOSIS — J301 Allergic rhinitis due to pollen: Secondary | ICD-10-CM | POA: Diagnosis not present

## 2015-08-01 DIAGNOSIS — J3089 Other allergic rhinitis: Secondary | ICD-10-CM | POA: Diagnosis not present

## 2015-08-08 DIAGNOSIS — J3089 Other allergic rhinitis: Secondary | ICD-10-CM | POA: Diagnosis not present

## 2015-08-08 DIAGNOSIS — J301 Allergic rhinitis due to pollen: Secondary | ICD-10-CM | POA: Diagnosis not present

## 2015-08-14 DIAGNOSIS — J301 Allergic rhinitis due to pollen: Secondary | ICD-10-CM | POA: Diagnosis not present

## 2015-08-14 DIAGNOSIS — J3089 Other allergic rhinitis: Secondary | ICD-10-CM | POA: Diagnosis not present

## 2015-08-21 DIAGNOSIS — J301 Allergic rhinitis due to pollen: Secondary | ICD-10-CM | POA: Diagnosis not present

## 2015-08-21 DIAGNOSIS — J3089 Other allergic rhinitis: Secondary | ICD-10-CM | POA: Diagnosis not present

## 2015-08-28 DIAGNOSIS — J3089 Other allergic rhinitis: Secondary | ICD-10-CM | POA: Diagnosis not present

## 2015-08-28 DIAGNOSIS — J301 Allergic rhinitis due to pollen: Secondary | ICD-10-CM | POA: Diagnosis not present

## 2015-09-04 DIAGNOSIS — J3089 Other allergic rhinitis: Secondary | ICD-10-CM | POA: Diagnosis not present

## 2015-09-04 DIAGNOSIS — J301 Allergic rhinitis due to pollen: Secondary | ICD-10-CM | POA: Diagnosis not present

## 2015-09-12 DIAGNOSIS — J301 Allergic rhinitis due to pollen: Secondary | ICD-10-CM | POA: Diagnosis not present

## 2015-09-12 DIAGNOSIS — J3089 Other allergic rhinitis: Secondary | ICD-10-CM | POA: Diagnosis not present

## 2015-09-18 DIAGNOSIS — J3089 Other allergic rhinitis: Secondary | ICD-10-CM | POA: Diagnosis not present

## 2015-09-18 DIAGNOSIS — J301 Allergic rhinitis due to pollen: Secondary | ICD-10-CM | POA: Diagnosis not present

## 2015-10-02 DIAGNOSIS — J3089 Other allergic rhinitis: Secondary | ICD-10-CM | POA: Diagnosis not present

## 2015-10-02 DIAGNOSIS — J301 Allergic rhinitis due to pollen: Secondary | ICD-10-CM | POA: Diagnosis not present

## 2015-10-09 DIAGNOSIS — J301 Allergic rhinitis due to pollen: Secondary | ICD-10-CM | POA: Diagnosis not present

## 2015-10-09 DIAGNOSIS — J3089 Other allergic rhinitis: Secondary | ICD-10-CM | POA: Diagnosis not present

## 2015-10-15 ENCOUNTER — Other Ambulatory Visit: Payer: Self-pay | Admitting: Family

## 2015-10-18 ENCOUNTER — Encounter: Payer: Self-pay | Admitting: Internal Medicine

## 2015-10-18 ENCOUNTER — Other Ambulatory Visit (INDEPENDENT_AMBULATORY_CARE_PROVIDER_SITE_OTHER): Payer: BLUE CROSS/BLUE SHIELD

## 2015-10-18 ENCOUNTER — Ambulatory Visit (INDEPENDENT_AMBULATORY_CARE_PROVIDER_SITE_OTHER): Payer: BLUE CROSS/BLUE SHIELD | Admitting: Internal Medicine

## 2015-10-18 ENCOUNTER — Other Ambulatory Visit: Payer: Self-pay | Admitting: Internal Medicine

## 2015-10-18 ENCOUNTER — Ambulatory Visit: Payer: BLUE CROSS/BLUE SHIELD | Admitting: Internal Medicine

## 2015-10-18 VITALS — BP 120/72 | HR 50 | Temp 98.0°F | Resp 20 | Wt 152.0 lb

## 2015-10-18 DIAGNOSIS — E291 Testicular hypofunction: Secondary | ICD-10-CM

## 2015-10-18 DIAGNOSIS — L989 Disorder of the skin and subcutaneous tissue, unspecified: Secondary | ICD-10-CM | POA: Insufficient documentation

## 2015-10-18 DIAGNOSIS — J301 Allergic rhinitis due to pollen: Secondary | ICD-10-CM | POA: Diagnosis not present

## 2015-10-18 DIAGNOSIS — J3089 Other allergic rhinitis: Secondary | ICD-10-CM | POA: Diagnosis not present

## 2015-10-18 DIAGNOSIS — Z0000 Encounter for general adult medical examination without abnormal findings: Secondary | ICD-10-CM

## 2015-10-18 DIAGNOSIS — E039 Hypothyroidism, unspecified: Secondary | ICD-10-CM

## 2015-10-18 DIAGNOSIS — E785 Hyperlipidemia, unspecified: Secondary | ICD-10-CM | POA: Diagnosis not present

## 2015-10-18 LAB — T4, FREE: Free T4: 0.9 ng/dL (ref 0.60–1.60)

## 2015-10-18 LAB — TSH: TSH: 4.88 u[IU]/mL — ABNORMAL HIGH (ref 0.35–4.50)

## 2015-10-18 MED ORDER — LEVOTHYROXINE SODIUM 150 MCG PO TABS: 150.0000 ug | ORAL_TABLET | Freq: Every day | ORAL | Status: DC

## 2015-10-18 MED ORDER — TRIAMCINOLONE ACETONIDE 0.1 % EX CREA: 1.0000 | TOPICAL_CREAM | Freq: Two times a day (BID) | CUTANEOUS | Status: DC

## 2015-10-18 NOTE — Progress Notes (Signed)
Subjective:    Patient ID: Joseph Santana, male    DOB: 02-17-1956, 60 y.o.   MRN: [id number]  HPI  Here with lump to anteroir neck at the larynx level just off center to left,, started Sun July 2, began as warm, red, raised lump that was not overly painful but some discomfort and itchy, as if maybe was some sort of bite, had been working in the yard; no change in size, no fever, red streaks, other swelling, other neck mass, and overall actually today some improved with less heat/warmth, just still red swelling. Pt denies chest pain, increased sob or doe, wheezing, orthopnea, PND, increased LE swelling, palpitations, dizziness or syncope.  Has only been using the testosterone intermittently lately.  Denies hyper or hypo thyroid symptoms such as voice, skin or hair change. Past Medical History  Diagnosis Date  . ALLERGIC REACTION 07/22/2009  . ALLERGIC RHINITIS 10/08/2006  . B12 DEFICIENCY 05/14/2010  . Carpal tunnel syndrome 12/31/2007  . Degeneration of cervical intervertebral disc 12/31/2007  . HYPERLIPIDEMIA 10/08/2006  . HYPOTHYROIDISM 10/08/2006  . Irritable bowel syndrome 03/03/2008  . LUMBAR RADICULOPATHY, RIGHT 12/31/2006  . OSTEOARTHRITIS, LOWER LEG, LEFT 05/10/2009  . SINUSITIS - ACUTE-NOS 07/22/2009  . TESTICULAR HYPOFUNCTION 06/26/2009   Past Surgical History  Procedure Laterality Date  . Knee surgery    . Spinal fusion  2011  . Spinal fusion revision  2012  . Lumbar laminectomy  2008    with revision    reports that he has never smoked. He does not have any smokeless tobacco history on file. He reports that he does not drink alcohol or use illicit drugs. family history is not on file. Allergies  Allergen Reactions  . Iodine   . Levofloxacin    Current Outpatient Prescriptions on File Prior to Visit  Medication Sig Dispense Refill  . B-D 3CC LUER-LOK SYR 21GX1" 21G X 1" 3 ML MISC USE AS DIRECTED 25 each 0  . levothyroxine (SYNTHROID, LEVOTHROID) 137 MCG tablet Take 1 tablet  (137 mcg total) by mouth daily before breakfast. 90 tablet 1  . Multiple Vitamin (MULTIVITAMIN) capsule Take 1 capsule by mouth daily.      Marland Kitchen. testosterone cypionate (DEPOTESTOTERONE CYPIONATE) 200 MG/ML injection INJECT 0.5MLS EVERY 14 DAYS 10 mL 1   No current facility-administered medications on file prior to visit.   Review of Systems  Constitutional: Negative for unusual diaphoresis or night sweats HENT: Negative for ear swelling or discharge Eyes: Negative for worsening visual haziness  Respiratory: Negative for choking and stridor.   Gastrointestinal: Negative for distension or worsening eructation Genitourinary: Negative for retention or change in urine volume.  Musculoskeletal: Negative for other MSK pain or swelling Skin: Negative for color change and worsening wound Neurological: Negative for tremors and numbness other than noted  Psychiatric/Behavioral: Negative for decreased concentration or agitation other than above       Objective:   Physical Exam BP 120/72 mmHg  Pulse 50  Temp(Src) 98 F (36.7 C) (Oral)  Resp 20  Wt 152 lb (68.947 kg)  SpO2 98% VS noted,  Constitutional: Pt appears in no apparent distress HENT: Head: NCAT.  Right Ear: External ear normal.  Left Ear: External ear normal.  Eyes: . Pupils are equal, round, and reactive to light. Conjunctivae and EOM are normal Neck: Normal range of motion. Neck supple. but with 1/2 cm slightly raised mild tender erythem area just lateral to larynx, without fluctuance or drainage Cardiovascular: Normal rate and regular rhythm.  Pulmonary/Chest: Effort normal and breath sounds without rales or wheezing.  Abd:  Soft, NT, ND, + BS Neurological: Pt is alert. Not confused , motor grossly intact Skin: Skin is warm. No rash, no LE edema Psychiatric: Pt behavior is normal. No agitation.   Lab Results  Component Value Date   WBC 7.6 08/24/2010   HGB 14.9 08/24/2010   HCT 43.8 08/24/2010   PLT 296 08/24/2010    GLUCOSE 68* 03/18/2014   CHOL 170 03/18/2014   TRIG 63.0 03/18/2014   HDL 44.70 03/18/2014   LDLDIRECT 146.2 11/19/2012   LDLCALC 113* 03/18/2014   ALT 31 03/09/2012   AST 27 03/09/2012   NA 140 03/18/2014   K 4.2 03/18/2014   CL 103 03/18/2014   CREATININE 0.8 03/18/2014   BUN 14 03/18/2014   CO2 30 03/18/2014   TSH 1.22 03/18/2014   PSA 0.78 03/04/2006   Most recent head CT summary: IMPRESSION: Mild cerebellar atrophy. Otherwise negative exam. No acute intracranial findings.   Electronically Signed  By: Davonna BellingJohn Curnes M.D.  On: 03/04/2014 12:32  Most recent C-spine MRI summary: IMPRESSION: C4-5 small central protrusion. Minimal cord contact.  C5-6 small broad-based disc osteophyte complex. Mild spinal stenosis with minimal cord contact on the left. Uncinate bony overgrowth greater on the right. Moderate right-sided foraminal narrowing.  C6-7 shallow broad-based right posterior lateral disc protrusion. Minimal right-sided cord contact. Minimal right foraminal narrowing.  Original Report Authenticated By: Fuller CanadaSTEVEN R. OLSON, M.D.    Assessment & Plan:

## 2015-10-18 NOTE — Progress Notes (Signed)
Pre visit review using our clinic review tool, if applicable. No additional management support is needed unless otherwise documented below in the visit note. 

## 2015-10-18 NOTE — Patient Instructions (Signed)
Please take all new medication as prescribed - the cream for the red area  Please continue all other medications as before, including regular use of the testosterone  Please have the pharmacy call with any other refills you may need.  Please keep your appointments with your specialists as you may have planned  Please go to the LAB in the Basement (turn left off the elevator) for the tests to be done today - just the thyroid testing today  You will be contacted by phone if any changes need to be made immediately.  Otherwise, you will receive a letter about your results with an explanation, but please check with MyChart first.  Please remember to sign up for MyChart if you have not done so, as this will be important to you in the future with finding out test results, communicating by private email, and scheduling acute appointments online when needed.  Please return in 6 months, or sooner if needed, with Lab testing done 3-5 days before, to Dr Okey Duprerawford   Please return in 6 months, or sooner if needed, with Lab testing done 3-5 days before

## 2015-10-20 ENCOUNTER — Telehealth: Payer: Self-pay

## 2015-10-20 NOTE — Telephone Encounter (Signed)
Patient called and was here last week seeing Dr. Jonny RuizJohn about a lump on this neck.. You have not seen him since Dec. But he wanting a referral for a Endocrinology to see Dr. Everardo AllEllison. I informed him he may have to come in and be seen since you haven't seen him in a while. Please advise or follow up, Thank you.

## 2015-10-20 NOTE — Telephone Encounter (Signed)
I would need to see to place since I have not seen since Dec 2015. He may not need referral and can just call Dr. George HughEllison's office for [employer].

## 2015-10-22 NOTE — Assessment & Plan Note (Signed)
C/w dermatitis, for topical steroid,  to f/u any worsening symptoms or concerns

## 2015-10-22 NOTE — Assessment & Plan Note (Signed)
stable overall by history and exam, recent data reviewed with pt, and pt to continue medical treatment as before,  to f/u any worsening symptoms or concerns, for fu TSH today per pt reqeust

## 2015-10-22 NOTE — Assessment & Plan Note (Signed)
Encouraged pt to take med regularly for best results,  to f/u any worsening symptoms or concerns

## 2015-10-22 NOTE — Assessment & Plan Note (Signed)
D/w pt,  Lab Results  Component Value Date   LDLCALC 113* 03/18/2014   For lower chol diet, f/u lipids with PCP

## 2015-10-25 ENCOUNTER — Ambulatory Visit (INDEPENDENT_AMBULATORY_CARE_PROVIDER_SITE_OTHER): Payer: BLUE CROSS/BLUE SHIELD | Admitting: Endocrinology

## 2015-10-25 ENCOUNTER — Encounter: Payer: Self-pay | Admitting: Endocrinology

## 2015-10-25 VITALS — BP 110/62 | HR 54 | Ht 70.0 in | Wt 152.0 lb

## 2015-10-25 DIAGNOSIS — E038 Other specified hypothyroidism: Secondary | ICD-10-CM | POA: Diagnosis not present

## 2015-10-25 DIAGNOSIS — L723 Sebaceous cyst: Secondary | ICD-10-CM | POA: Diagnosis not present

## 2015-10-25 DIAGNOSIS — J3089 Other allergic rhinitis: Secondary | ICD-10-CM | POA: Diagnosis not present

## 2015-10-25 DIAGNOSIS — J301 Allergic rhinitis due to pollen: Secondary | ICD-10-CM | POA: Diagnosis not present

## 2015-10-25 DIAGNOSIS — T63441A Toxic effect of venom of bees, accidental (unintentional), initial encounter: Secondary | ICD-10-CM | POA: Diagnosis not present

## 2015-10-25 DIAGNOSIS — Z91013 Allergy to seafood: Secondary | ICD-10-CM | POA: Diagnosis not present

## 2015-10-25 DIAGNOSIS — E063 Autoimmune thyroiditis: Secondary | ICD-10-CM

## 2015-10-25 NOTE — Progress Notes (Signed)
Patient ID: Joseph Santana, male   DOB: [birth year], 60 y.o.   MRN: [id number]            Reason for Appointment:  Hypothyroidism, new visit    History of Present Illness:   Hypothyroidism was first diagnosed  about 30 years ago  At the time of diagnosis patient was having symptoms of  fatigue but he is not sure about all the other symptoms Most probably tested also because of strong family history of thyroid disease  The patient has been treated with  levothyroxine in various doses He had been on 137 g for some time and was doing subjectively fairly good until recently. More recently he had been having more complaints of feeling fatigued and having some cold intolerance which she thought was from some weight loss His weight appears to be the same this year   He takes his thyroid supplement consistently in the morning but takes it right before eating and also combines it with a Centrum vitamin that he takes daily and has been for some time  Since TSH was high at 4.9 his dose was increased to 150 g this month         Patient's weight history is as follows:  Wt Readings from Last 3 Encounters:  10/25/15 152 lb (68.947 kg)  10/18/15 152 lb (68.947 kg)  06/08/15 150 lb (68.04 kg)    However the patient is more concerned about a knot on his neck and any relationship to thyroid nodules He was told by his PCP that this was an inflamed skin lesion and given steroid ointment which is not helping much  Thyroid function results have been as follows:  Lab Results  Component Value Date   FREET4 0.90 10/18/2015   FREET4 1.01 03/18/2014   FREET4 1.01 04/17/2011   TSH 4.88* 10/18/2015   TSH 1.22 03/18/2014   TSH 0.62 10/18/2013     Past Medical History  Diagnosis Date  . ALLERGIC REACTION 07/22/2009  . ALLERGIC RHINITIS 10/08/2006  . B12 DEFICIENCY 05/14/2010  . Carpal tunnel syndrome 12/31/2007  . Degeneration of cervical intervertebral disc 12/31/2007  . HYPERLIPIDEMIA 10/08/2006    . HYPOTHYROIDISM 10/08/2006  . Irritable bowel syndrome 03/03/2008  . LUMBAR RADICULOPATHY, RIGHT 12/31/2006  . OSTEOARTHRITIS, LOWER LEG, LEFT 05/10/2009  . SINUSITIS - ACUTE-NOS 07/22/2009  . TESTICULAR HYPOFUNCTION 06/26/2009    Past Surgical History  Procedure Laterality Date  . Knee surgery    . Spinal fusion  2011  . Spinal fusion revision  2012  . Lumbar laminectomy  2008    with revision    Family History  Problem Relation Age of Onset  . Asthma    . Diabetes    . Allergy (severe)    . Hypothyroidism Mother   . Hypothyroidism Father   . Hypothyroidism Sister   . Hypothyroidism Brother     Social History:  reports that he has never smoked. He does not have any smokeless tobacco history on file. He reports that he does not drink alcohol or use illicit drugs.  Allergies:  Allergies  Allergen Reactions  . Iodine   . Levofloxacin       Medication List       This list is accurate as of: 10/25/15  1:09 PM.  Always use your most recent med list.               B-D 3CC LUER-LOK SYR 21GX1" 21G X 1" 3 ML Misc  Generic  drug:  SYRINGE-NEEDLE (DISP) 3 ML  USE AS DIRECTED     levothyroxine 150 MCG tablet  Commonly known as:  SYNTHROID, LEVOTHROID  Take 1 tablet (150 mcg total) by mouth daily before breakfast.     multivitamin capsule  Take 1 capsule by mouth daily.     testosterone cypionate 200 MG/ML injection  Commonly known as:  DEPOTESTOSTERONE CYPIONATE  INJECT 0.5MLS EVERY 14 DAYS     triamcinolone cream 0.1 %  Commonly known as:  KENALOG  Apply 1 application topically 2 (two) times daily.        Review of Systems  Constitutional: Negative for reduced appetite.  HENT: Negative for hoarseness and trouble swallowing.   Respiratory: Negative for daytime sleepiness.   Cardiovascular: Negative for leg swelling.  Gastrointestinal: Negative for constipation.  Endocrine: Positive for fatigue.  Neurological: Negative for weakness.        He has been  previously treated with testosterone injections         Examination:    BP 110/62 mmHg  Pulse 54  Ht 5\' 10"  (1.778 m)  Wt 152 lb (68.947 kg)  BMI 21.81 kg/m2  SpO2 98%  GENERAL:  Average build.   No pallor   Skin:  no rash or pigmentation.  EYES:  No prominence of the eyes or swelling of the eyelids  ENT: Exam not indicated  NECK exam: He has a 1 cm slightly reddish subcutaneous nodule just left of the midline over the larynx area  THYROID:  Not palpable.  There is no nodule on the thyroid  HEART:  Exam not indicated  ABDOMEN: Exam not indicated  NEUROLOGICAL: Reflexes are normal bilaterally at biceps.  JOINTS:  Normal.   Assessment:  HYPOTHYROIDISM, long-standing Recently appears to have become somewhat more hypothyroid with relatively high TSH Not clear how much of the higher TSH is related to his taking interacting vitamin/calcium in his Centrum. However he has been using this regimen previously also With his TSH being only slightly high the dose of 150 g is appropriate  Sebaceous cyst on mid neck area, probably has mild infection. Reassured him that this is not a thyroid nodule and he does not need to be concerned about this  PLAN:   Follow-up with PCP for thyroid He also will continue to follow-up with PCP for other medical issues including history of hypogonadism  He can try OTC triple antibiotic for his sebaceous cyst, can see his dermatologist if not improved   [name] 10/25/2015, 1:09 PM

## 2015-11-01 DIAGNOSIS — J301 Allergic rhinitis due to pollen: Secondary | ICD-10-CM | POA: Diagnosis not present

## 2015-11-01 DIAGNOSIS — J3089 Other allergic rhinitis: Secondary | ICD-10-CM | POA: Diagnosis not present

## 2015-11-06 DIAGNOSIS — J301 Allergic rhinitis due to pollen: Secondary | ICD-10-CM | POA: Diagnosis not present

## 2015-11-06 DIAGNOSIS — J3089 Other allergic rhinitis: Secondary | ICD-10-CM | POA: Diagnosis not present

## 2015-11-13 DIAGNOSIS — J301 Allergic rhinitis due to pollen: Secondary | ICD-10-CM | POA: Diagnosis not present

## 2015-11-13 DIAGNOSIS — J3089 Other allergic rhinitis: Secondary | ICD-10-CM | POA: Diagnosis not present

## 2015-11-20 DIAGNOSIS — J3089 Other allergic rhinitis: Secondary | ICD-10-CM | POA: Diagnosis not present

## 2015-11-20 DIAGNOSIS — J301 Allergic rhinitis due to pollen: Secondary | ICD-10-CM | POA: Diagnosis not present

## 2015-11-26 ENCOUNTER — Other Ambulatory Visit: Payer: Self-pay | Admitting: Internal Medicine

## 2015-11-27 DIAGNOSIS — J301 Allergic rhinitis due to pollen: Secondary | ICD-10-CM | POA: Diagnosis not present

## 2015-11-27 DIAGNOSIS — J3089 Other allergic rhinitis: Secondary | ICD-10-CM | POA: Diagnosis not present

## 2015-12-12 DIAGNOSIS — J3089 Other allergic rhinitis: Secondary | ICD-10-CM | POA: Diagnosis not present

## 2015-12-12 DIAGNOSIS — J301 Allergic rhinitis due to pollen: Secondary | ICD-10-CM | POA: Diagnosis not present

## 2015-12-25 DIAGNOSIS — J3089 Other allergic rhinitis: Secondary | ICD-10-CM | POA: Diagnosis not present

## 2015-12-25 DIAGNOSIS — J301 Allergic rhinitis due to pollen: Secondary | ICD-10-CM | POA: Diagnosis not present

## 2015-12-28 ENCOUNTER — Other Ambulatory Visit (INDEPENDENT_AMBULATORY_CARE_PROVIDER_SITE_OTHER): Payer: BLUE CROSS/BLUE SHIELD

## 2015-12-28 ENCOUNTER — Encounter: Payer: Self-pay | Admitting: Internal Medicine

## 2015-12-28 ENCOUNTER — Ambulatory Visit (INDEPENDENT_AMBULATORY_CARE_PROVIDER_SITE_OTHER): Payer: BLUE CROSS/BLUE SHIELD | Admitting: Internal Medicine

## 2015-12-28 VITALS — BP 136/82 | HR 56 | Temp 98.4°F | Resp 14 | Ht 70.0 in | Wt 152.0 lb

## 2015-12-28 DIAGNOSIS — Z23 Encounter for immunization: Secondary | ICD-10-CM

## 2015-12-28 DIAGNOSIS — E039 Hypothyroidism, unspecified: Secondary | ICD-10-CM

## 2015-12-28 DIAGNOSIS — Z1159 Encounter for screening for other viral diseases: Secondary | ICD-10-CM | POA: Diagnosis not present

## 2015-12-28 DIAGNOSIS — E291 Testicular hypofunction: Secondary | ICD-10-CM

## 2015-12-28 DIAGNOSIS — E785 Hyperlipidemia, unspecified: Secondary | ICD-10-CM

## 2015-12-28 LAB — COMPREHENSIVE METABOLIC PANEL
ALBUMIN: 4.4 g/dL (ref 3.5–5.2)
ALT: 35 U/L (ref 0–53)
AST: 28 U/L (ref 0–37)
Alkaline Phosphatase: 69 U/L (ref 39–117)
BUN: 17 mg/dL (ref 6–23)
CHLORIDE: 101 meq/L (ref 96–112)
CO2: 34 meq/L — AB (ref 19–32)
CREATININE: 0.87 mg/dL (ref 0.40–1.50)
Calcium: 9.4 mg/dL (ref 8.4–10.5)
GFR: 95.8 mL/min (ref >60.00)
GLUCOSE: 71 mg/dL (ref 70–99)
POTASSIUM: 4.4 meq/L (ref 3.5–5.1)
SODIUM: 140 meq/L (ref 135–145)
Total Bilirubin: 0.6 mg/dL (ref 0.2–1.2)
Total Protein: 7.3 g/dL (ref 6.0–8.3)

## 2015-12-28 LAB — LIPID PANEL
CHOL/HDL RATIO: 2
Cholesterol: 174 mg/dL (ref 0–200)
HDL: 71 mg/dL (ref >39.00)
LDL CALC: 94 mg/dL (ref 0–99)
NONHDL: 103.08
Triglycerides: 45 mg/dL (ref 0.0–149.0)
VLDL: 9 mg/dL (ref 0.0–40.0)

## 2015-12-28 NOTE — Patient Instructions (Signed)
We will check the labs today and call you back about the results.   You can schedule the physical on the way out.

## 2015-12-28 NOTE — Progress Notes (Signed)
   Subjective:    Patient ID: Joseph Santana, male    DOB: 05-25-1955, 60 y.o.   MRN: [id number]  HPI The patient is a 60 YO man coming in for follow up of his hypothyroidism. He is needing labs. Taking synthroid 150 mcg daily without side effects. Exercising daily which aggravates his allergies. Has not been seen for about 2 years and recently saw endo for the thyroid. Also needs labs.   Review of Systems  Constitutional: Negative for activity change, appetite change, fatigue, fever and unexpected weight change.  HENT: Negative.   Eyes: Negative.   Respiratory: Negative for cough, chest tightness, shortness of breath and wheezing.   Cardiovascular: Negative for chest pain, palpitations and leg swelling.  Gastrointestinal: Negative for abdominal distention, abdominal pain, constipation, diarrhea and nausea.  Musculoskeletal: Negative for arthralgias, back pain and myalgias.  Skin: Negative.   Neurological: Negative for dizziness, seizures and headaches.  Psychiatric/Behavioral: Negative.       Objective:   Physical Exam  Constitutional: He is oriented to person, place, and time. He appears well-developed and well-nourished.  HENT:  Head: Normocephalic and atraumatic.  Eyes: EOM are normal.  Neck: Normal range of motion.  Cardiovascular: Normal rate and regular rhythm.   Pulmonary/Chest: Effort normal and breath sounds normal. No respiratory distress. He has no wheezes. He has no rales.  Abdominal: Soft. Bowel sounds are normal. He exhibits no distension. There is no tenderness. There is no rebound.  Musculoskeletal: Normal range of motion. He exhibits no edema.  Neurological: He is alert and oriented to person, place, and time. Coordination normal.  Skin: Skin is warm and dry.   Vitals:   12/28/15 1348  BP: 136/82  Pulse: (!) 56  Resp: 14  Temp: 98.4 F (36.9 C)  TempSrc: Oral  SpO2: 99%  Weight: 152 lb (68.9 kg)  Height: 5\' 10"  (1.778 m)      Assessment & Plan:  Tdap  and flu given at visit.

## 2015-12-28 NOTE — Progress Notes (Signed)
Pre visit review using our clinic review tool, if applicable. No additional management support is needed unless otherwise documented below in the visit note. 

## 2015-12-29 ENCOUNTER — Encounter: Payer: Self-pay | Admitting: Internal Medicine

## 2015-12-29 LAB — TESTOSTERONE TOTAL,FREE,BIO, MALES
ALBUMIN: 4.5 g/dL (ref 3.6–5.1)
SEX HORMONE BINDING: 66 nmol/L (ref 22–77)
Testosterone: 208 ng/dL — ABNORMAL LOW (ref 250–827)

## 2015-12-29 LAB — HIV ANTIBODY (ROUTINE TESTING W REFLEX): HIV: NONREACTIVE

## 2015-12-29 LAB — HEPATITIS C ANTIBODY: HCV Ab: NEGATIVE

## 2015-12-29 NOTE — Assessment & Plan Note (Signed)
He requests testosterone level checked and if low will need recheck since this is afternoon.

## 2015-12-29 NOTE — Assessment & Plan Note (Signed)
Checking lipid panel and adjust as needed.  

## 2015-12-29 NOTE — Assessment & Plan Note (Signed)
Does not need labs are recent in the last 2 months are normal.

## 2016-01-01 DIAGNOSIS — J3089 Other allergic rhinitis: Secondary | ICD-10-CM | POA: Diagnosis not present

## 2016-01-01 DIAGNOSIS — J301 Allergic rhinitis due to pollen: Secondary | ICD-10-CM | POA: Diagnosis not present

## 2016-01-04 DIAGNOSIS — J301 Allergic rhinitis due to pollen: Secondary | ICD-10-CM | POA: Diagnosis not present

## 2016-01-08 DIAGNOSIS — J301 Allergic rhinitis due to pollen: Secondary | ICD-10-CM | POA: Diagnosis not present

## 2016-01-08 DIAGNOSIS — J3089 Other allergic rhinitis: Secondary | ICD-10-CM | POA: Diagnosis not present

## 2016-01-09 ENCOUNTER — Encounter: Payer: Self-pay | Admitting: Internal Medicine

## 2016-01-10 ENCOUNTER — Encounter: Payer: Self-pay | Admitting: Endocrinology

## 2016-01-10 ENCOUNTER — Ambulatory Visit (INDEPENDENT_AMBULATORY_CARE_PROVIDER_SITE_OTHER): Payer: BLUE CROSS/BLUE SHIELD | Admitting: Endocrinology

## 2016-01-10 VITALS — BP 118/76 | HR 70 | Temp 98.2°F | Resp 14 | Ht 66.0 in | Wt 150.2 lb

## 2016-01-10 DIAGNOSIS — E038 Other specified hypothyroidism: Secondary | ICD-10-CM

## 2016-01-10 DIAGNOSIS — E063 Autoimmune thyroiditis: Secondary | ICD-10-CM

## 2016-01-10 DIAGNOSIS — E291 Testicular hypofunction: Secondary | ICD-10-CM | POA: Diagnosis not present

## 2016-01-10 LAB — T4, FREE: Free T4: 1.28 ng/dL (ref 0.60–1.60)

## 2016-01-10 LAB — TSH: TSH: 0.39 u[IU]/mL (ref 0.35–4.50)

## 2016-01-10 LAB — LUTEINIZING HORMONE: LH: 2.1 m[IU]/mL (ref 1.50–9.30)

## 2016-01-10 NOTE — Progress Notes (Signed)
Patient ID: Joseph Santana, male   DOB: Jan 03, 1956, 60 y.o.   MRN: [id number]            Reason for Appointment:  Evaluation of low testosterone    History of Present Illness:   HYPOGONADISM:  He was diagnosed to have a low testosterone level in 2012 or so when he was coming to his physician with complaints of increased fatigue.  Not clear what his baseline testosterone level was but he had a level of about 130 in 2013 around the time when he was having back surgery No record of detail evaluation with pituitary function available No previous prolactin and LH levels available  He has no history of injury or disease to the testicles or any head injury  He thinks he was tried on AndroGel initially but because of skin irritation he was changed to testosterone injections.  He may have been prescribed Axiron also but does not remember this He thinks he had significantly better with taking testosterone injections, apparently was taking about 100 mg every 2 weeks  He stopped taking the injections 6-8 months ago as they were not renewed and he had a change in his PCP He is again complaining of fatigue and is interested in starting treatment again  He has had the following testosterone levels  Lab Results  Component Value Date   TESTOSTERONE 208 (L) 12/28/2015   TESTOSTERONE 836.78 08/10/2013   TESTOSTERONE 382.43 03/09/2012   TESTOSTERONE 132.67 (L) 04/17/2011     Hypothyroidism was first diagnosed  about 30 years ago  At the time of diagnosis patient was having symptoms of  fatigue but he is not sure about all the other symptoms Most probably tested also because of strong family history of thyroid disease  The patient has been treated with  levothyroxine in various doses He had been on 137 g for some time and was doing subjectively fairly good until recently. More recently he had been having more complaints of feeling fatigued and having some cold intolerance which she thought was  from some weight loss His weight appears to be the same this year   He takes his thyroid supplement consistently in the morning and since his initial consultation has taken it separately from his Centrum vitamin which she was doing before TSH was 4.9 and his Synthroid was increased to 150 g          Patient's weight history is as follows:  Wt Readings from Last 3 Encounters:  01/10/16 150 lb 3.2 oz (68.1 kg)  12/28/15 152 lb (68.9 kg)  10/25/15 152 lb (68.9 kg)     Thyroid function results have been as follows:  Lab Results  Component Value Date   FREET4 0.90 10/18/2015   FREET4 1.01 03/18/2014   FREET4 1.01 04/17/2011   TSH 4.88 (H) 10/18/2015   TSH 1.22 03/18/2014   TSH 0.62 10/18/2013     Past Medical History:  Diagnosis Date  . ALLERGIC REACTION 07/22/2009  . ALLERGIC RHINITIS 10/08/2006  . B12 DEFICIENCY 05/14/2010  . Carpal tunnel syndrome 12/31/2007  . Degeneration of cervical intervertebral disc 12/31/2007  . HYPERLIPIDEMIA 10/08/2006  . HYPOTHYROIDISM 10/08/2006  . Irritable bowel syndrome 03/03/2008  . LUMBAR RADICULOPATHY, RIGHT 12/31/2006  . OSTEOARTHRITIS, LOWER LEG, LEFT 05/10/2009  . SINUSITIS - ACUTE-NOS 07/22/2009  . TESTICULAR HYPOFUNCTION 06/26/2009    Past Surgical History:  Procedure Laterality Date  . KNEE SURGERY    . LUMBAR LAMINECTOMY  2008   with revision  .  SPINAL FUSION  2011  . spinal fusion revision  2012    Family History  Problem Relation Age of Onset  . Hypothyroidism Father   . Asthma    . Diabetes    . Allergy (severe)    . Hypothyroidism Mother   . Hypothyroidism Sister   . Hypothyroidism Brother     Social History:  reports that he has never smoked. He has never used smokeless tobacco. He reports that he does not drink alcohol or use drugs.  Allergies:  Allergies  Allergen Reactions  . Iodine   . Levofloxacin       Medication List       Accurate as of 01/10/16 10:27 AM. Always use your most recent med list.           levothyroxine 150 MCG tablet Commonly known as:  SYNTHROID, LEVOTHROID Take 1 tablet (150 mcg total) by mouth daily before breakfast.   multivitamin capsule Take 1 capsule by mouth daily.       Review of Systems  Constitutional: Negative for reduced appetite.  HENT: Negative for hoarseness and trouble swallowing.   Respiratory: Negative for daytime sleepiness.   Cardiovascular: Negative for leg swelling.  Gastrointestinal: Negative for constipation.  Endocrine: Positive for fatigue.  Neurological: Negative for weakness.            Examination:    BP 118/76   Pulse 70   Temp 98.2 F (36.8 C)   Resp 14   Ht 5\' 6"  (1.676 m)   Wt 150 lb 3.2 oz (68.1 kg)   SpO2 96%   BMI 24.24 kg/m      Assessment:  HYPOGONADISM:   This is a historical diagnosis and no previous evaluation for the etiology available He did subjectively feel significantly better with testosterone supplementation, mostly treated with injectable drugs although not clear if he had an adequate trial of various transdermal treatments  Discussed that since treatment may depend on evaluation of the pituitary function will do this before discussing treatment  HYPOTHYROIDISM, long-standing  His levels will be reassessed today since he has taken his Synthroid separately from his Centrum vitamins as directed Difficult to assess subjectively since he is still having fatigue from hypogonadism Currently on 150 g   PLAN:   Labs to be done today and treatment determined subsequently Discussed option of using clomiphene for treatment for hypogonadism if he has normal to low LH levels, discussed how this works, dosage regimen  He is also preferring and non-injectable treatment   Total visit time for evaluation and management of above problems = 25 minutes  Joseph Santana 01/10/2016, 10:27 AM   ADDENDUM: LH is low, he will start clomiphene half tablet 3 times a week TSH is low normal and he needs to do a  half tablet on Sundays and then 150 g dose   Office Visit on 01/10/2016  Component Date Value Ref Range Status  . LH 01/10/2016 2.10  1.50 - 9.30 mIU/mL Final   Comment: Male Reference Range:20-70 yrs     1.5-9.3 mIU/mL>70 yrs       3.1-35.6 mIU/mLFemale Reference Range:Follicular Phase     1.9-12.5 mIU/mLMidcycle             8.7-76.3 mIU/mLLuteal Phase         0.5-16.9 mIU/mL  Post Menopausal      15 .9-54.0  mIU/mLPregnant             <1.5 mIU/mLContraceptives       0.7-5.6 mIU/mL   .  Prolactin 01/11/2016 8.3  4.0 - 15.2 ng/mL Final  . Free T4 01/10/2016 1.28  0.60 - 1.60 ng/dL Final  . TSH 09/81/1914 0.39  0.35 - 4.50 uIU/mL Final

## 2016-01-11 ENCOUNTER — Other Ambulatory Visit: Payer: Self-pay | Admitting: *Deleted

## 2016-01-11 ENCOUNTER — Telehealth: Payer: Self-pay | Admitting: Endocrinology

## 2016-01-11 LAB — PROLACTIN: Prolactin: 8.3 ng/mL (ref 4.0–15.2)

## 2016-01-11 MED ORDER — CLOMIPHENE CITRATE 50 MG PO TABS: ORAL_TABLET | ORAL | 2 refills | Status: DC

## 2016-01-11 NOTE — Telephone Encounter (Signed)
rx sent

## 2016-01-11 NOTE — Telephone Encounter (Signed)
Pt received message please call in the clomiphene to [facility] [address]

## 2016-01-15 DIAGNOSIS — J301 Allergic rhinitis due to pollen: Secondary | ICD-10-CM | POA: Diagnosis not present

## 2016-01-15 DIAGNOSIS — J3089 Other allergic rhinitis: Secondary | ICD-10-CM | POA: Diagnosis not present

## 2016-01-18 ENCOUNTER — Ambulatory Visit (INDEPENDENT_AMBULATORY_CARE_PROVIDER_SITE_OTHER): Payer: BLUE CROSS/BLUE SHIELD | Admitting: Internal Medicine

## 2016-01-18 ENCOUNTER — Encounter: Payer: Self-pay | Admitting: Internal Medicine

## 2016-01-18 DIAGNOSIS — Z0000 Encounter for general adult medical examination without abnormal findings: Secondary | ICD-10-CM

## 2016-01-18 NOTE — Progress Notes (Signed)
   Subjective:    Patient ID: Joseph Santana, male    DOB: 1955-09-10, 60 y.o.   MRN: [id number]  HPI The patient is a 60 YO man coming in for wellness. No new concerns.   [facility], [facility], social history reviewed and updated.   Review of Systems  Constitutional: Negative for activity change, appetite change, fatigue, fever and unexpected weight change.  HENT: Negative.   Eyes: Negative.   Respiratory: Negative.   Cardiovascular: Negative.   Gastrointestinal: Negative.   Musculoskeletal: Negative.   Skin: Negative.   Neurological: Negative.   Psychiatric/Behavioral: Negative.       Objective:   Physical Exam  Constitutional: He is oriented to person, place, and time. He appears well-developed and well-nourished.  HENT:  Head: Normocephalic and atraumatic.  Eyes: EOM are normal.  Neck: Normal range of motion.  Cardiovascular: Normal rate and regular rhythm.   Pulmonary/Chest: Effort normal and breath sounds normal.  Abdominal: Soft. He exhibits no distension. There is no tenderness. There is no rebound.  Musculoskeletal: He exhibits no edema.  Neurological: He is alert and oriented to person, place, and time.  Skin: Skin is warm and dry.  Psychiatric: He has a normal mood and affect.   Vitals:   01/18/16 1304  BP: 128/78  Pulse: (!) 51  Resp: 12  Temp: 98.4 F (36.9 C)  TempSrc: Oral  SpO2: 98%  Weight: 148 lb (67.1 kg)  Height: 5\' 10"  (1.778 m)      Assessment & Plan:

## 2016-01-18 NOTE — Progress Notes (Signed)
Pre visit review using our clinic review tool, if applicable. No additional management support is needed unless otherwise documented below in the visit note. 

## 2016-01-18 NOTE — Patient Instructions (Signed)
You are up to date on the health and doing well.   Keep up the good work with the exercise.   Health Maintenance, Male A healthy lifestyle and preventative care can promote health and wellness.  Maintain regular health, dental, and eye exams.  Eat a healthy diet. Foods like vegetables, fruits, whole grains, low-fat dairy products, and lean protein foods contain the nutrients you need and are low in calories. Decrease your intake of foods high in solid fats, added sugars, and salt. Get information about a proper diet from your health care provider, if necessary.  Regular physical exercise is one of the most important things you can do for your health. Most adults should get at least 150 minutes of moderate-intensity exercise (any activity that increases your heart rate and causes you to sweat) each week. In addition, most adults need muscle-strengthening exercises on 2 or more days a week.   Maintain a healthy weight. The body mass index (BMI) is a screening tool to identify possible weight problems. It provides an estimate of body fat based on height and weight. Your health care provider can find your BMI and can help you achieve or maintain a healthy weight. For males 20 years and older:  A BMI below 18.5 is considered underweight.  A BMI of 18.5 to 24.9 is normal.  A BMI of 25 to 29.9 is considered overweight.  A BMI of 30 and above is considered obese.  Maintain normal blood lipids and cholesterol by exercising and minimizing your intake of saturated fat. Eat a balanced diet with plenty of fruits and vegetables. Blood tests for lipids and cholesterol should begin at age 58 and be repeated every 5 years. If your lipid or cholesterol levels are high, you are over age 58, or you are at high risk for heart disease, you may need your cholesterol levels checked more frequently.Ongoing high lipid and cholesterol levels should be treated with medicines if diet and exercise are not working.  If  you smoke, find out from your health care provider how to quit. If you do not use tobacco, do not start.  Lung cancer screening is recommended for adults aged 55-80 years who are at high risk for developing lung cancer because of a history of smoking. A yearly low-dose CT scan of the lungs is recommended for people who have at least a 30-pack-year history of smoking and are current smokers or have quit within the past 15 years. A pack year of smoking is smoking an average of 1 pack of cigarettes a day for 1 year (for example, a 30-pack-year history of smoking could mean smoking 1 pack a day for 30 years or 2 packs a day for 15 years). Yearly screening should continue until the smoker has stopped smoking for at least 15 years. Yearly screening should be stopped for people who develop a health problem that would prevent them from having lung cancer treatment.  If you choose to drink alcohol, do not have more than 2 drinks per day. One drink is considered to be 12 oz (360 mL) of beer, 5 oz (150 mL) of wine, or 1.5 oz (45 mL) of liquor.  Avoid the use of street drugs. Do not share needles with anyone. Ask for help if you need support or instructions about stopping the use of drugs.  High blood pressure causes heart disease and increases the risk of stroke. High blood pressure is more likely to develop in:  People who have blood pressure in  the end of the normal range (100-139/85-89 mm Hg).  People who are overweight or obese.  People who are African American.  If you are 73-75 years of age, have your blood pressure checked every 3-5 years. If you are 89 years of age or older, have your blood pressure checked every year. You should have your blood pressure measured twice--once when you are at a hospital or clinic, and once when you are not at a hospital or clinic. Record the average of the two measurements. To check your blood pressure when you are not at a hospital or clinic, you can use:  An automated  blood pressure machine at a pharmacy.  A home blood pressure monitor.  If you are 40-18 years old, ask your health care provider if you should take aspirin to prevent heart disease.  Diabetes screening involves taking a blood sample to check your fasting blood sugar level. This should be done once every 3 years after age 28 if you are at a normal weight and without risk factors for diabetes. Testing should be considered at a younger age or be carried out more frequently if you are overweight and have at least 1 risk factor for diabetes.  Colorectal cancer can be detected and often prevented. Most routine colorectal cancer screening begins at the age of 27 and continues through age 51. However, your health care provider may recommend screening at an earlier age if you have risk factors for colon cancer. On a yearly basis, your health care provider may provide home test kits to check for hidden blood in the stool. A small camera at the end of a tube may be used to directly examine the colon (sigmoidoscopy or colonoscopy) to detect the earliest forms of colorectal cancer. Talk to your health care provider about this at age 95 when routine screening begins. A direct exam of the colon should be repeated every 5-10 years through age 61, unless early forms of precancerous polyps or small growths are found.  People who are at an increased risk for hepatitis B should be screened for this virus. You are considered at high risk for hepatitis B if:  You were born in a country where hepatitis B occurs often. Talk with your health care provider about which countries are considered high risk.  Your parents were born in a high-risk country and you have not received a shot to protect against hepatitis B (hepatitis B vaccine).  You have HIV or AIDS.  You use needles to inject street drugs.  You live with, or have sex with, someone who has hepatitis B.  You are a man who has sex with other men (MSM).  You get  hemodialysis treatment.  You take certain medicines for conditions like cancer, organ transplantation, and autoimmune conditions.  Hepatitis C blood testing is recommended for all people born from [birth year] through 1963 and any individual with known risk factors for hepatitis C.  Healthy men should no longer receive prostate-specific antigen (PSA) blood tests as part of routine cancer screening. Talk to your health care provider about prostate cancer screening.  Testicular cancer screening is not recommended for adolescents or adult males who have no symptoms. Screening includes self-exam, a health care provider exam, and other screening tests. Consult with your health care provider about any symptoms you have or any concerns you have about testicular cancer.  Practice safe sex. Use condoms and avoid high-risk sexual practices to reduce the spread of sexually transmitted infections (STIs).  You  should be screened for STIs, including gonorrhea and chlamydia if:  You are sexually active and are younger than 24 years.  You are older than 24 years, and your health care provider tells you that you are at risk for this type of infection.  Your sexual activity has changed since you were last screened, and you are at an increased risk for chlamydia or gonorrhea. Ask your health care provider if you are at risk.  If you are at risk of being infected with HIV, it is recommended that you take a prescription medicine daily to prevent HIV infection. This is called pre-exposure prophylaxis (PrEP). You are considered at risk if:  You are a man who has sex with other men (MSM).  You are a heterosexual man who is sexually active with multiple partners.  You take drugs by injection.  You are sexually active with a partner who has HIV.  Talk with your health care provider about whether you are at high risk of being infected with HIV. If you choose to begin PrEP, you should first be tested for HIV. You should  then be tested every 3 months for as long as you are taking PrEP.  Use sunscreen. Apply sunscreen liberally and repeatedly throughout the day. You should seek shade when your shadow is shorter than you. Protect yourself by wearing long sleeves, pants, a wide-brimmed hat, and sunglasses year round whenever you are outdoors.  Tell your health care provider of new moles or changes in moles, especially if there is a change in shape or color. Also, tell your health care provider if a mole is larger than the size of a pencil eraser.  A one-time screening for abdominal aortic aneurysm (AAA) and surgical repair of large AAAs by ultrasound is recommended for men aged 34-75 years who are current or former smokers.  Stay current with your vaccines (immunizations).   This information is not intended to replace advice given to you by your health care provider. Make sure you discuss any questions you have with your health care provider.   Document Released: 09/28/2007 Document Revised: 04/22/2014 Document Reviewed: 08/27/2010 Elsevier Interactive Patient Education [employer].

## 2016-01-19 NOTE — Assessment & Plan Note (Signed)
Labs reviewed with patient. Colonoscopy is up to date. Hep c and HIV screening negative and reviewed with patient. Counseled on sun safety and mole surveillance as well as dangers of distracted driving.

## 2016-01-29 DIAGNOSIS — J3089 Other allergic rhinitis: Secondary | ICD-10-CM | POA: Diagnosis not present

## 2016-01-29 DIAGNOSIS — J301 Allergic rhinitis due to pollen: Secondary | ICD-10-CM | POA: Diagnosis not present

## 2016-02-06 DIAGNOSIS — J3089 Other allergic rhinitis: Secondary | ICD-10-CM | POA: Diagnosis not present

## 2016-02-06 DIAGNOSIS — J301 Allergic rhinitis due to pollen: Secondary | ICD-10-CM | POA: Diagnosis not present

## 2016-02-09 DIAGNOSIS — J3089 Other allergic rhinitis: Secondary | ICD-10-CM | POA: Diagnosis not present

## 2016-02-12 DIAGNOSIS — J3089 Other allergic rhinitis: Secondary | ICD-10-CM | POA: Diagnosis not present

## 2016-02-12 DIAGNOSIS — J301 Allergic rhinitis due to pollen: Secondary | ICD-10-CM | POA: Diagnosis not present

## 2016-02-21 DIAGNOSIS — J3089 Other allergic rhinitis: Secondary | ICD-10-CM | POA: Diagnosis not present

## 2016-02-21 DIAGNOSIS — J301 Allergic rhinitis due to pollen: Secondary | ICD-10-CM | POA: Diagnosis not present

## 2016-02-26 DIAGNOSIS — J301 Allergic rhinitis due to pollen: Secondary | ICD-10-CM | POA: Diagnosis not present

## 2016-02-26 DIAGNOSIS — J3089 Other allergic rhinitis: Secondary | ICD-10-CM | POA: Diagnosis not present

## 2016-02-28 ENCOUNTER — Other Ambulatory Visit (INDEPENDENT_AMBULATORY_CARE_PROVIDER_SITE_OTHER): Payer: BLUE CROSS/BLUE SHIELD

## 2016-02-28 DIAGNOSIS — E063 Autoimmune thyroiditis: Secondary | ICD-10-CM

## 2016-02-28 DIAGNOSIS — E038 Other specified hypothyroidism: Secondary | ICD-10-CM

## 2016-02-28 DIAGNOSIS — E291 Testicular hypofunction: Secondary | ICD-10-CM

## 2016-02-28 LAB — LUTEINIZING HORMONE: LH: 2.94 m[IU]/mL (ref 1.50–9.30)

## 2016-02-28 LAB — TESTOSTERONE: Testosterone: 349.7 ng/dL (ref 300.00–890.00)

## 2016-02-28 LAB — TSH: TSH: 1.6 u[IU]/mL (ref 0.35–4.50)

## 2016-03-04 ENCOUNTER — Encounter: Payer: Self-pay | Admitting: Endocrinology

## 2016-03-04 ENCOUNTER — Ambulatory Visit (INDEPENDENT_AMBULATORY_CARE_PROVIDER_SITE_OTHER): Payer: BLUE CROSS/BLUE SHIELD | Admitting: Endocrinology

## 2016-03-04 VITALS — BP 120/80 | HR 55 | Wt 152.0 lb

## 2016-03-04 DIAGNOSIS — E291 Testicular hypofunction: Secondary | ICD-10-CM | POA: Diagnosis not present

## 2016-03-04 DIAGNOSIS — J301 Allergic rhinitis due to pollen: Secondary | ICD-10-CM | POA: Diagnosis not present

## 2016-03-04 DIAGNOSIS — J3089 Other allergic rhinitis: Secondary | ICD-10-CM | POA: Diagnosis not present

## 2016-03-04 NOTE — Progress Notes (Signed)
Patient ID: Joseph Santana, male   DOB: 27-Sep-1955, 60 y.o.   MRN: [id number]            Reason for Appointment:  Follow-up of low testosterone and thyroid    History of Present Illness:   HYPOGONADISM: Prior history: He was diagnosed to have a low testosterone level in 2012 or so when he was coming to his physician with complaints of increased fatigue.  Not clear what his baseline testosterone level was but he had a level of about 130 in 2013 around the time when he was having back surgery No record of detail evaluation with pituitary function available He thinks he was tried on AndroGel initially but because of skin irritation he was changed to testosterone injections.  He may have been prescribed Axiron also but does not remember this He thinks he had significantly better with taking testosterone injections, apparently was taking about 100 mg every 2 weeks He stopped taking the injections 6-8 months prior to his initial consultation  as they were not renewed and he had a change in his PCP  RECENT history: On his initial consultation he was complaining of fatigue and is interested in starting treatment again Baseline testosterone was 208 with LH of 2.1 and normal prolactin, free testosterone not done by the lab  He has been on a trial of clomiphene half tablet 3 times a week since 01/11/16 With this he feels a little more energy but not back to normal He has been exercising a lot and had been running frequent races and half marathons  He has had the following testosterone levels  Lab Results  Component Value Date   TESTOSTERONE 349.70 02/28/2016   TESTOSTERONE 208 (L) 12/28/2015   TESTOSTERONE 836.78 08/10/2013   TESTOSTERONE 382.43 03/09/2012     Hypothyroidism was first diagnosed  about 30 years ago  At the time of diagnosis patient was having symptoms of  fatigue but he is not sure about all the other symptoms Most probably tested also because of strong family history of  thyroid disease  The patient has been treated with  levothyroxine in various doses He had been on 137 g for some time and was doing subjectively fairly good until recently. More recently he had been having more complaints of feeling fatigued and having some cold intolerance which she thought was from some weight loss His weight appears to be the same this year   He takes his thyroid supplement consistently in the morning and since his initial consultation has taken it separately from his Centrum vitamin which he was doing before TSH was 4.9 and his Synthroid was increased to 150 g Also he was told to take 6-1/2 tablets a week on his last visit he is still taking 1 tablet daily         Patient's weight history is as follows:  Wt Readings from Last 3 Encounters:  03/04/16 152 lb (68.9 kg)  01/18/16 148 lb (67.1 kg)  01/10/16 150 lb 3.2 oz (68.1 kg)     Thyroid function results have been as follows:  Lab Results  Component Value Date   FREET4 1.28 01/10/2016   FREET4 0.90 10/18/2015   FREET4 1.01 03/18/2014   TSH 1.60 02/28/2016   TSH 0.39 01/10/2016   TSH 4.88 (H) 10/18/2015     Past Medical History:  Diagnosis Date  . ALLERGIC REACTION 07/22/2009  . ALLERGIC RHINITIS 10/08/2006  . B12 DEFICIENCY 05/14/2010  . Carpal tunnel syndrome 12/31/2007  .  Degeneration of cervical intervertebral disc 12/31/2007  . HYPERLIPIDEMIA 10/08/2006  . HYPOTHYROIDISM 10/08/2006  . Irritable bowel syndrome 03/03/2008  . LUMBAR RADICULOPATHY, RIGHT 12/31/2006  . OSTEOARTHRITIS, LOWER LEG, LEFT 05/10/2009  . SINUSITIS - ACUTE-NOS 07/22/2009  . TESTICULAR HYPOFUNCTION 06/26/2009    Past Surgical History:  Procedure Laterality Date  . KNEE SURGERY    . LUMBAR LAMINECTOMY  2008   with revision  . SPINAL FUSION  2011  . spinal fusion revision  2012    Family History  Problem Relation Age of Onset  . Hypothyroidism Father   . Asthma    . Diabetes    . Allergy (severe)    . Hypothyroidism  Mother   . Hypothyroidism Sister   . Hypothyroidism Brother     Social History:  reports that he has never smoked. He has never used smokeless tobacco. He reports that he does not drink alcohol or use drugs.  Allergies:  Allergies  Allergen Reactions  . Iodine   . Levofloxacin       Medication List       Accurate as of 03/04/16 11:24 AM. Always use your most recent med list.          clomiPHENE 50 MG tablet Commonly known as:  CLOMID Take 1/2 tablet 3 times a week   levothyroxine 150 MCG tablet Commonly known as:  SYNTHROID, LEVOTHROID Take 1 tablet (150 mcg total) by mouth daily before breakfast.   multivitamin capsule Take 1 capsule by mouth daily.       Review of Systems  Constitutional: Negative for reduced appetite.  HENT: Negative for hoarseness and trouble swallowing.   Respiratory: Negative for daytime sleepiness.   Cardiovascular: Negative for leg swelling.  Gastrointestinal: Negative for constipation.  Endocrine: Positive for fatigue.  Neurological: Negative for weakness.            Examination:    BP 120/80   Pulse (!) 55   Wt 152 lb (68.9 kg)   SpO2 99%   BMI 21.81 kg/m   Exam not indicated   Assessment:  HYPOGONADISM:   He appears to have hypogonadotropic hypogonadism baseline testosterone of 208 although free testosterone not assessed in the lab Also had normal and LH and prolactin levels With starting clomiphene half tablet 3 times a week he has some improvement in his energy level and testosterone level is 350  He did subjectively feel significantly better with testosterone supplementation, mostly treated with injectable drugs although not clear if he had an adequate trial of various transdermal treatments  Discussed that since treatment may depend on evaluation of the pituitary function will do this before discussing treatment  HYPOTHYROIDISM, long-standing, now has normal TSH on 150 g daily Has also been separating his  Centrum and thyroid medication as instructed before   PLAN:   Continue clomiphene unchanged May need to adjust this further but discussed that it may take some more time for the levels to improve [facility] as yet after 6 weeks of treatment No change in Synthroid   Keatyn Luck 03/04/2016, 11:24 AM

## 2016-03-11 DIAGNOSIS — J3089 Other allergic rhinitis: Secondary | ICD-10-CM | POA: Diagnosis not present

## 2016-03-11 DIAGNOSIS — J301 Allergic rhinitis due to pollen: Secondary | ICD-10-CM | POA: Diagnosis not present

## 2016-03-18 DIAGNOSIS — J3089 Other allergic rhinitis: Secondary | ICD-10-CM | POA: Diagnosis not present

## 2016-03-18 DIAGNOSIS — J301 Allergic rhinitis due to pollen: Secondary | ICD-10-CM | POA: Diagnosis not present

## 2016-03-25 DIAGNOSIS — J301 Allergic rhinitis due to pollen: Secondary | ICD-10-CM | POA: Diagnosis not present

## 2016-03-25 DIAGNOSIS — J3089 Other allergic rhinitis: Secondary | ICD-10-CM | POA: Diagnosis not present

## 2016-04-01 DIAGNOSIS — J3089 Other allergic rhinitis: Secondary | ICD-10-CM | POA: Diagnosis not present

## 2016-04-01 DIAGNOSIS — J301 Allergic rhinitis due to pollen: Secondary | ICD-10-CM | POA: Diagnosis not present

## 2016-04-02 ENCOUNTER — Telehealth: Payer: Self-pay | Admitting: Endocrinology

## 2016-04-02 ENCOUNTER — Other Ambulatory Visit: Payer: Self-pay | Admitting: Endocrinology

## 2016-04-02 ENCOUNTER — Other Ambulatory Visit: Payer: Self-pay

## 2016-04-02 MED ORDER — CLOMIPHENE CITRATE 50 MG PO TABS: ORAL_TABLET | ORAL | 2 refills | Status: DC

## 2016-04-02 NOTE — Telephone Encounter (Signed)
Ordered

## 2016-04-02 NOTE — Telephone Encounter (Signed)
Patient need a refill of clomiPHENE (CLOMID) 50 MG tablet  [id number] - [place], [place] - [address] [facility] [address] [phone] (Phone) [phone] (Fax)

## 2016-04-04 DIAGNOSIS — T63441A Toxic effect of venom of bees, accidental (unintentional), initial encounter: Secondary | ICD-10-CM | POA: Diagnosis not present

## 2016-04-04 DIAGNOSIS — Z91013 Allergy to seafood: Secondary | ICD-10-CM | POA: Diagnosis not present

## 2016-04-04 DIAGNOSIS — J301 Allergic rhinitis due to pollen: Secondary | ICD-10-CM | POA: Diagnosis not present

## 2016-04-04 DIAGNOSIS — J3089 Other allergic rhinitis: Secondary | ICD-10-CM | POA: Diagnosis not present

## 2016-04-16 DIAGNOSIS — J301 Allergic rhinitis due to pollen: Secondary | ICD-10-CM | POA: Diagnosis not present

## 2016-04-16 DIAGNOSIS — J3089 Other allergic rhinitis: Secondary | ICD-10-CM | POA: Diagnosis not present

## 2016-04-18 DIAGNOSIS — R05 Cough: Secondary | ICD-10-CM | POA: Diagnosis not present

## 2016-04-18 DIAGNOSIS — J019 Acute sinusitis, unspecified: Secondary | ICD-10-CM | POA: Diagnosis not present

## 2016-04-22 DIAGNOSIS — J301 Allergic rhinitis due to pollen: Secondary | ICD-10-CM | POA: Diagnosis not present

## 2016-04-22 DIAGNOSIS — J3089 Other allergic rhinitis: Secondary | ICD-10-CM | POA: Diagnosis not present

## 2016-05-07 DIAGNOSIS — J301 Allergic rhinitis due to pollen: Secondary | ICD-10-CM | POA: Diagnosis not present

## 2016-05-07 DIAGNOSIS — J3089 Other allergic rhinitis: Secondary | ICD-10-CM | POA: Diagnosis not present

## 2016-05-13 DIAGNOSIS — J301 Allergic rhinitis due to pollen: Secondary | ICD-10-CM | POA: Diagnosis not present

## 2016-05-13 DIAGNOSIS — J3089 Other allergic rhinitis: Secondary | ICD-10-CM | POA: Diagnosis not present

## 2016-05-20 DIAGNOSIS — J301 Allergic rhinitis due to pollen: Secondary | ICD-10-CM | POA: Diagnosis not present

## 2016-05-20 DIAGNOSIS — J3089 Other allergic rhinitis: Secondary | ICD-10-CM | POA: Diagnosis not present

## 2016-05-27 DIAGNOSIS — J3089 Other allergic rhinitis: Secondary | ICD-10-CM | POA: Diagnosis not present

## 2016-05-27 DIAGNOSIS — J301 Allergic rhinitis due to pollen: Secondary | ICD-10-CM | POA: Diagnosis not present

## 2016-05-27 DIAGNOSIS — J3081 Allergic rhinitis due to animal (cat) (dog) hair and dander: Secondary | ICD-10-CM | POA: Diagnosis not present

## 2016-05-29 ENCOUNTER — Other Ambulatory Visit: Payer: BLUE CROSS/BLUE SHIELD

## 2016-05-29 DIAGNOSIS — E291 Testicular hypofunction: Secondary | ICD-10-CM

## 2016-05-30 ENCOUNTER — Other Ambulatory Visit: Payer: BLUE CROSS/BLUE SHIELD

## 2016-06-01 LAB — TESTOSTERONE, TOTAL, LC/MS: TESTOSTERONE, TOTAL: 494.2 ng/dL (ref 264.0–916.0)

## 2016-06-03 DIAGNOSIS — J301 Allergic rhinitis due to pollen: Secondary | ICD-10-CM | POA: Diagnosis not present

## 2016-06-03 DIAGNOSIS — J3089 Other allergic rhinitis: Secondary | ICD-10-CM | POA: Diagnosis not present

## 2016-06-04 ENCOUNTER — Ambulatory Visit: Payer: BLUE CROSS/BLUE SHIELD | Admitting: Endocrinology

## 2016-06-07 ENCOUNTER — Encounter: Payer: Self-pay | Admitting: Endocrinology

## 2016-06-07 ENCOUNTER — Other Ambulatory Visit: Payer: Self-pay

## 2016-06-07 ENCOUNTER — Ambulatory Visit (INDEPENDENT_AMBULATORY_CARE_PROVIDER_SITE_OTHER): Payer: BLUE CROSS/BLUE SHIELD | Admitting: Endocrinology

## 2016-06-07 VITALS — BP 118/64 | HR 50 | Ht 70.0 in | Wt 154.0 lb

## 2016-06-07 DIAGNOSIS — E291 Testicular hypofunction: Secondary | ICD-10-CM | POA: Diagnosis not present

## 2016-06-07 DIAGNOSIS — E063 Autoimmune thyroiditis: Secondary | ICD-10-CM

## 2016-06-07 DIAGNOSIS — E038 Other specified hypothyroidism: Secondary | ICD-10-CM

## 2016-06-07 MED ORDER — CLOMIPHENE CITRATE 50 MG PO TABS: ORAL_TABLET | ORAL | 2 refills | Status: DC

## 2016-06-07 NOTE — Progress Notes (Signed)
Patient ID: Joseph NuttingKenton H Caffee, male   DOB: 1956-04-06, 61 y.o.   MRN: [id number]            Reason for Appointment:  Follow-up of low testosterone     History of Present Illness:   HYPOGONADISM: Prior history: He was diagnosed to have a low testosterone level in 2012 or so when he was coming to his physician with complaints of increased fatigue.  Not clear what his baseline testosterone level was but he had a level of about 130 in 2013 around the time when he was having back surgery No record of detail evaluation with pituitary function available He thinks he was tried on AndroGel initially but because of skin irritation he was changed to testosterone injections.  He may have been prescribed Axiron also but does not remember this He thinks he had significantly better with taking testosterone injections, apparently was taking about 100 mg every 2 weeks He stopped taking the injections 6-8 months prior to his initial consultation  as they were not renewed and he had a change in his PCP  RECENT history: On his initial consultation he was complaining of fatigue  Baseline testosterone was 208 with LH of 2.1 and normal prolactin, free testosterone not done by the lab  He has been on a trial of clomiphene half tablet 3 times a week since 01/11/16 With this he feels he has more energy but still tends to get tired.  He has been exercising and does not find any difficulty with his running capability, currently training for a marathon Also has had some issues with insomnia  Testosterone level appears to be further improved now  He has had the following testosterone levels  Lab Results  Component Value Date   TESTOSTERONE 494.2 05/29/2016   TESTOSTERONE 349.70 02/28/2016   TESTOSTERONE 208 (L) 12/28/2015   TESTOSTERONE 836.78 08/10/2013    Lab Results  Component Value Date   LH 2.94 02/28/2016   LH 2.10 01/10/2016    The following is a copy of the previous note about  hypothyroidism:  Hypothyroidism was first diagnosed  about 30 years ago  At the time of diagnosis patient was having symptoms of  fatigue but he is not sure about all the other symptoms Most probably tested also because of strong family history of thyroid disease  The patient has been treated with  levothyroxine in various doses He had been on 137 g for some time and was doing subjectively fairly good until recently. More recently he had been having more complaints of feeling fatigued and having some cold intolerance which she thought was from some weight loss His weight appears to be the same this year   He takes his thyroid supplement consistently in the morning and since his initial consultation has taken it separately from his Centrum vitamin which he was doing before TSH was 4.9 and his Synthroid was increased to 150 g Also he was told to take 6-1/2 tablets a week on his last visit he is still taking 1 tablet daily         Patient's weight history is as follows:  Wt Readings from Last 3 Encounters:  06/07/16 154 lb (69.9 kg)  03/04/16 152 lb (68.9 kg)  01/18/16 148 lb (67.1 kg)     Thyroid function results have been as follows:  Lab Results  Component Value Date   FREET4 1.28 01/10/2016   FREET4 0.90 10/18/2015   FREET4 1.01 03/18/2014   TSH 1.60 02/28/2016  TSH 0.39 01/10/2016   TSH 4.88 (H) 10/18/2015     Past Medical History:  Diagnosis Date  . ALLERGIC REACTION 07/22/2009  . ALLERGIC RHINITIS 10/08/2006  . B12 DEFICIENCY 05/14/2010  . Carpal tunnel syndrome 12/31/2007  . Degeneration of cervical intervertebral disc 12/31/2007  . HYPERLIPIDEMIA 10/08/2006  . HYPOTHYROIDISM 10/08/2006  . Irritable bowel syndrome 03/03/2008  . LUMBAR RADICULOPATHY, RIGHT 12/31/2006  . OSTEOARTHRITIS, LOWER LEG, LEFT 05/10/2009  . SINUSITIS - ACUTE-NOS 07/22/2009  . TESTICULAR HYPOFUNCTION 06/26/2009    Past Surgical History:  Procedure Laterality Date  . KNEE SURGERY    . LUMBAR  LAMINECTOMY  2008   with revision  . SPINAL FUSION  2011  . spinal fusion revision  2012    Family History  Problem Relation Age of Onset  . Hypothyroidism Father   . Asthma    . Diabetes    . Allergy (severe)    . Hypothyroidism Mother   . Hypothyroidism Sister   . Hypothyroidism Brother     Social History:  reports that he has never smoked. He has never used smokeless tobacco. He reports that he does not drink alcohol or use drugs.  Allergies:  Allergies  Allergen Reactions  . Iodine   . Levofloxacin     Allergies as of 06/07/2016      Reactions   Iodine    Levofloxacin       Medication List       Accurate as of 06/07/16 10:41 AM. Always use your most recent med list.          clomiPHENE 50 MG tablet Commonly known as:  CLOMID TAKE 1/2 TABLET 3 TIMES A WEEK   levothyroxine 150 MCG tablet Commonly known as:  SYNTHROID, LEVOTHROID Take 1 tablet (150 mcg total) by mouth daily before breakfast.   multivitamin capsule Take 1 capsule by mouth daily.       Review of Systems    He has nocturia 2-3 times        Examination:    BP 118/64   Pulse (!) 50   Ht 5\' 10"  (1.778 m)   Wt 154 lb (69.9 kg)   SpO2 99%   BMI 22.10 kg/m   Exam not indicated   Assessment:  HYPOGONADISM:   He appears to have hypogonadotropic hypogonadism baseline testosterone of 208 although free testosterone not assessed in the lab Also had normal and LH and prolactin levels With starting clomiphene half tablet 3 times a week he has some improvement in his energy level and testosterone level is 350  He did subjectively feel significantly better with testosterone supplementation, mostly treated with injectable drugs although not clear if he had an adequate trial of various transdermal treatments  Discussed that since treatment may depend on evaluation of the pituitary function will do this before discussing treatment  HYPOTHYROIDISM, long-standing, Needs follow-up on the next  visit   PLAN:   Continue clomiphene unchanged Advised him that since his level is nearly 500 that no adjustment is needed, likely that his mild continued fatigue is unrelated to low testosterone Indicated that higher levels of testosterone may be potentially causing long-term adverse effects  He will be seen in follow-up in 6 months and also will recheck CBC to evaluate RBC count  TSH to be checked on the next visit also  Chinmay Squier 06/07/2016, 10:41 AM

## 2016-06-10 DIAGNOSIS — J301 Allergic rhinitis due to pollen: Secondary | ICD-10-CM | POA: Diagnosis not present

## 2016-06-10 DIAGNOSIS — J3089 Other allergic rhinitis: Secondary | ICD-10-CM | POA: Diagnosis not present

## 2016-06-17 DIAGNOSIS — J3089 Other allergic rhinitis: Secondary | ICD-10-CM | POA: Diagnosis not present

## 2016-06-17 DIAGNOSIS — J3081 Allergic rhinitis due to animal (cat) (dog) hair and dander: Secondary | ICD-10-CM | POA: Diagnosis not present

## 2016-06-17 DIAGNOSIS — J301 Allergic rhinitis due to pollen: Secondary | ICD-10-CM | POA: Diagnosis not present

## 2016-06-19 DIAGNOSIS — J019 Acute sinusitis, unspecified: Secondary | ICD-10-CM | POA: Diagnosis not present

## 2016-06-19 DIAGNOSIS — R5383 Other fatigue: Secondary | ICD-10-CM | POA: Diagnosis not present

## 2016-06-21 DIAGNOSIS — R05 Cough: Secondary | ICD-10-CM | POA: Diagnosis not present

## 2016-06-21 DIAGNOSIS — J029 Acute pharyngitis, unspecified: Secondary | ICD-10-CM | POA: Diagnosis not present

## 2016-07-03 DIAGNOSIS — J3089 Other allergic rhinitis: Secondary | ICD-10-CM | POA: Diagnosis not present

## 2016-07-03 DIAGNOSIS — J301 Allergic rhinitis due to pollen: Secondary | ICD-10-CM | POA: Diagnosis not present

## 2016-07-05 DIAGNOSIS — J3089 Other allergic rhinitis: Secondary | ICD-10-CM | POA: Diagnosis not present

## 2016-07-05 DIAGNOSIS — J301 Allergic rhinitis due to pollen: Secondary | ICD-10-CM | POA: Diagnosis not present

## 2016-07-08 DIAGNOSIS — J3089 Other allergic rhinitis: Secondary | ICD-10-CM | POA: Diagnosis not present

## 2016-07-08 DIAGNOSIS — J301 Allergic rhinitis due to pollen: Secondary | ICD-10-CM | POA: Diagnosis not present

## 2016-07-15 ENCOUNTER — Encounter: Payer: Self-pay | Admitting: Internal Medicine

## 2016-07-15 DIAGNOSIS — J301 Allergic rhinitis due to pollen: Secondary | ICD-10-CM | POA: Diagnosis not present

## 2016-07-15 DIAGNOSIS — J3089 Other allergic rhinitis: Secondary | ICD-10-CM | POA: Diagnosis not present

## 2016-07-17 DIAGNOSIS — Z91013 Allergy to seafood: Secondary | ICD-10-CM | POA: Diagnosis not present

## 2016-07-17 DIAGNOSIS — J3089 Other allergic rhinitis: Secondary | ICD-10-CM | POA: Diagnosis not present

## 2016-07-17 DIAGNOSIS — J0100 Acute maxillary sinusitis, unspecified: Secondary | ICD-10-CM | POA: Diagnosis not present

## 2016-07-17 DIAGNOSIS — J301 Allergic rhinitis due to pollen: Secondary | ICD-10-CM | POA: Diagnosis not present

## 2016-07-24 DIAGNOSIS — J301 Allergic rhinitis due to pollen: Secondary | ICD-10-CM | POA: Diagnosis not present

## 2016-07-24 DIAGNOSIS — J3089 Other allergic rhinitis: Secondary | ICD-10-CM | POA: Diagnosis not present

## 2016-07-29 DIAGNOSIS — J3089 Other allergic rhinitis: Secondary | ICD-10-CM | POA: Diagnosis not present

## 2016-07-29 DIAGNOSIS — J301 Allergic rhinitis due to pollen: Secondary | ICD-10-CM | POA: Diagnosis not present

## 2016-08-05 DIAGNOSIS — J301 Allergic rhinitis due to pollen: Secondary | ICD-10-CM | POA: Diagnosis not present

## 2016-08-05 DIAGNOSIS — J3089 Other allergic rhinitis: Secondary | ICD-10-CM | POA: Diagnosis not present

## 2016-08-07 ENCOUNTER — Encounter: Payer: Self-pay | Admitting: Internal Medicine

## 2016-08-07 ENCOUNTER — Ambulatory Visit (INDEPENDENT_AMBULATORY_CARE_PROVIDER_SITE_OTHER): Payer: BLUE CROSS/BLUE SHIELD | Admitting: Internal Medicine

## 2016-08-07 DIAGNOSIS — J029 Acute pharyngitis, unspecified: Secondary | ICD-10-CM | POA: Diagnosis not present

## 2016-08-07 MED ORDER — DOXYCYCLINE HYCLATE 100 MG PO TABS: 100.0000 mg | ORAL_TABLET | Freq: Two times a day (BID) | ORAL | 0 refills | Status: DC

## 2016-08-07 MED ORDER — BENZONATATE 100 MG PO CAPS: ORAL_CAPSULE | ORAL | 0 refills | Status: DC

## 2016-08-07 NOTE — Progress Notes (Signed)
Pre visit review using our clinic review tool, if applicable. No additional management support is needed unless otherwise documented below in the visit note. 

## 2016-08-07 NOTE — Progress Notes (Signed)
   Subjective:    Patient ID: Joseph Santana, male    DOB: 1955/07/29, 61 y.o.   MRN: [id number]  HPI  Here with 2-3 days acute onset fever, severe throat pain, pressure, headache, general weakness and malaise, and non prod cough, but pt denies chest pain, wheezing, increased sob or doe, orthopnea, PND, increased LE swelling, palpitations, dizziness or syncope. Does have several wks ongoing nasal allergy symptoms with clearish congestion, itch and sneezing, without fever, pain, ST, cough, swelling or wheezing.  Did have zpack early mar 2018 for similar.   Past Medical History:  Diagnosis Date  . ALLERGIC REACTION 07/22/2009  . ALLERGIC RHINITIS 10/08/2006  . B12 DEFICIENCY 05/14/2010  . Carpal tunnel syndrome 12/31/2007  . Degeneration of cervical intervertebral disc 12/31/2007  . HYPERLIPIDEMIA 10/08/2006  . HYPOTHYROIDISM 10/08/2006  . Irritable bowel syndrome 03/03/2008  . LUMBAR RADICULOPATHY, RIGHT 12/31/2006  . OSTEOARTHRITIS, LOWER LEG, LEFT 05/10/2009  . SINUSITIS - ACUTE-NOS 07/22/2009  . TESTICULAR HYPOFUNCTION 06/26/2009   Past Surgical History:  Procedure Laterality Date  . KNEE SURGERY    . LUMBAR LAMINECTOMY  2008   with revision  . SPINAL FUSION  2011  . spinal fusion revision  2012    reports that he has never smoked. He has never used smokeless tobacco. He reports that he does not drink alcohol or use drugs. family history includes Hypothyroidism in his brother, father, mother, and sister. Allergies  Allergen Reactions  . Iodine   . Levofloxacin    Current Outpatient Prescriptions on File Prior to Visit  Medication Sig Dispense Refill  . clomiPHENE (CLOMID) 50 MG tablet TAKE 1/2 TABLET 3 TIMES A WEEK 7 tablet 2  . levothyroxine (SYNTHROID, LEVOTHROID) 150 MCG tablet Take 1 tablet (150 mcg total) by mouth daily before breakfast. 90 tablet 3  . Multiple Vitamin (MULTIVITAMIN) capsule Take 1 capsule by mouth daily.       No current facility-administered medications on file  prior to visit.    Review of Systems All otherwise neg per pt    Objective:   Physical Exam BP 104/70   Pulse 60   Ht  (1.778 m)   Wt 151 lb (68.5 kg)   SpO2 100%   BMI 21.67 kg/m  VS noted,  Constitutional: Pt appears in NAD HENT: Head: NCAT.  Right Ear: External ear normal.  Left Ear: External ear normal.  Eyes: . Pupils are equal, round, and reactive to light. Conjunctivae and EOM are normal Bilat tm's with mild erythema.  Max sinus areas mild tender.  Pharynx with mild erythema, no exudate Nose: without d/c or deformity Neck: Neck supple. Gross normal ROM Cardiovascular: Normal rate and regular rhythm.   Pulmonary/Chest: Effort normal and breath sounds without rales or wheezing.  Neurological: Pt is alert. At baseline orientation, motor grossly intact Skin: Skin is warm. No rashes, other new lesions, no LE edema Psychiatric: Pt behavior is normal without agitation  No other exam findings    Assessment & Plan:

## 2016-08-07 NOTE — Assessment & Plan Note (Signed)
Mild to mod, for antibx course,  to f/u any worsening symptoms or concerns 

## 2016-08-07 NOTE — Patient Instructions (Signed)
Please take all new medication as prescribed - the antibiotic, and cough pills if needed  Please continue all other medications as before, and refills have been done if requested.  Please have the pharmacy call with any other refills you may need.  Please keep your appointments with your specialists as you may have planned    

## 2016-08-12 DIAGNOSIS — J3089 Other allergic rhinitis: Secondary | ICD-10-CM | POA: Diagnosis not present

## 2016-08-12 DIAGNOSIS — J301 Allergic rhinitis due to pollen: Secondary | ICD-10-CM | POA: Diagnosis not present

## 2016-08-18 ENCOUNTER — Other Ambulatory Visit: Payer: Self-pay | Admitting: Endocrinology

## 2016-08-20 DIAGNOSIS — J3089 Other allergic rhinitis: Secondary | ICD-10-CM | POA: Diagnosis not present

## 2016-08-20 DIAGNOSIS — J301 Allergic rhinitis due to pollen: Secondary | ICD-10-CM | POA: Diagnosis not present

## 2016-08-26 DIAGNOSIS — J301 Allergic rhinitis due to pollen: Secondary | ICD-10-CM | POA: Diagnosis not present

## 2016-08-26 DIAGNOSIS — J3089 Other allergic rhinitis: Secondary | ICD-10-CM | POA: Diagnosis not present

## 2016-09-02 DIAGNOSIS — J301 Allergic rhinitis due to pollen: Secondary | ICD-10-CM | POA: Diagnosis not present

## 2016-09-02 DIAGNOSIS — J3089 Other allergic rhinitis: Secondary | ICD-10-CM | POA: Diagnosis not present

## 2016-09-10 DIAGNOSIS — J301 Allergic rhinitis due to pollen: Secondary | ICD-10-CM | POA: Diagnosis not present

## 2016-09-10 DIAGNOSIS — T63441A Toxic effect of venom of bees, accidental (unintentional), initial encounter: Secondary | ICD-10-CM | POA: Diagnosis not present

## 2016-09-10 DIAGNOSIS — J3089 Other allergic rhinitis: Secondary | ICD-10-CM | POA: Diagnosis not present

## 2016-09-10 DIAGNOSIS — Z91013 Allergy to seafood: Secondary | ICD-10-CM | POA: Diagnosis not present

## 2016-09-16 DIAGNOSIS — J3089 Other allergic rhinitis: Secondary | ICD-10-CM | POA: Diagnosis not present

## 2016-09-16 DIAGNOSIS — J301 Allergic rhinitis due to pollen: Secondary | ICD-10-CM | POA: Diagnosis not present

## 2016-09-17 DIAGNOSIS — J301 Allergic rhinitis due to pollen: Secondary | ICD-10-CM | POA: Diagnosis not present

## 2016-09-18 ENCOUNTER — Telehealth: Payer: Self-pay | Admitting: Internal Medicine

## 2016-09-18 DIAGNOSIS — J3089 Other allergic rhinitis: Secondary | ICD-10-CM | POA: Diagnosis not present

## 2016-09-23 ENCOUNTER — Other Ambulatory Visit: Payer: Self-pay | Admitting: Internal Medicine

## 2016-09-23 DIAGNOSIS — H5213 Myopia, bilateral: Secondary | ICD-10-CM | POA: Diagnosis not present

## 2016-09-23 DIAGNOSIS — J301 Allergic rhinitis due to pollen: Secondary | ICD-10-CM | POA: Diagnosis not present

## 2016-09-23 DIAGNOSIS — H04123 Dry eye syndrome of bilateral lacrimal glands: Secondary | ICD-10-CM | POA: Diagnosis not present

## 2016-09-23 DIAGNOSIS — J3089 Other allergic rhinitis: Secondary | ICD-10-CM | POA: Diagnosis not present

## 2016-10-08 DIAGNOSIS — J301 Allergic rhinitis due to pollen: Secondary | ICD-10-CM | POA: Diagnosis not present

## 2016-10-08 DIAGNOSIS — J3089 Other allergic rhinitis: Secondary | ICD-10-CM | POA: Diagnosis not present

## 2016-10-14 DIAGNOSIS — J3089 Other allergic rhinitis: Secondary | ICD-10-CM | POA: Diagnosis not present

## 2016-10-14 DIAGNOSIS — J301 Allergic rhinitis due to pollen: Secondary | ICD-10-CM | POA: Diagnosis not present

## 2016-10-21 DIAGNOSIS — J301 Allergic rhinitis due to pollen: Secondary | ICD-10-CM | POA: Diagnosis not present

## 2016-10-21 DIAGNOSIS — J3089 Other allergic rhinitis: Secondary | ICD-10-CM | POA: Diagnosis not present

## 2016-10-29 DIAGNOSIS — J3089 Other allergic rhinitis: Secondary | ICD-10-CM | POA: Diagnosis not present

## 2016-10-29 DIAGNOSIS — J3081 Allergic rhinitis due to animal (cat) (dog) hair and dander: Secondary | ICD-10-CM | POA: Diagnosis not present

## 2016-10-29 DIAGNOSIS — J301 Allergic rhinitis due to pollen: Secondary | ICD-10-CM | POA: Diagnosis not present

## 2016-11-04 DIAGNOSIS — J301 Allergic rhinitis due to pollen: Secondary | ICD-10-CM | POA: Diagnosis not present

## 2016-11-04 DIAGNOSIS — J3089 Other allergic rhinitis: Secondary | ICD-10-CM | POA: Diagnosis not present

## 2016-11-12 DIAGNOSIS — J3089 Other allergic rhinitis: Secondary | ICD-10-CM | POA: Diagnosis not present

## 2016-11-12 DIAGNOSIS — J3081 Allergic rhinitis due to animal (cat) (dog) hair and dander: Secondary | ICD-10-CM | POA: Diagnosis not present

## 2016-11-12 DIAGNOSIS — J301 Allergic rhinitis due to pollen: Secondary | ICD-10-CM | POA: Diagnosis not present

## 2016-11-18 DIAGNOSIS — J301 Allergic rhinitis due to pollen: Secondary | ICD-10-CM | POA: Diagnosis not present

## 2016-11-18 DIAGNOSIS — J3081 Allergic rhinitis due to animal (cat) (dog) hair and dander: Secondary | ICD-10-CM | POA: Diagnosis not present

## 2016-11-18 DIAGNOSIS — J3089 Other allergic rhinitis: Secondary | ICD-10-CM | POA: Diagnosis not present

## 2016-12-02 ENCOUNTER — Other Ambulatory Visit (INDEPENDENT_AMBULATORY_CARE_PROVIDER_SITE_OTHER): Payer: BLUE CROSS/BLUE SHIELD

## 2016-12-02 DIAGNOSIS — E063 Autoimmune thyroiditis: Secondary | ICD-10-CM

## 2016-12-02 DIAGNOSIS — E291 Testicular hypofunction: Secondary | ICD-10-CM | POA: Diagnosis not present

## 2016-12-02 LAB — T4, FREE: Free T4: 1.21 ng/dL (ref 0.60–1.60)

## 2016-12-02 LAB — TSH: TSH: 1.27 u[IU]/mL (ref 0.35–4.50)

## 2016-12-02 LAB — CBC
HCT: 41.6 % (ref 39.0–52.0)
Hemoglobin: 14 g/dL (ref 13.0–17.0)
MCHC: 33.6 g/dL (ref 30.0–36.0)
MCV: 89.7 fl (ref 78.0–100.0)
Platelets: 217 10*3/uL (ref 150.0–400.0)
RBC: 4.64 Mil/uL (ref 4.22–5.81)
RDW: 13.2 % (ref 11.5–15.5)
WBC: 5 10*3/uL (ref 4.0–10.5)

## 2016-12-02 LAB — TESTOSTERONE: TESTOSTERONE: 292.08 ng/dL — AB (ref 300.00–890.00)

## 2016-12-03 DIAGNOSIS — J3089 Other allergic rhinitis: Secondary | ICD-10-CM | POA: Diagnosis not present

## 2016-12-03 DIAGNOSIS — J301 Allergic rhinitis due to pollen: Secondary | ICD-10-CM | POA: Diagnosis not present

## 2016-12-04 NOTE — Progress Notes (Signed)
Patient ID: Joseph Santana, male   DOB: 23-Aug-1955, 61 y.o.   MRN: [id number]            Reason for Appointment:  Follow-up of low testosterone     History of Present Illness:   HYPOGONADISM: Prior history: He was diagnosed to have a low testosterone level in 2012 or so when he was coming to his physician with complaints of increased fatigue.  Not clear what his baseline testosterone level was but he had a level of about 130 in 2013 around the time when he was having back surgery No record of detail evaluation with pituitary function available He thinks he was tried on AndroGel initially but because of skin irritation he was changed to testosterone injections.  He may have been prescribed Axiron also but does not remember this He thinks he had significantly better with taking testosterone injections, apparently was taking about 100 mg every 2 weeks He stopped taking the injections 6-8 months prior to his initial consultation  as they were not renewed and he had a change in his PCP  RECENT history: On his initial consultation he was complaining of fatigue  Baseline testosterone was 208 with LH of 2.1 and normal prolactin, free testosterone not done by the lab  He has been on a trial of clomiphene half tablet 3 times a week since 01/11/16 With this he has had more energy after starting treatment With this his testosterone level has been in the normal range and in 2/18 with a different assay was 494  Over the last few weeks he thinks he has had a little more tiredness and not enough motivation He has been exercising and does not find any difficulty with his stamina however  Also has had some issues with insomnia  Testosterone level appears to be significantly lower than the last 2 levels He has been taking his clomiphene fairly regularly on Tuesdays/Thursdays and Saturdays in the mornings  He has had the following testosterone levels  Lab Results  Component Value Date   TESTOSTERONE  292.08 (L) 12/02/2016   TESTOSTERONE 494.2 05/29/2016   TESTOSTERONE 349.70 02/28/2016   TESTOSTERONE 208 (L) 12/28/2015    Lab Results  Component Value Date   LH 2.94 02/28/2016   LH 2.10 01/10/2016      Hypothyroidism was first diagnosed  about 30 years ago  At the time of diagnosis patient was having symptoms of  fatigue but he is not sure about all the other symptoms Most probably tested also because of strong family history of thyroid disease  The patient has been treated with  levothyroxine in various doses He had been on 137 g for some time and was doing subjectively fairly good until recently. More recently he had been having more complaints of feeling fatigued and having some cold intolerance which she thought was from some weight loss His weight appears to be the same this year   He takes his thyroid supplement consistently in the morning  Previously Synthroid was increased to 150 g, he takes this one tablet every day His fatigue appears to be from low testosterone recently         Patient's weight history is as follows:  Wt Readings from Last 3 Encounters:  12/05/16 152 lb (68.9 kg)  08/07/16 151 lb (68.5 kg)  06/07/16 154 lb (69.9 kg)     Thyroid function results have been as follows:  Lab Results  Component Value Date   TSH 1.27 12/02/2016  TSH 1.60 02/28/2016   TSH 0.39 01/10/2016   FREET4 1.21 12/02/2016   FREET4 1.28 01/10/2016   FREET4 0.90 10/18/2015      Past Medical History:  Diagnosis Date  . ALLERGIC REACTION 07/22/2009  . ALLERGIC RHINITIS 10/08/2006  . B12 DEFICIENCY 05/14/2010  . Carpal tunnel syndrome 12/31/2007  . Degeneration of cervical intervertebral disc 12/31/2007  . HYPERLIPIDEMIA 10/08/2006  . HYPOTHYROIDISM 10/08/2006  . Irritable bowel syndrome 03/03/2008  . LUMBAR RADICULOPATHY, RIGHT 12/31/2006  . OSTEOARTHRITIS, LOWER LEG, LEFT 05/10/2009  . SINUSITIS - ACUTE-NOS 07/22/2009  . TESTICULAR HYPOFUNCTION 06/26/2009    Past  Surgical History:  Procedure Laterality Date  . KNEE SURGERY    . LUMBAR LAMINECTOMY  2008   with revision  . SPINAL FUSION  2011  . spinal fusion revision  2012    Family History  Problem Relation Age of Onset  . Hypothyroidism Father   . Asthma Unknown   . Diabetes Unknown   . Allergy (severe) Unknown   . Hypothyroidism Mother   . Hypothyroidism Sister   . Hypothyroidism Brother     Social History:  reports that he has never smoked. He has never used smokeless tobacco. He reports that he does not drink alcohol or use drugs.  Allergies:  Allergies  Allergen Reactions  . Iodine   . Levofloxacin     Allergies as of 12/05/2016      Reactions   Iodine    Levofloxacin       Medication List       Accurate as of 12/05/16 11:01 AM. Always use your most recent med list.          ALLEGRA ALLERGY 180 MG tablet Generic drug:  fexofenadine Take 180 mg by mouth daily.   clomiPHENE 50 MG tablet Commonly known as:  CLOMID TAKE 1/2 TABLET 3 TIMES A WEEK   levothyroxine 150 MCG tablet Commonly known as:  SYNTHROID, LEVOTHROID TAKE 1 TABLET EVERY DAY BEFORE BREAKFAST   multivitamin capsule Take 1 capsule by mouth daily.       Review of Systems  He has had chronic insomnia        Examination:    BP 110/74   Pulse (!) 56   Ht 5\' 10"  (1.778 m)   Wt 152 lb (68.9 kg)   SpO2 98%   BMI 21.81 kg/m    Exam not indicated  Assessment:  HYPOGONADISM:   He has hypogonadotropic hypogonadism baseline testosterone of 208 and has been symptomatic With clomiphene half tablet 3 times a week he did have improved energy level and testosterone levels were progressively better also  Recently however he is complaining of feeling more fatigued and with decreased motivation and libido Testosterone level in the morning is now below 300, lower than before  HYPOTHYROIDISM, long-standing, taking 150 g of levothyroxine daily Recent thyroid levels are normal and he can continue  the same dose   PLAN:   He will now try to take clomiphene 5 days a week instead of 3 days a week with the same dose of 25 mg He can skip the doses on any 2 days of the week Will confirm that he is improved with follow-up in about 3 months Will also check his LH level If his testosterone levels continuing to state low may consider transdermal testosterone preparations  No change in thyroid supplement  Elsy Chiang 12/05/2016, 11:01 AM

## 2016-12-05 ENCOUNTER — Ambulatory Visit (INDEPENDENT_AMBULATORY_CARE_PROVIDER_SITE_OTHER): Payer: BLUE CROSS/BLUE SHIELD | Admitting: Endocrinology

## 2016-12-05 ENCOUNTER — Encounter: Payer: Self-pay | Admitting: Endocrinology

## 2016-12-05 VITALS — BP 110/74 | HR 56 | Ht 70.0 in | Wt 152.0 lb

## 2016-12-05 DIAGNOSIS — E063 Autoimmune thyroiditis: Secondary | ICD-10-CM | POA: Diagnosis not present

## 2016-12-05 DIAGNOSIS — E230 Hypopituitarism: Secondary | ICD-10-CM | POA: Diagnosis not present

## 2016-12-05 MED ORDER — CLOMIPHENE CITRATE 50 MG PO TABS: ORAL_TABLET | ORAL | 3 refills | Status: DC

## 2016-12-09 DIAGNOSIS — J3089 Other allergic rhinitis: Secondary | ICD-10-CM | POA: Diagnosis not present

## 2016-12-09 DIAGNOSIS — J301 Allergic rhinitis due to pollen: Secondary | ICD-10-CM | POA: Diagnosis not present

## 2016-12-17 DIAGNOSIS — J3089 Other allergic rhinitis: Secondary | ICD-10-CM | POA: Diagnosis not present

## 2016-12-17 DIAGNOSIS — J301 Allergic rhinitis due to pollen: Secondary | ICD-10-CM | POA: Diagnosis not present

## 2016-12-23 DIAGNOSIS — J301 Allergic rhinitis due to pollen: Secondary | ICD-10-CM | POA: Diagnosis not present

## 2016-12-23 DIAGNOSIS — J3089 Other allergic rhinitis: Secondary | ICD-10-CM | POA: Diagnosis not present

## 2016-12-30 DIAGNOSIS — J301 Allergic rhinitis due to pollen: Secondary | ICD-10-CM | POA: Diagnosis not present

## 2016-12-30 DIAGNOSIS — J3089 Other allergic rhinitis: Secondary | ICD-10-CM | POA: Diagnosis not present

## 2017-01-06 DIAGNOSIS — J301 Allergic rhinitis due to pollen: Secondary | ICD-10-CM | POA: Diagnosis not present

## 2017-01-06 DIAGNOSIS — J3089 Other allergic rhinitis: Secondary | ICD-10-CM | POA: Diagnosis not present

## 2017-01-08 DIAGNOSIS — J301 Allergic rhinitis due to pollen: Secondary | ICD-10-CM | POA: Diagnosis not present

## 2017-01-08 DIAGNOSIS — J3089 Other allergic rhinitis: Secondary | ICD-10-CM | POA: Diagnosis not present

## 2017-01-13 DIAGNOSIS — J301 Allergic rhinitis due to pollen: Secondary | ICD-10-CM | POA: Diagnosis not present

## 2017-01-13 DIAGNOSIS — J3089 Other allergic rhinitis: Secondary | ICD-10-CM | POA: Diagnosis not present

## 2017-01-14 ENCOUNTER — Ambulatory Visit: Payer: BLUE CROSS/BLUE SHIELD

## 2017-01-14 ENCOUNTER — Ambulatory Visit (INDEPENDENT_AMBULATORY_CARE_PROVIDER_SITE_OTHER): Payer: BLUE CROSS/BLUE SHIELD

## 2017-01-14 DIAGNOSIS — Z23 Encounter for immunization: Secondary | ICD-10-CM

## 2017-01-21 DIAGNOSIS — J3081 Allergic rhinitis due to animal (cat) (dog) hair and dander: Secondary | ICD-10-CM | POA: Diagnosis not present

## 2017-01-21 DIAGNOSIS — J3089 Other allergic rhinitis: Secondary | ICD-10-CM | POA: Diagnosis not present

## 2017-01-21 DIAGNOSIS — J301 Allergic rhinitis due to pollen: Secondary | ICD-10-CM | POA: Diagnosis not present

## 2017-01-27 DIAGNOSIS — J3089 Other allergic rhinitis: Secondary | ICD-10-CM | POA: Diagnosis not present

## 2017-01-27 DIAGNOSIS — J3081 Allergic rhinitis due to animal (cat) (dog) hair and dander: Secondary | ICD-10-CM | POA: Diagnosis not present

## 2017-01-27 DIAGNOSIS — J301 Allergic rhinitis due to pollen: Secondary | ICD-10-CM | POA: Diagnosis not present

## 2017-02-11 DIAGNOSIS — J301 Allergic rhinitis due to pollen: Secondary | ICD-10-CM | POA: Diagnosis not present

## 2017-02-11 DIAGNOSIS — J3089 Other allergic rhinitis: Secondary | ICD-10-CM | POA: Diagnosis not present

## 2017-02-17 DIAGNOSIS — J301 Allergic rhinitis due to pollen: Secondary | ICD-10-CM | POA: Diagnosis not present

## 2017-02-17 DIAGNOSIS — J3089 Other allergic rhinitis: Secondary | ICD-10-CM | POA: Diagnosis not present

## 2017-02-25 DIAGNOSIS — J3089 Other allergic rhinitis: Secondary | ICD-10-CM | POA: Diagnosis not present

## 2017-02-25 DIAGNOSIS — J301 Allergic rhinitis due to pollen: Secondary | ICD-10-CM | POA: Diagnosis not present

## 2017-02-28 ENCOUNTER — Other Ambulatory Visit: Payer: Self-pay

## 2017-02-28 MED ORDER — LEVOTHYROXINE SODIUM 150 MCG PO TABS: ORAL_TABLET | ORAL | 0 refills | Status: DC

## 2017-03-03 ENCOUNTER — Other Ambulatory Visit (INDEPENDENT_AMBULATORY_CARE_PROVIDER_SITE_OTHER): Payer: BLUE CROSS/BLUE SHIELD

## 2017-03-03 DIAGNOSIS — J3089 Other allergic rhinitis: Secondary | ICD-10-CM | POA: Diagnosis not present

## 2017-03-03 DIAGNOSIS — E230 Hypopituitarism: Secondary | ICD-10-CM

## 2017-03-03 DIAGNOSIS — E291 Testicular hypofunction: Secondary | ICD-10-CM

## 2017-03-03 DIAGNOSIS — J301 Allergic rhinitis due to pollen: Secondary | ICD-10-CM | POA: Diagnosis not present

## 2017-03-03 DIAGNOSIS — E039 Hypothyroidism, unspecified: Secondary | ICD-10-CM

## 2017-03-03 LAB — LUTEINIZING HORMONE: LH: 3.39 m[IU]/mL (ref 1.50–9.30)

## 2017-03-03 LAB — TESTOSTERONE: TESTOSTERONE: 302.7 ng/dL (ref 300.00–890.00)

## 2017-03-09 ENCOUNTER — Encounter: Payer: Self-pay | Admitting: Endocrinology

## 2017-03-09 NOTE — Progress Notes (Signed)
Patient ID: Joseph Santana, male   DOB: [birth year], 61 y.o.   MRN: [id number]            Reason for Appointment:  Follow-up of low testosterone     History of Present Illness:   HYPOGONADISM: Prior history: He was diagnosed to have a low testosterone level in 2012 or so when he was coming to his physician with complaints of increased fatigue.  Not clear what his baseline testosterone level was but he had a level of about 130 in 2013 around the time when he was having back surgery No record of detail evaluation with pituitary function available He thinks he was tried on AndroGel initially but because of skin irritation he was changed to testosterone injections.  He may have been prescribed Axiron also but does not remember this He thinks he had significantly better with taking testosterone injections, apparently was taking about 100 mg every 2 weeks He stopped taking the injections 6-8 months prior to his initial consultation  as they were not renewed and he had a change in his PCP  RECENT history: On his initial consultation he was complaining of fatigue  Baseline testosterone was 208 with LH of 2.1 and normal prolactin, free testosterone not done by the lab  He has been on a trial of clomiphene half tablet, initially 3 times a week since 01/11/16 With this he has had more energy after starting treatment With this his testosterone level came back to normal until 2/18  Over the last few months he has had more fatigue and sluggishness and somewhat decreased motivation although he has been able to exercise and do his running as usual Also has had some decreasing libido more recently With his testosterone level going down below 300 he was told to take him off in 5 days a week and he is skipping Tuesdays and Thursdays  Although with the initial increase he felt a little better he is again feeling somewhat more fatigued and sluggish and he thinks his symptoms are in proportion to his  testosterone level  He has had the following testosterone levels  Lab Results  Component Value Date   TESTOSTERONE 302.70 03/03/2017   TESTOSTERONE 292.08 (L) 12/02/2016   TESTOSTERONE 494.2 05/29/2016   TESTOSTERONE 349.70 02/28/2016    Lab Results  Component Value Date   LH 3.39 03/03/2017   LH 2.94 02/28/2016   LH 2.10 01/10/2016      Hypothyroidism was first diagnosed  about 30 years ago  At the time of diagnosis patient was having symptoms of  fatigue but he is not sure about all the other symptoms Most probably tested also because of strong family history of thyroid disease  The patient has been treated with  levothyroxine in various doses He had been on 137 g for some time and was doing subjectively fairly good until recently. More recently he had been having more complaints of feeling fatigued and having some cold intolerance which she thought was from some weight loss His weight appears to be the same this year   He takes his thyroid supplement consistently in the morning  Previously Synthroid was increased to 150 g, he takes this one tablet every day His fatigue appears to be from low testosterone recently         Patient's weight history is as follows:  Wt Readings from Last 3 Encounters:  03/10/17 153 lb 6.4 oz (69.6 kg)  12/05/16 152 lb (68.9 kg)  08/07/16 151 lb (  68.5 kg)     Thyroid function results have been as follows:  Lab Results  Component Value Date   TSH 1.27 12/02/2016   TSH 1.60 02/28/2016   TSH 0.39 01/10/2016   FREET4 1.21 12/02/2016   FREET4 1.28 01/10/2016   FREET4 0.90 10/18/2015      Past Medical History:  Diagnosis Date  . ALLERGIC REACTION 07/22/2009  . ALLERGIC RHINITIS 10/08/2006  . B12 DEFICIENCY 05/14/2010  . Carpal tunnel syndrome 12/31/2007  . Degeneration of cervical intervertebral disc 12/31/2007  . HYPERLIPIDEMIA 10/08/2006  . HYPOTHYROIDISM 10/08/2006  . Irritable bowel syndrome 03/03/2008  . LUMBAR RADICULOPATHY,  RIGHT 12/31/2006  . OSTEOARTHRITIS, LOWER LEG, LEFT 05/10/2009  . SINUSITIS - ACUTE-NOS 07/22/2009  . TESTICULAR HYPOFUNCTION 06/26/2009    Past Surgical History:  Procedure Laterality Date  . KNEE SURGERY    . LUMBAR LAMINECTOMY  2008   with revision  . SPINAL FUSION  2011  . spinal fusion revision  2012    Family History  Problem Relation Age of Onset  . Hypothyroidism Father   . Asthma Unknown   . Diabetes Unknown   . Allergy (severe) Unknown   . Hypothyroidism Mother   . Hypothyroidism Sister   . Hypothyroidism Brother     Social History:  reports that  has never smoked. he has never used smokeless tobacco. He reports that he does not drink alcohol or use drugs.  Allergies:  Allergies  Allergen Reactions  . Iodine   . Levofloxacin     Allergies as of 03/10/2017      Reactions   Iodine    Levofloxacin       Medication List        Accurate as of 03/10/17  8:44 AM. Always use your most recent med list.          ALLEGRA ALLERGY 180 MG tablet Generic drug:  fexofenadine Take 180 mg by mouth daily.   clomiPHENE 50 MG tablet Commonly known as:  CLOMID Half tablet 5 days a week   levothyroxine 150 MCG tablet Commonly known as:  SYNTHROID, LEVOTHROID TAKE 1 TABLET EVERY DAY BEFORE BREAKFAST **PT NEEDS AN APPOINTMENT FOR ADDITIONAL REFILLS**   multivitamin capsule Take 1 capsule by mouth daily.       Review of Systems  He has had chronic insomnia        Examination:    BP 122/68   Pulse 69   Ht 5\' 10"  (1.778 m)   Wt 153 lb 6.4 oz (69.6 kg)   SpO2 98%   BMI 22.01 kg/m    Exam not indicated  Assessment:  HYPOGONADISM:   He has hypogonadotropic hypogonadism baseline testosterone of 208 and has been symptomatic With clomiphene half tablet 5 days a week he has not had any further improvement in his testosterone level and LH is also about the same Most likely is not responding to this now with symptoms and also relatively low testosterone  level Although the dose can be potentially increased he may not benefit long-term from this   HYPOTHYROIDISM, long-standing, taking 150 g of levothyroxine daily He will have his labs checked on next visit again   PLAN:   He will now try to take AndroGel one pump on each arm instead of clomiphene 5 days a week  He has used Axiron before and also discussed how this is applied with precautions and application technique Follow-up in 2-3 months  Vidal Lampkins 03/10/2017, 8:44 AM

## 2017-03-10 ENCOUNTER — Ambulatory Visit (INDEPENDENT_AMBULATORY_CARE_PROVIDER_SITE_OTHER): Payer: BLUE CROSS/BLUE SHIELD | Admitting: Endocrinology

## 2017-03-10 ENCOUNTER — Encounter: Payer: Self-pay | Admitting: Endocrinology

## 2017-03-10 VITALS — BP 122/68 | HR 69 | Ht 70.0 in | Wt 153.4 lb

## 2017-03-10 DIAGNOSIS — J3089 Other allergic rhinitis: Secondary | ICD-10-CM | POA: Diagnosis not present

## 2017-03-10 DIAGNOSIS — T63441A Toxic effect of venom of bees, accidental (unintentional), initial encounter: Secondary | ICD-10-CM | POA: Diagnosis not present

## 2017-03-10 DIAGNOSIS — E230 Hypopituitarism: Secondary | ICD-10-CM

## 2017-03-10 DIAGNOSIS — Z91013 Allergy to seafood: Secondary | ICD-10-CM | POA: Diagnosis not present

## 2017-03-10 DIAGNOSIS — J301 Allergic rhinitis due to pollen: Secondary | ICD-10-CM | POA: Diagnosis not present

## 2017-03-10 MED ORDER — TESTOSTERONE 50 MG/5GM (1%) TD GEL: 5.0000 g | Freq: Every day | TRANSDERMAL | 3 refills | Status: DC

## 2017-03-11 ENCOUNTER — Telehealth: Payer: Self-pay | Admitting: Endocrinology

## 2017-03-11 NOTE — Telephone Encounter (Signed)
Patient is calling concerning his medication ,testosterone (ANDROGEL) 50 MG/5GM (1%) GEL There is some confusion, with the dosages. Please advise  [id number] - [place], [place] - [address] [facility] [address] [phone] (Phone) [phone] (Fax)

## 2017-03-11 NOTE — Telephone Encounter (Signed)
Called patient and he stated the pharmacy stated that his insurance covers something different.  Called the pharmacy and the pharmacist lady told me his insurance will cover the Androgel brand name 1.62 pump only.  Please advise dosage so we can resend his prescription.

## 2017-03-11 NOTE — Telephone Encounter (Signed)
Ok to change, one pump on each arm. Can get copay card online

## 2017-03-12 ENCOUNTER — Other Ambulatory Visit: Payer: Self-pay

## 2017-03-12 MED ORDER — TESTOSTERONE 20.25 MG/ACT (1.62%) TD GEL: TRANSDERMAL | 3 refills | Status: DC

## 2017-03-12 NOTE — Telephone Encounter (Signed)
[facility] patient need a PA for the Testosterone (ANDROGEL PUMP) 20.25 MG/ACT (1.62%) GEL [[id number]][[id number]]

## 2017-03-12 NOTE — Telephone Encounter (Signed)
Called the [facility] and spoke to WhitefishNatalie and she stated that everything they put in the system comes back needing a PA.  Misty StanleyLisa can you start this PA for his Androgel Pump 1.62?  Thank you!

## 2017-03-12 NOTE — Telephone Encounter (Signed)
Called patient and let him know that I have sent over his new prescription for Androgel to the [facility] for him and let him know about the Copay card online also.

## 2017-03-13 NOTE — Telephone Encounter (Signed)
Initiated PA for this patient and it was approved from 03/13/17 to 03/13/20- I spoke with Kirt BoysYundra from [facility] and the patient has been notified

## 2017-03-14 DIAGNOSIS — J3089 Other allergic rhinitis: Secondary | ICD-10-CM | POA: Diagnosis not present

## 2017-03-14 DIAGNOSIS — J301 Allergic rhinitis due to pollen: Secondary | ICD-10-CM | POA: Diagnosis not present

## 2017-03-16 ENCOUNTER — Encounter: Payer: Self-pay | Admitting: Endocrinology

## 2017-03-17 ENCOUNTER — Other Ambulatory Visit: Payer: Self-pay | Admitting: Endocrinology

## 2017-03-17 DIAGNOSIS — J3089 Other allergic rhinitis: Secondary | ICD-10-CM | POA: Diagnosis not present

## 2017-03-17 DIAGNOSIS — J301 Allergic rhinitis due to pollen: Secondary | ICD-10-CM | POA: Diagnosis not present

## 2017-03-17 MED ORDER — CLOMIPHENE CITRATE 50 MG PO TABS: ORAL_TABLET | ORAL | 3 refills | Status: DC

## 2017-03-30 ENCOUNTER — Other Ambulatory Visit: Payer: Self-pay | Admitting: Internal Medicine

## 2017-03-31 DIAGNOSIS — J301 Allergic rhinitis due to pollen: Secondary | ICD-10-CM | POA: Diagnosis not present

## 2017-03-31 DIAGNOSIS — J3089 Other allergic rhinitis: Secondary | ICD-10-CM | POA: Diagnosis not present

## 2017-04-10 DIAGNOSIS — J301 Allergic rhinitis due to pollen: Secondary | ICD-10-CM | POA: Diagnosis not present

## 2017-04-10 DIAGNOSIS — J3089 Other allergic rhinitis: Secondary | ICD-10-CM | POA: Diagnosis not present

## 2017-04-16 DIAGNOSIS — J3089 Other allergic rhinitis: Secondary | ICD-10-CM | POA: Diagnosis not present

## 2017-04-16 DIAGNOSIS — J301 Allergic rhinitis due to pollen: Secondary | ICD-10-CM | POA: Diagnosis not present

## 2017-04-22 DIAGNOSIS — J3089 Other allergic rhinitis: Secondary | ICD-10-CM | POA: Diagnosis not present

## 2017-04-22 DIAGNOSIS — J301 Allergic rhinitis due to pollen: Secondary | ICD-10-CM | POA: Diagnosis not present

## 2017-04-30 DIAGNOSIS — J301 Allergic rhinitis due to pollen: Secondary | ICD-10-CM | POA: Diagnosis not present

## 2017-04-30 DIAGNOSIS — J3089 Other allergic rhinitis: Secondary | ICD-10-CM | POA: Diagnosis not present

## 2017-05-06 ENCOUNTER — Other Ambulatory Visit (INDEPENDENT_AMBULATORY_CARE_PROVIDER_SITE_OTHER): Payer: BLUE CROSS/BLUE SHIELD

## 2017-05-06 DIAGNOSIS — E230 Hypopituitarism: Secondary | ICD-10-CM | POA: Diagnosis not present

## 2017-05-06 LAB — TESTOSTERONE: TESTOSTERONE: 272.84 ng/dL — AB (ref 300.00–890.00)

## 2017-05-07 ENCOUNTER — Ambulatory Visit (INDEPENDENT_AMBULATORY_CARE_PROVIDER_SITE_OTHER): Payer: BLUE CROSS/BLUE SHIELD | Admitting: Internal Medicine

## 2017-05-07 ENCOUNTER — Encounter: Payer: Self-pay | Admitting: Internal Medicine

## 2017-05-07 VITALS — BP 106/62 | HR 67 | Temp 98.8°F | Ht 70.0 in | Wt 155.8 lb

## 2017-05-07 DIAGNOSIS — B9789 Other viral agents as the cause of diseases classified elsewhere: Secondary | ICD-10-CM

## 2017-05-07 DIAGNOSIS — J069 Acute upper respiratory infection, unspecified: Secondary | ICD-10-CM | POA: Diagnosis not present

## 2017-05-07 DIAGNOSIS — J0101 Acute recurrent maxillary sinusitis: Secondary | ICD-10-CM | POA: Diagnosis not present

## 2017-05-07 DIAGNOSIS — R6889 Other general symptoms and signs: Secondary | ICD-10-CM | POA: Diagnosis not present

## 2017-05-07 LAB — POC INFLUENZA A&B (BINAX/QUICKVUE)
INFLUENZA A, POC: NEGATIVE
Influenza B, POC: NEGATIVE

## 2017-05-07 MED ORDER — AMOXICILLIN-POT CLAVULANATE 875-125 MG PO TABS
1.0000 | ORAL_TABLET | Freq: Two times a day (BID) | ORAL | 0 refills | Status: DC
Start: 2017-05-07 — End: 2017-06-03

## 2017-05-07 MED ORDER — HYDROCODONE-HOMATROPINE 5-1.5 MG/5ML PO SYRP: 5.0000 mL | ORAL_SOLUTION | Freq: Three times a day (TID) | ORAL | 0 refills | Status: DC | PRN

## 2017-05-07 NOTE — Progress Notes (Signed)
Subjective:  Patient ID: Joseph Santana, male    DOB: 1956/04/06  Age: 62 y.o. MRN: [id number]  CC: Cough   HPI Joseph Santana presents for a 5-day history of nonproductive cough, sore throat, postnasal drip, fever to 101, thick yellow nasal phlegm, facial pain, fatigue, and myalgias.  Outpatient Medications Prior to Visit  Medication Sig Dispense Refill  . clomiPHENE (CLOMID) 50 MG tablet 1 tablet 5 days a week 24 tablet 3  . fexofenadine (ALLEGRA ALLERGY) 180 MG tablet Take 180 mg by mouth daily.    Marland Kitchen. levothyroxine (SYNTHROID, LEVOTHROID) 150 MCG tablet TAKE 1 TABLET EVERY DAY BEFORE BREAKFAST **PT NEEDS AN APPOINTMENT FOR ADDITIONAL REFILLS** 30 tablet 0  . Multiple Vitamin (MULTIVITAMIN) capsule Take 1 capsule by mouth daily.       No facility-administered medications prior to visit.     ROS Review of Systems  Constitutional: Positive for chills, fatigue and fever.  HENT: Positive for rhinorrhea, sinus pressure, sinus pain and sore throat. Negative for facial swelling and trouble swallowing.   Respiratory: Positive for cough. Negative for chest tightness, shortness of breath and wheezing.   Cardiovascular: Negative for chest pain, palpitations and leg swelling.  Gastrointestinal: Negative for abdominal pain, constipation, diarrhea, nausea and vomiting.  Endocrine: Negative.   Genitourinary: Negative.  Negative for difficulty urinating.  Musculoskeletal: Positive for myalgias.  Skin: Negative.  Negative for rash.  Allergic/Immunologic: Negative for immunocompromised state.  Neurological: Negative.   Hematological: Negative for adenopathy. Does not bruise/bleed easily.  Psychiatric/Behavioral: Negative.     Objective:  BP 106/62 (BP Location: Left Arm, Patient Position: Sitting, Cuff Size: Normal)   Pulse 67   Temp 98.8 F (37.1 C) (Oral)   Ht 5\' 10"  (1.778 m)   Wt 155 lb 12 oz (70.6 kg)   SpO2 98%   BMI 22.35 kg/m   BP Readings from Last 3 Encounters:  05/07/17  106/62  03/10/17 122/68  12/05/16 110/74    Wt Readings from Last 3 Encounters:  05/07/17 155 lb 12 oz (70.6 kg)  03/10/17 153 lb 6.4 oz (69.6 kg)  12/05/16 152 lb (68.9 kg)    Physical Exam  Constitutional: He is oriented to person, place, and time.  Non-toxic appearance. He does not have a sickly appearance. He does not appear ill. No distress.  HENT:  Nose: Rhinorrhea present. Right sinus exhibits maxillary sinus tenderness. Right sinus exhibits no frontal sinus tenderness. Left sinus exhibits maxillary sinus tenderness. Left sinus exhibits no frontal sinus tenderness.  Mouth/Throat: Mucous membranes are normal. Mucous membranes are not cyanotic. No trismus in the jaw. Posterior oropharyngeal erythema present. No oropharyngeal exudate, posterior oropharyngeal edema or tonsillar abscesses.  Eyes: Conjunctivae are normal. Left eye exhibits no discharge. No scleral icterus.  Neck: Normal range of motion. Neck supple. No JVD present. No thyromegaly present.  Cardiovascular: Normal rate, regular rhythm and normal heart sounds.  No murmur heard. Pulmonary/Chest: Effort normal and breath sounds normal. No respiratory distress. He has no wheezes. He has no rales.  Abdominal: Bowel sounds are normal. He exhibits no distension and no mass. There is no tenderness.  Musculoskeletal: Normal range of motion. He exhibits no edema, tenderness or deformity.  Lymphadenopathy:    He has no cervical adenopathy.  Neurological: He is alert and oriented to person, place, and time.  Skin: Skin is warm and dry. No rash noted. He is not diaphoretic. No erythema. No pallor.  Vitals reviewed.   Lab Results  Component Value Date  WBC 5.0 12/02/2016   HGB 14.0 12/02/2016   HCT 41.6 12/02/2016   PLT 217.0 12/02/2016   GLUCOSE 71 12/28/2015   CHOL 174 12/28/2015   TRIG 45.0 12/28/2015   HDL 71.00 12/28/2015   LDLDIRECT 146.2 11/19/2012   LDLCALC 94 12/28/2015   ALT 35 12/28/2015   AST 28 12/28/2015    NA 140 12/28/2015   K 4.4 12/28/2015   CL 101 12/28/2015   CREATININE 0.87 12/28/2015   BUN 17 12/28/2015   CO2 34 (H) 12/28/2015   TSH 1.27 12/02/2016   PSA 0.78 03/04/2006    Ct Head Wo Contrast  Result Date: 03/04/2014 CLINICAL DATA:  Trauma on 02/22/2014. Slipped and hit head. Continued headaches with light sensitivity and fatigue. Suspected concussion. Initial encounter. EXAM: CT HEAD WITHOUT CONTRAST TECHNIQUE: Contiguous axial images were obtained from the base of the skull through the vertex without contrast. COMPARISON:  None FINDINGS: No evidence for acute infarction, hemorrhage, mass lesion, hydrocephalus, or extra-axial fluid. There is no cerebral atrophy or white matter disease. Mild cerebellar atrophy is suspected. Calvarium is intact. No significant scalp hematoma or foreign body. Negative orbits, sinuses, and mastoids. IMPRESSION: Mild cerebellar atrophy. Otherwise negative exam. No acute intracranial findings. Electronically Signed   By: Davonna Belling M.D.   On: 03/04/2014 12:32    Assessment & Plan:   Joseph Santana was seen today for cough.  Diagnoses and all orders for this visit:  Viral URI with cough -     HYDROcodone-homatropine (HYCODAN) 5-1.5 MG/5ML syrup; Take 5 mLs by mouth every 8 (eight) hours as needed for cough.  Flu-like symptoms- screening for influenza AMB is negative. -     POC Influenza A&B (Binax test)  Acute recurrent maxillary sinusitis -     amoxicillin-clavulanate (AUGMENTIN) 875-125 MG tablet; Take 1 tablet by mouth 2 (two) times daily.   I am having Joseph Santana start on HYDROcodone-homatropine and amoxicillin-clavulanate. I am also having him maintain his multivitamin, fexofenadine, clomiPHENE, and levothyroxine.  Meds ordered this encounter  Medications  . HYDROcodone-homatropine (HYCODAN) 5-1.5 MG/5ML syrup    Sig: Take 5 mLs by mouth every 8 (eight) hours as needed for cough.    Dispense:  120 mL    Refill:  0  .  amoxicillin-clavulanate (AUGMENTIN) 875-125 MG tablet    Sig: Take 1 tablet by mouth 2 (two) times daily.    Dispense:  20 tablet    Refill:  0     Follow-up: Return if symptoms worsen or fail to improve.  Sanda Linger, MD

## 2017-05-07 NOTE — Patient Instructions (Signed)

## 2017-05-09 ENCOUNTER — Encounter: Payer: Self-pay | Admitting: Endocrinology

## 2017-05-09 ENCOUNTER — Ambulatory Visit (INDEPENDENT_AMBULATORY_CARE_PROVIDER_SITE_OTHER): Payer: BLUE CROSS/BLUE SHIELD | Admitting: Endocrinology

## 2017-05-09 VITALS — BP 114/60 | HR 64 | Ht 70.0 in | Wt 156.0 lb

## 2017-05-09 DIAGNOSIS — E230 Hypopituitarism: Secondary | ICD-10-CM | POA: Diagnosis not present

## 2017-05-09 DIAGNOSIS — E039 Hypothyroidism, unspecified: Secondary | ICD-10-CM

## 2017-05-09 MED ORDER — TESTOSTERONE 10 MG/ACT (2%) TD GEL: TRANSDERMAL | 2 refills | Status: DC

## 2017-05-09 NOTE — Progress Notes (Addendum)
Patient ID: Joseph Santana, male   DOB: May 23, 1955, 62 y.o.   MRN: [id number]            Reason for Appointment:  Follow-up of low testosterone     History of Present Illness:   HYPOGONADISM: Prior history: He was diagnosed to have a low testosterone level in 2012 or so when he was coming to his physician with complaints of increased fatigue.  Not clear what his baseline testosterone level was but he had a level of about 130 in 2013 around the time when he was having back surgery No record of detail evaluation with pituitary function available He thinks he was tried on AndroGel initially but because of skin irritation he was changed to testosterone injections.  He may have been prescribed Axiron also but does not remember this He thinks he had significantly better with taking testosterone injections, apparently was taking about 100 mg every 2 weeks He stopped taking the injections 6-8 months prior to his initial consultation  as they were not renewed and he had a change in his PCP  RECENT history: On his initial consultation he was complaining of fatigue  Baseline testosterone was 208 with LH of 2.1 and normal prolactin, free testosterone not done by the lab  He has been on a trial of clomiphene half tablet, initially 3 times a week since 01/11/16 With this he had more energy after starting treatment With this his testosterone level came back to normal until 2/18 when it was 494  Over the last few months he has had more fatigue and sluggishness and decreased motivation although he has been able to exercise and do his running as usual.  Also complaining of decreased libido  With increasing his clomiphene and now taking 50 mg 5 days a week his testosterone level has continued to be around 300 the last 3 measurements since August He thinks he may have felt just a little better after the recent doses change but not back to normal His testosterone level was done a couple of days after he had  a viral infection with fever  He wanted to try a higher dose before going to testosterone supplement Also he thinks he has allergic reaction to applying AndroGel which he tried Not clear if he had a reaction to Axiron which was tried in 2012   He has had the following testosterone levels   Lab Results  Component Value Date   TESTOSTERONE 272.84 (L) 05/06/2017   TESTOSTERONE 302.70 03/03/2017   TESTOSTERONE 292.08 (L) 12/02/2016   TESTOSTERONE 494.2 05/29/2016    Lab Results  Component Value Date   LH 3.39 03/03/2017   LH 2.94 02/28/2016   LH 2.10 01/10/2016      Hypothyroidism was first diagnosed  about 30 years ago  At the time of diagnosis patient was having symptoms of  fatigue but he is not sure about all the other symptoms Most probably tested also because of strong family history of thyroid disease  The patient has been treated with  levothyroxine in various doses He had been on 137 g for some time and was doing subjectively fairly good until recently. More recently he had been having more complaints of feeling fatigued and having some cold intolerance which she thought was from some weight loss His weight appears to be the same this year   He takes his thyroid supplement consistently in the morning  Previously Synthroid was increased to 150 g, he takes this one tablet  every day His fatigue appears to be from low testosterone recently         Patient's weight history is as follows:  Wt Readings from Last 3 Encounters:  05/16/17 154 lb 0.6 oz (69.9 kg)  05/09/17 156 lb (70.8 kg)  05/07/17 155 lb 12 oz (70.6 kg)     Thyroid function results have been as follows:  Lab Results  Component Value Date   TSH 1.27 12/02/2016   TSH 1.60 02/28/2016   TSH 0.39 01/10/2016   FREET4 1.21 12/02/2016   FREET4 1.28 01/10/2016   FREET4 0.90 10/18/2015      Past Medical History:  Diagnosis Date  . ALLERGIC REACTION 07/22/2009  . ALLERGIC RHINITIS 10/08/2006  . B12  DEFICIENCY 05/14/2010  . Carpal tunnel syndrome 12/31/2007  . Degeneration of cervical intervertebral disc 12/31/2007  . HYPERLIPIDEMIA 10/08/2006  . HYPOTHYROIDISM 10/08/2006  . Irritable bowel syndrome 03/03/2008  . LUMBAR RADICULOPATHY, RIGHT 12/31/2006  . OSTEOARTHRITIS, LOWER LEG, LEFT 05/10/2009  . SINUSITIS - ACUTE-NOS 07/22/2009  . TESTICULAR HYPOFUNCTION 06/26/2009    Past Surgical History:  Procedure Laterality Date  . KNEE SURGERY    . LUMBAR LAMINECTOMY  2008   with revision  . SPINAL FUSION  2011  . spinal fusion revision  2012    Family History  Problem Relation Age of Onset  . Hypothyroidism Father   . Asthma Unknown   . Diabetes Unknown   . Allergy (severe) Unknown   . Hypothyroidism Mother   . Hypothyroidism Sister   . Hypothyroidism Brother     Social History:  reports that  has never smoked. he has never used smokeless tobacco. He reports that he does not drink alcohol or use drugs.  Allergies:  Allergies  Allergen Reactions  . Iodine   . Levofloxacin     Allergies as of 05/09/2017      Reactions   Iodine    Levofloxacin       Medication List        Accurate as of 05/09/17 11:59 PM. Always use your most recent med list.          ALLEGRA ALLERGY 180 MG tablet Generic drug:  fexofenadine Take 180 mg by mouth daily.   amoxicillin-clavulanate 875-125 MG tablet Commonly known as:  AUGMENTIN Take 1 tablet by mouth 2 (two) times daily.   clomiPHENE 50 MG tablet Commonly known as:  CLOMID 1 tablet 5 days a week   HYDROcodone-homatropine 5-1.5 MG/5ML syrup Commonly known as:  HYCODAN Take 5 mLs by mouth every 8 (eight) hours as needed for cough.   levothyroxine 150 MCG tablet Commonly known as:  SYNTHROID, LEVOTHROID TAKE 1 TABLET EVERY DAY BEFORE BREAKFAST **PT NEEDS AN APPOINTMENT FOR ADDITIONAL REFILLS**   multivitamin capsule Take 1 capsule by mouth daily.   Testosterone 10 MG/ACT (2%) Gel Apply 1 pump on each inner thigh every morning  then allow to dry       Review of Systems  He has had chronic insomnia        Examination:    BP 114/60   Pulse 64   Ht 5\' 10"  (1.778 m)   Wt 156 lb (70.8 kg)   SpO2 98%   BMI 22.38 kg/m    Exam not indicated  Assessment:  HYPOGONADISM:   He has hypogonadotropic hypogonadism baseline testosterone of 208 and has been symptomatic With clomiphene 50 mg, 5 days a week he has not had any further improvement in his testosterone level and  LH is also about the same Although he wants to try and increase the dose to 7 days a week discussed that this may not be long-term option and will not also improve his libido   HYPOTHYROIDISM, long-standing, taking 150 g of levothyroxine daily He will have his labs checked on next visit again   PLAN:  Discussed various options for testosterone supplementation He does not want to try Axiron again in case he had an allergic reaction previously Also he does not want to wear a patch as he is usually running without sleeves in summer  He will now try to take [place] one pump on each thigh instead of clomiphene 5 days a week  He will use a clomiphene for a week while starting this  Follow-up in 2-3 months  Sohana Austell 05/24/2017, 8:40 PM

## 2017-05-14 DIAGNOSIS — J3089 Other allergic rhinitis: Secondary | ICD-10-CM | POA: Diagnosis not present

## 2017-05-14 DIAGNOSIS — J301 Allergic rhinitis due to pollen: Secondary | ICD-10-CM | POA: Diagnosis not present

## 2017-05-15 ENCOUNTER — Telehealth: Payer: Self-pay | Admitting: Internal Medicine

## 2017-05-15 NOTE — Telephone Encounter (Signed)
Copied from CRM [phone]. Topic: Quick Communication - See Telephone Encounter >> May 15, 2017  5:01 PM Rudi CocoLathan, Mckensi Redinger M, NT wrote: CRM for notification. See Telephone encounter for:   05/15/17. Pt. Called and and still hasn't heard anything back from Dr.John And said he did not receive a voicemail. Pt. Would like for someone to call him with answers. Pt. Can be reached at

## 2017-05-15 NOTE — Telephone Encounter (Signed)
I decline, as this is not standard of care  Note will be forwarded to Dr Yetta BarreJones

## 2017-05-15 NOTE — Telephone Encounter (Signed)
Copied from CRM [phone]. Topic: Quick Communication - See Telephone Encounter >> May 15, 2017  9:10 AM Trula SladeWalter, Linda F wrote: CRM for notification. See Telephone encounter for:  05/15/17. Patient is still experiencing flu like symptoms but the fever is gone.  He stated he thinks the antibiotic he was given last week was not strong enough.  He feels he needs something stronger.  He would like for this message to go to his PCP Dr. Jonny RuizJohn instead of the provider he saw a week ago.

## 2017-05-15 NOTE — Telephone Encounter (Signed)
Called pt no answer LMOM w/MD response below.../lmb 

## 2017-05-15 NOTE — Telephone Encounter (Signed)
He should see his PCP for a recheck

## 2017-05-16 ENCOUNTER — Encounter: Payer: Self-pay | Admitting: Family

## 2017-05-16 ENCOUNTER — Ambulatory Visit (INDEPENDENT_AMBULATORY_CARE_PROVIDER_SITE_OTHER): Payer: BLUE CROSS/BLUE SHIELD | Admitting: Family

## 2017-05-16 VITALS — BP 116/72 | HR 67 | Temp 98.3°F | Ht 70.0 in | Wt 154.0 lb

## 2017-05-16 DIAGNOSIS — J0101 Acute recurrent maxillary sinusitis: Secondary | ICD-10-CM | POA: Diagnosis not present

## 2017-05-16 MED ORDER — PREDNISONE 10 MG (21) PO TBPK: ORAL_TABLET | ORAL | 0 refills | Status: DC

## 2017-05-16 MED ORDER — DOXYCYCLINE HYCLATE 100 MG PO TABS: 100.0000 mg | ORAL_TABLET | Freq: Two times a day (BID) | ORAL | 0 refills | Status: DC

## 2017-05-16 NOTE — Telephone Encounter (Signed)
See phone msg from yesterday [facility]. Per Dr. Jonny RuizJohn denied to rx stronger antibiotic. Pt will need to make f/u appt for re-evaluation. Please call pt to make MD first available.Marland Kitchen.Raechel Chute/lmb

## 2017-05-16 NOTE — Progress Notes (Signed)
Joseph Santana is a 62 y.o. male with the following history as recorded in EpicCare:  Patient Active Problem List   Diagnosis Date Noted  . Viral URI with cough 05/07/2017  . Flu-like symptoms 05/07/2017  . Acute pharyngitis 08/07/2016  . Skin lesion 10/18/2015  . Abnormality of gait 06/08/2015  . Routine general medical examination at a health care facility 03/18/2014  . Acute recurrent maxillary sinusitis 07/22/2009  . Hypogonadism in male 06/26/2009  . Sciatica associated with disorder of lumbar spine 12/31/2006  . Hypothyroidism 10/08/2006  . Borderline hyperlipidemia 10/08/2006    Current Outpatient Medications  Medication Sig Dispense Refill  . amoxicillin-clavulanate (AUGMENTIN) 875-125 MG tablet Take 1 tablet by mouth 2 (two) times daily. 20 tablet 0  . clomiPHENE (CLOMID) 50 MG tablet 1 tablet 5 days a week 24 tablet 3  . fexofenadine (ALLEGRA ALLERGY) 180 MG tablet Take 180 mg by mouth daily.    Marland Kitchen HYDROcodone-homatropine (HYCODAN) 5-1.5 MG/5ML syrup Take 5 mLs by mouth every 8 (eight) hours as needed for cough. 120 mL 0  . levothyroxine (SYNTHROID, LEVOTHROID) 150 MCG tablet TAKE 1 TABLET EVERY DAY BEFORE BREAKFAST **PT NEEDS AN APPOINTMENT FOR ADDITIONAL REFILLS** 30 tablet 0  . Multiple Vitamin (MULTIVITAMIN) capsule Take 1 capsule by mouth daily.      . Testosterone 10 MG/ACT (2%) GEL Apply 1 pump on each inner thigh every morning then allow to dry 60 g 2  . doxycycline (VIBRA-TABS) 100 MG tablet Take 1 tablet (100 mg total) by mouth 2 (two) times daily. 20 tablet 0  . predniSONE (STERAPRED UNI-PAK 21 TAB) 10 MG (21) TBPK tablet Taper as directed 21 tablet 0   No current facility-administered medications for this visit.     Allergies: Iodine and Levofloxacin  Past Medical History:  Diagnosis Date  . ALLERGIC REACTION 07/22/2009  . ALLERGIC RHINITIS 10/08/2006  . B12 DEFICIENCY 05/14/2010  . Carpal tunnel syndrome 12/31/2007  . Degeneration of cervical intervertebral  disc 12/31/2007  . HYPERLIPIDEMIA 10/08/2006  . HYPOTHYROIDISM 10/08/2006  . Irritable bowel syndrome 03/03/2008  . LUMBAR RADICULOPATHY, RIGHT 12/31/2006  . OSTEOARTHRITIS, LOWER LEG, LEFT 05/10/2009  . SINUSITIS - ACUTE-NOS 07/22/2009  . TESTICULAR HYPOFUNCTION 06/26/2009    Past Surgical History:  Procedure Laterality Date  . KNEE SURGERY    . LUMBAR LAMINECTOMY  2008   with revision  . SPINAL FUSION  2011  . spinal fusion revision  2012    Family History  Problem Relation Age of Onset  . Hypothyroidism Father   . Asthma Unknown   . Diabetes Unknown   . Allergy (severe) Unknown   . Hypothyroidism Mother   . Hypothyroidism Sister   . Hypothyroidism Brother     Social History   Tobacco Use  . Smoking status: Never Smoker  . Smokeless tobacco: Never Used  Substance Use Topics  . Alcohol use: No    Subjective:  Patient was seen on January 23 with 5 day history of flu-like symptoms progressing into sinus infection; was given Augmentin with initial benefit but symptoms have returned; feels like he needs "stronger antibiotic." He initially asks about Levaquin as his wife is on this medicine- did not realize this was listed as allergy for him; prone to recurrent sinus infections; currently on immunotherapy and still trying to find appropriate regimen; denies any fever or chest pain or shortness of breath; has continued his daily Allegra;   Objective:  Vitals:   05/16/17 1534  BP: 116/72  Pulse: 67  Temp: 98.3 F (36.8 C)  TempSrc: Oral  SpO2: 100%  Weight: 154 lb 0.6 oz (69.9 kg)  Height: 5\' 10"  (1.778 m)    General: Well developed, well nourished, in no acute distress  Skin : Warm and dry.  Head: Normocephalic and atraumatic  Eyes: Sclera and conjunctiva clear; pupils round and reactive to light; extraocular movements intact  Ears: External normal; canals clear; tympanic membranes congested bilaterally Oropharynx: Pink, supple. No suspicious lesions  Neck: Supple without  thyromegaly, adenopathy  Lungs: Respirations unlabored; clear to auscultation bilaterally without wheeze, rales, rhonchi  CVS exam: normal rate and regular rhythm.  Neurologic: Alert and oriented; speech intact; face symmetrical; moves all extremities well; CNII-XII intact without focal deficit  Assessment:  1. Acute recurrent maxillary sinusitis     Plan:  Explained to patient that cannot give Levaquin as requested due to listed as allergy; will try Doxycycline 100 mg bid x 10 days and oral prednisone taper pack; increase fluids, rest; if symptoms persist, will need to consider ENT evaluation or follow-up with his allergist.   No Follow-up on file.  No orders of the defined types were placed in this encounter.   Requested Prescriptions   Signed Prescriptions Disp Refills  . doxycycline (VIBRA-TABS) 100 MG tablet 20 tablet 0    Sig: Take 1 tablet (100 mg total) by mouth 2 (two) times daily.  . predniSONE (STERAPRED UNI-PAK 21 TAB) 10 MG (21) TBPK tablet 21 tablet 0    Sig: Taper as directed

## 2017-05-17 ENCOUNTER — Other Ambulatory Visit: Payer: Self-pay | Admitting: Internal Medicine

## 2017-05-19 DIAGNOSIS — J301 Allergic rhinitis due to pollen: Secondary | ICD-10-CM | POA: Diagnosis not present

## 2017-05-19 DIAGNOSIS — J3089 Other allergic rhinitis: Secondary | ICD-10-CM | POA: Diagnosis not present

## 2017-05-19 NOTE — Telephone Encounter (Signed)
Left patient vm to call back to schedule appt  °

## 2017-05-21 ENCOUNTER — Telehealth: Payer: Self-pay | Admitting: Internal Medicine

## 2017-05-21 NOTE — Telephone Encounter (Signed)
Called patient to get more information in regards to lab work he is requesting.  Left patient vm to call office back.

## 2017-05-21 NOTE — Telephone Encounter (Signed)
Copied from CRM [phone]. Topic: General - Other >> May 21, 2017 11:03 AM Maia Pettiesrtiz, Kristie S wrote: Reason for CRM: pt called asking if he can come in for labs to check PSA and Iron, pt was advised he may need to schedule an appt or cpe, please advise

## 2017-05-22 ENCOUNTER — Telehealth: Payer: Self-pay | Admitting: Internal Medicine

## 2017-05-22 DIAGNOSIS — Z0000 Encounter for general adult medical examination without abnormal findings: Secondary | ICD-10-CM

## 2017-05-22 NOTE — Telephone Encounter (Signed)
Pt has been informed and expressed understanding.  

## 2017-05-22 NOTE — Telephone Encounter (Signed)
Patient is scheduled for upcoming CPE.  Patient is requesting an order for Iron and PSA to be entered.  Dr. Lucianne MussKumar does testosterone and tyroid checks.

## 2017-05-22 NOTE — Telephone Encounter (Signed)
Ok for labs ordered for CPX as usual except for the labs followed per Dr Lucianne MussKumar - done

## 2017-05-23 ENCOUNTER — Telehealth: Payer: Self-pay | Admitting: Endocrinology

## 2017-05-23 ENCOUNTER — Other Ambulatory Visit (INDEPENDENT_AMBULATORY_CARE_PROVIDER_SITE_OTHER): Payer: BLUE CROSS/BLUE SHIELD

## 2017-05-23 DIAGNOSIS — Z0000 Encounter for general adult medical examination without abnormal findings: Secondary | ICD-10-CM

## 2017-05-23 LAB — URINALYSIS, ROUTINE W REFLEX MICROSCOPIC
Bilirubin Urine: NEGATIVE
Hgb urine dipstick: NEGATIVE
Ketones, ur: NEGATIVE
Leukocytes, UA: NEGATIVE
Nitrite: NEGATIVE
RBC / HPF: NONE SEEN (ref 0–?)
SPECIFIC GRAVITY, URINE: 1.01 (ref 1.000–1.030)
TOTAL PROTEIN, URINE-UPE24: NEGATIVE
URINE GLUCOSE: NEGATIVE
Urobilinogen, UA: 0.2 (ref 0.0–1.0)
pH: 7.5 (ref 5.0–8.0)

## 2017-05-23 LAB — BASIC METABOLIC PANEL
BUN: 16 mg/dL (ref 6–23)
CHLORIDE: 100 meq/L (ref 96–112)
CO2: 32 meq/L (ref 19–32)
Calcium: 8.8 mg/dL (ref 8.4–10.5)
Creatinine, Ser: 0.84 mg/dL (ref 0.40–1.50)
GFR: 99.28 mL/min (ref >60.00)
GLUCOSE: 80 mg/dL (ref 70–99)
POTASSIUM: 4.4 meq/L (ref 3.5–5.1)
Sodium: 138 mEq/L (ref 135–145)

## 2017-05-23 LAB — HEPATIC FUNCTION PANEL
ALT: 20 U/L (ref 0–53)
AST: 17 U/L (ref 0–37)
Albumin: 3.5 g/dL (ref 3.5–5.2)
Alkaline Phosphatase: 47 U/L (ref 39–117)
BILIRUBIN DIRECT: 0.1 mg/dL (ref 0.0–0.3)
BILIRUBIN TOTAL: 0.4 mg/dL (ref 0.2–1.2)
Total Protein: 5.9 g/dL — ABNORMAL LOW (ref 6.0–8.3)

## 2017-05-23 LAB — LIPID PANEL
CHOL/HDL RATIO: 3
Cholesterol: 151 mg/dL (ref 0–200)
HDL: 47.6 mg/dL (ref >39.00)
LDL CALC: 91 mg/dL (ref 0–99)
NONHDL: 103.5
Triglycerides: 64 mg/dL (ref 0.0–149.0)
VLDL: 12.8 mg/dL (ref 0.0–40.0)

## 2017-05-23 LAB — PSA: PSA: 2.45 ng/mL (ref 0.10–4.00)

## 2017-05-23 NOTE — Telephone Encounter (Signed)
Need a 90 day rx on thyroid medication.  Didn't say what medication or pharmacy, Called [facility]

## 2017-05-23 NOTE — Telephone Encounter (Signed)
Please advise if okay to fill levothyroxine 150 mcg. Last filled by Dr. Jonny RuizJohn

## 2017-05-24 ENCOUNTER — Other Ambulatory Visit: Payer: Self-pay | Admitting: Endocrinology

## 2017-05-24 MED ORDER — LEVOTHYROXINE SODIUM 150 MCG PO TABS: ORAL_TABLET | ORAL | 1 refills | Status: DC

## 2017-05-26 DIAGNOSIS — J3089 Other allergic rhinitis: Secondary | ICD-10-CM | POA: Diagnosis not present

## 2017-05-26 DIAGNOSIS — J301 Allergic rhinitis due to pollen: Secondary | ICD-10-CM | POA: Diagnosis not present

## 2017-05-27 ENCOUNTER — Encounter: Payer: Self-pay | Admitting: Internal Medicine

## 2017-06-03 ENCOUNTER — Ambulatory Visit (INDEPENDENT_AMBULATORY_CARE_PROVIDER_SITE_OTHER): Payer: BLUE CROSS/BLUE SHIELD | Admitting: Internal Medicine

## 2017-06-03 ENCOUNTER — Other Ambulatory Visit (INDEPENDENT_AMBULATORY_CARE_PROVIDER_SITE_OTHER): Payer: BLUE CROSS/BLUE SHIELD

## 2017-06-03 ENCOUNTER — Encounter: Payer: Self-pay | Admitting: Internal Medicine

## 2017-06-03 VITALS — BP 114/82 | HR 63 | Temp 97.7°F | Ht 70.0 in | Wt 152.0 lb

## 2017-06-03 DIAGNOSIS — R5383 Other fatigue: Secondary | ICD-10-CM

## 2017-06-03 DIAGNOSIS — J301 Allergic rhinitis due to pollen: Secondary | ICD-10-CM | POA: Diagnosis not present

## 2017-06-03 DIAGNOSIS — Z0000 Encounter for general adult medical examination without abnormal findings: Secondary | ICD-10-CM | POA: Diagnosis not present

## 2017-06-03 DIAGNOSIS — J3089 Other allergic rhinitis: Secondary | ICD-10-CM | POA: Diagnosis not present

## 2017-06-03 DIAGNOSIS — E538 Deficiency of other specified B group vitamins: Secondary | ICD-10-CM

## 2017-06-03 DIAGNOSIS — E559 Vitamin D deficiency, unspecified: Secondary | ICD-10-CM

## 2017-06-03 LAB — IBC PANEL
IRON: 111 ug/dL (ref 42–165)
SATURATION RATIOS: 32.2 % (ref 20.0–50.0)
TRANSFERRIN: 246 mg/dL (ref 212.0–360.0)

## 2017-06-03 LAB — CBC WITH DIFFERENTIAL/PLATELET
BASOS ABS: 0.1 10*3/uL (ref 0.0–0.1)
Basophils Relative: 1.9 % (ref 0.0–3.0)
EOS ABS: 0.2 10*3/uL (ref 0.0–0.7)
Eosinophils Relative: 3.3 % (ref 0.0–5.0)
HCT: 39.7 % (ref 39.0–52.0)
Hemoglobin: 13.6 g/dL (ref 13.0–17.0)
LYMPHS ABS: 1.6 10*3/uL (ref 0.7–4.0)
Lymphocytes Relative: 27.4 % (ref 12.0–46.0)
MCHC: 34.3 g/dL (ref 30.0–36.0)
MCV: 88.7 fl (ref 78.0–100.0)
MONO ABS: 0.6 10*3/uL (ref 0.1–1.0)
MONOS PCT: 10.2 % (ref 3.0–12.0)
NEUTROS ABS: 3.2 10*3/uL (ref 1.4–7.7)
NEUTROS PCT: 57.2 % (ref 43.0–77.0)
PLATELETS: 254 10*3/uL (ref 150.0–400.0)
RBC: 4.48 Mil/uL (ref 4.22–5.81)
RDW: 13.5 % (ref 11.5–15.5)
WBC: 5.7 10*3/uL (ref 4.0–10.5)

## 2017-06-03 LAB — VITAMIN D 25 HYDROXY (VIT D DEFICIENCY, FRACTURES): VITD: 45.63 ng/mL (ref 30.00–100.00)

## 2017-06-03 LAB — VITAMIN B12

## 2017-06-03 LAB — MAGNESIUM: MAGNESIUM: 2.1 mg/dL (ref 1.5–2.5)

## 2017-06-03 NOTE — Assessment & Plan Note (Signed)

## 2017-06-03 NOTE — Patient Instructions (Signed)

## 2017-06-03 NOTE — Assessment & Plan Note (Signed)
For f/u lab today per pt reqeust

## 2017-06-03 NOTE — Progress Notes (Signed)
Subjective:    Patient ID: Joseph Santana, male    DOB: 12-01-1955, 62 y.o.   MRN: [id number]  HPI  Here for wellness and f/u;  Overall doing ok;  Pt denies Chest pain, worsening SOB, DOE, wheezing, orthopnea, PND, worsening LE edema, palpitations, dizziness or syncope.  Pt denies neurological change such as new headache, facial or extremity weakness.  Pt denies polydipsia, polyuria, or low sugar symptoms. Pt states overall good compliance with treatment and medications, good tolerability, and has been trying to follow appropriate diet.  Pt denies worsening depressive symptoms, suicidal ideation or panic. No fever, night sweats, wt loss, loss of appetite, or other constitutional symptoms.  Pt states good ability with ADL's, has low fall risk, home safety reviewed and adequate, no other significant changes in hearing or vision, and remains very active with exercise, has done 5 marathons in the past yr and qualified for [facility] next April.  No new complaints or interval hx Past Medical History:  Diagnosis Date  . ALLERGIC REACTION 07/22/2009  . ALLERGIC RHINITIS 10/08/2006  . B12 DEFICIENCY 05/14/2010  . Carpal tunnel syndrome 12/31/2007  . Degeneration of cervical intervertebral disc 12/31/2007  . HYPERLIPIDEMIA 10/08/2006  . HYPOTHYROIDISM 10/08/2006  . Irritable bowel syndrome 03/03/2008  . LUMBAR RADICULOPATHY, RIGHT 12/31/2006  . OSTEOARTHRITIS, LOWER LEG, LEFT 05/10/2009  . SINUSITIS - ACUTE-NOS 07/22/2009  . TESTICULAR HYPOFUNCTION 06/26/2009   Past Surgical History:  Procedure Laterality Date  . KNEE SURGERY    . LUMBAR LAMINECTOMY  2008   with revision  . SPINAL FUSION  2011  . spinal fusion revision  2012    reports that  has never smoked. he has never used smokeless tobacco. He reports that he does not drink alcohol or use drugs. family history includes Allergy (severe) in his unknown relative; Asthma in his unknown relative; Diabetes in his unknown relative; Hypothyroidism in his brother,  father, mother, and sister. Allergies  Allergen Reactions  . Iodine   . Levofloxacin    Current Outpatient Medications on File Prior to Visit  Medication Sig Dispense Refill  . fexofenadine (ALLEGRA ALLERGY) 180 MG tablet Take 180 mg by mouth daily.    Marland Kitchen levothyroxine (SYNTHROID, LEVOTHROID) 150 MCG tablet 1 tablet 30 minutes before breakfast daily 90 tablet 1  . Multiple Vitamin (MULTIVITAMIN) capsule Take 1 capsule by mouth daily.      . Testosterone 10 MG/ACT (2%) GEL Apply 1 pump on each inner thigh every morning then allow to dry 60 g 2   No current facility-administered medications on file prior to visit.    Review of Systems Constitutional: Negative for other unusual diaphoresis, sweats, appetite or weight changes HENT: Negative for other worsening hearing loss, ear pain, facial swelling, mouth sores or neck stiffness.   Eyes: Negative for other worsening pain, redness or other visual disturbance.  Respiratory: Negative for other stridor or swelling Cardiovascular: Negative for other palpitations or other chest pain  Gastrointestinal: Negative for worsening diarrhea or loose stools, blood in stool, distention or other pain Genitourinary: Negative for hematuria, flank pain or other change in urine volume.  Musculoskeletal: Negative for myalgias or other joint swelling.  Skin: Negative for other color change, or other wound or worsening drainage.  Neurological: Negative for other syncope or numbness. Hematological: Negative for other adenopathy or swelling Psychiatric/Behavioral: Negative for hallucinations, other worsening agitation, SI, self-injury, or new decreased concentration All other system neg per pt    Objective:   Physical Exam BP 114/82  Pulse 63   Temp 97.7 F (36.5 C) (Oral)   Ht 5\' 10"  (1.778 m)   Wt 152 lb (68.9 kg)   SpO2 98%   BMI 21.81 kg/m  VS noted,  Constitutional: Pt is oriented to person, place, and time. Appears well-developed and  well-nourished, in no significant distress and comfortable Head: Normocephalic and atraumatic  Eyes: Conjunctivae and EOM are normal. Pupils are equal, round, and reactive to light Right Ear: External ear normal without discharge Left Ear: External ear normal without discharge Nose: Nose without discharge or deformity Mouth/Throat: Oropharynx is without other ulcerations and moist  Neck: Normal range of motion. Neck supple. No JVD present. No tracheal deviation present or significant neck LA or mass Cardiovascular: Normal rate, regular rhythm, normal heart sounds and intact distal pulses.   Pulmonary/Chest: WOB normal and breath sounds without rales or wheezing  Abdominal: Soft. Bowel sounds are normal. NT. No HSM  Musculoskeletal: Normal range of motion. Exhibits no edema Lymphadenopathy: Has no other cervical adenopathy.  Neurological: Pt is alert and oriented to person, place, and time. Pt has normal reflexes. No cranial nerve deficit. Motor grossly intact, Gait intact Skin: Skin is warm and dry. No rash noted or new ulcerations Psychiatric:  Has normal mood and affect. Behavior is normal without agitation No other exam findings Lab Results  Component Value Date   WBC 5.7 06/03/2017   HGB 13.6 06/03/2017   HCT 39.7 06/03/2017   PLT 254.0 06/03/2017   GLUCOSE 80 05/23/2017   CHOL 151 05/23/2017   TRIG 64.0 05/23/2017   HDL 47.60 05/23/2017   LDLDIRECT 146.2 11/19/2012   LDLCALC 91 05/23/2017   ALT 20 05/23/2017   AST 17 05/23/2017   NA 138 05/23/2017   K 4.4 05/23/2017   CL 100 05/23/2017   CREATININE 0.84 05/23/2017   BUN 16 05/23/2017   CO2 32 05/23/2017   TSH 1.27 12/02/2016   PSA 2.45 05/23/2017       Assessment & Plan:

## 2017-06-09 DIAGNOSIS — J301 Allergic rhinitis due to pollen: Secondary | ICD-10-CM | POA: Diagnosis not present

## 2017-06-09 DIAGNOSIS — J3089 Other allergic rhinitis: Secondary | ICD-10-CM | POA: Diagnosis not present

## 2017-06-16 DIAGNOSIS — J3089 Other allergic rhinitis: Secondary | ICD-10-CM | POA: Diagnosis not present

## 2017-06-16 DIAGNOSIS — J301 Allergic rhinitis due to pollen: Secondary | ICD-10-CM | POA: Diagnosis not present

## 2017-06-18 DIAGNOSIS — J301 Allergic rhinitis due to pollen: Secondary | ICD-10-CM | POA: Diagnosis not present

## 2017-06-19 DIAGNOSIS — J3089 Other allergic rhinitis: Secondary | ICD-10-CM | POA: Diagnosis not present

## 2017-06-24 DIAGNOSIS — J3089 Other allergic rhinitis: Secondary | ICD-10-CM | POA: Diagnosis not present

## 2017-06-24 DIAGNOSIS — J301 Allergic rhinitis due to pollen: Secondary | ICD-10-CM | POA: Diagnosis not present

## 2017-06-25 ENCOUNTER — Telehealth: Payer: BLUE CROSS/BLUE SHIELD | Admitting: Nurse Practitioner

## 2017-06-25 DIAGNOSIS — R059 Cough, unspecified: Secondary | ICD-10-CM

## 2017-06-25 DIAGNOSIS — R05 Cough: Secondary | ICD-10-CM

## 2017-06-25 MED ORDER — AZITHROMYCIN 250 MG PO TABS: ORAL_TABLET | ORAL | 0 refills | Status: DC

## 2017-06-25 NOTE — Progress Notes (Signed)

## 2017-06-30 DIAGNOSIS — J3089 Other allergic rhinitis: Secondary | ICD-10-CM | POA: Diagnosis not present

## 2017-06-30 DIAGNOSIS — J301 Allergic rhinitis due to pollen: Secondary | ICD-10-CM | POA: Diagnosis not present

## 2017-07-02 DIAGNOSIS — M25552 Pain in left hip: Secondary | ICD-10-CM | POA: Diagnosis not present

## 2017-07-02 DIAGNOSIS — M25562 Pain in left knee: Secondary | ICD-10-CM | POA: Diagnosis not present

## 2017-07-03 ENCOUNTER — Other Ambulatory Visit: Payer: BLUE CROSS/BLUE SHIELD

## 2017-07-07 ENCOUNTER — Ambulatory Visit: Payer: BLUE CROSS/BLUE SHIELD | Admitting: Endocrinology

## 2017-07-08 DIAGNOSIS — J301 Allergic rhinitis due to pollen: Secondary | ICD-10-CM | POA: Diagnosis not present

## 2017-07-08 DIAGNOSIS — J3089 Other allergic rhinitis: Secondary | ICD-10-CM | POA: Diagnosis not present

## 2017-07-10 ENCOUNTER — Ambulatory Visit (INDEPENDENT_AMBULATORY_CARE_PROVIDER_SITE_OTHER): Payer: BLUE CROSS/BLUE SHIELD | Admitting: Sports Medicine

## 2017-07-10 ENCOUNTER — Encounter: Payer: Self-pay | Admitting: Sports Medicine

## 2017-07-10 VITALS — BP 112/70 | Ht 70.0 in | Wt 145.0 lb

## 2017-07-10 DIAGNOSIS — M79605 Pain in left leg: Secondary | ICD-10-CM | POA: Diagnosis not present

## 2017-07-10 DIAGNOSIS — M79672 Pain in left foot: Secondary | ICD-10-CM | POA: Insufficient documentation

## 2017-07-10 NOTE — Assessment & Plan Note (Signed)
This left leg pain is biomechanical due to heavier reliance on the left leg during running combined with his high volume. The calf cramping is most likely from the muscle fatiguing. He also has a component of IT band syndrome from relatively weak hip abductors. He will probably benefit from orthotics indefinitely to help compensate for this stride pattern. Exercise instructions provided for strengthening and ROM in hip abductors and calf strengthening. He will bring his current running shoes andorthotics by to get checked out sometime soon and if they need replacing would be a good option.

## 2017-07-10 NOTE — Progress Notes (Signed)
   HPI  Mr. Joseph Santana is here today to discuss his left knee and left foot pain that is particularly bothering him since his last marathon ran on 3/9. He wears orthotic inserts plus heel elevation in his shoes. His pain is worst over the superior and lateral part of the knee. He also has been feeling calf cramps that get worse after about 10-20 miles into exercise. He is running about 40-50 miles weekly in training and ran 5 marathons in the past 12 months. He is also feeling some plantar fasciitis pain which he has had before. He is working out 1 day weekly with leg extension, flexion, straight leg press on exercise machines.  CC: Left knee and foot pain   Medications/Interventions Tried: none  See HPI and/or previous note for associated ROS. Other ROS - no locking or giving way of knee/ no swelling  Objective: BP 112/70   Ht 5\' 10"  (1.778 m)   Wt 145 lb (65.8 kg)   BMI 20.81 kg/m  Gen: NAD, well groomed, a/o x3, normal affect.  CV: Well-perfused. Warm.  Resp: Non-labored.  Neuro: Sensation intact throughout. Gait: Nonpathologic posture, Walking gait appears neutral, running gait shows mild pronation of the right foot/ankle and external rotation, with compensatory internal rotation of the left leg MSK: There is no tenderness to palpation in the knee, hip flexors quads hamstring are good, lateral straight leg lift is weak on the right and very weak on the left the left foot is tender to pressure anterior to the heel  Assessment and plan:  Left leg pain This left leg pain is biomechanical due to heavier reliance on the left leg during running combined with his high volume. The calf cramping is most likely from the muscle fatiguing. He also has a component of IT band syndrome from relatively weak hip abductors. He will probably benefit from orthotics indefinitely to help compensate for this stride pattern. Exercise instructions provided for strengthening and ROM in hip abductors and calf  strengthening. He will bring his current running shoes andorthotics by to get checked out sometime soon and if they need replacing would be a good option.  Fuller Planhristopher W Rice, MD PGY-III Internal Medicine Resident 07/10/2017, 6:19 PM  I observed and examined the patient with the resident and agree with assessment and plan.  Note reviewed and modified by me. Enid BaasKarl Lakaisha Danish, MD

## 2017-07-14 DIAGNOSIS — J301 Allergic rhinitis due to pollen: Secondary | ICD-10-CM | POA: Diagnosis not present

## 2017-07-14 DIAGNOSIS — J3089 Other allergic rhinitis: Secondary | ICD-10-CM | POA: Diagnosis not present

## 2017-07-21 DIAGNOSIS — J3089 Other allergic rhinitis: Secondary | ICD-10-CM | POA: Diagnosis not present

## 2017-07-21 DIAGNOSIS — J301 Allergic rhinitis due to pollen: Secondary | ICD-10-CM | POA: Diagnosis not present

## 2017-07-23 ENCOUNTER — Other Ambulatory Visit (INDEPENDENT_AMBULATORY_CARE_PROVIDER_SITE_OTHER): Payer: BLUE CROSS/BLUE SHIELD

## 2017-07-23 DIAGNOSIS — E039 Hypothyroidism, unspecified: Secondary | ICD-10-CM | POA: Diagnosis not present

## 2017-07-23 DIAGNOSIS — E230 Hypopituitarism: Secondary | ICD-10-CM | POA: Diagnosis not present

## 2017-07-23 LAB — TSH: TSH: 1.54 u[IU]/mL (ref 0.35–4.50)

## 2017-07-23 LAB — TESTOSTERONE: TESTOSTERONE: 139.98 ng/dL — AB (ref 300.00–890.00)

## 2017-07-23 LAB — CBC
HCT: 41.6 % (ref 39.0–52.0)
Hemoglobin: 14.1 g/dL (ref 13.0–17.0)
MCHC: 33.9 g/dL (ref 30.0–36.0)
MCV: 87.4 fl (ref 78.0–100.0)
PLATELETS: 230 10*3/uL (ref 150.0–400.0)
RBC: 4.76 Mil/uL (ref 4.22–5.81)
RDW: 13.1 % (ref 11.5–15.5)
WBC: 5.2 10*3/uL (ref 4.0–10.5)

## 2017-07-28 DIAGNOSIS — J301 Allergic rhinitis due to pollen: Secondary | ICD-10-CM | POA: Diagnosis not present

## 2017-07-28 DIAGNOSIS — J3089 Other allergic rhinitis: Secondary | ICD-10-CM | POA: Diagnosis not present

## 2017-07-29 ENCOUNTER — Ambulatory Visit (INDEPENDENT_AMBULATORY_CARE_PROVIDER_SITE_OTHER): Payer: BLUE CROSS/BLUE SHIELD | Admitting: Endocrinology

## 2017-07-29 ENCOUNTER — Encounter: Payer: Self-pay | Admitting: Endocrinology

## 2017-07-29 VITALS — BP 120/70 | HR 70 | Ht 70.0 in | Wt 153.4 lb

## 2017-07-29 DIAGNOSIS — E230 Hypopituitarism: Secondary | ICD-10-CM | POA: Diagnosis not present

## 2017-07-29 MED ORDER — TESTOSTERONE 10 MG/ACT (2%) TD GEL: TRANSDERMAL | 2 refills | Status: DC

## 2017-07-29 NOTE — Progress Notes (Addendum)
Patient ID: Joseph Santana, male   DOB: [birth year], 62 y.o.   MRN: [id number]            Reason for Appointment:  Follow-up of low testosterone     History of Present Illness:   HYPOGONADISM: Prior history: He was diagnosed to have a low testosterone level in 2012 or so when he was coming to his physician with complaints of increased fatigue.  Not clear what his baseline testosterone level was but he had a level of about 130 in 2013 around the time when he was having back surgery No record of detail evaluation with pituitary function available He thinks he was tried on AndroGel initially but because of skin irritation he was changed to testosterone injections.  He may have been prescribed Axiron also but does not remember this He thinks he had significantly better with taking testosterone injections, apparently was taking about 100 mg every 2 weeks He stopped taking the injections 6-8 months prior to his initial consultation  as they were not renewed and he had a change in his PCP  RECENT history: On his initial consultation he was complaining of fatigue  Baseline testosterone was 208 with LH of 2.1 and normal prolactin, free testosterone not done by the lab  He had been on a trial of clomiphene half tablet, initially 3 times a week since 01/11/16 With this he had more energy after starting treatment With this his testosterone level came back to normal until 2/18 when it was 494, subsequently had been progressively declining and was 273 done in 1/19 This was despite taking 50 mg 5 days a week  On his last visit he was complaining of more fatigue and sluggishness and decreased motivation   Also complaining of decreased libido  He is now taking Fortesta gel and applying 1 pump on each inner thigh This however was starting to cause redness and irritation He is using OTC cream with Eucerin/oatmeal extract which he applies at night and relieves the irritation He is applying the gel  consistently in the morning after the shower  He does however go running in the late afternoon and will shower again  Even though he has had a much lower testosterone this time he does not think he has felt any more tired than before and cannot tell any changes in libido  He has had the following testosterone levels   Lab Results  Component Value Date   TESTOSTERONE 139.98 (L) 07/23/2017   TESTOSTERONE 272.84 (L) 05/06/2017   TESTOSTERONE 302.70 03/03/2017   TESTOSTERONE 292.08 (L) 12/02/2016    Lab Results  Component Value Date   LH 3.39 03/03/2017   LH 2.94 02/28/2016   LH 2.10 01/10/2016      Hypothyroidism was first diagnosed  about 30 years ago  At the time of diagnosis patient was having symptoms of  fatigue but he is not sure about all the other symptoms Most probably tested also because of strong family history of thyroid disease  The patient has been treated with  levothyroxine in various doses He had been on 137 g for some time and was doing subjectively fairly good until recently. More recently he had been having more complaints of feeling fatigued and having some cold intolerance which she thought was from some weight loss His weight appears to be the same this year   He takes his thyroid supplement consistently in the morning  Previously Synthroid was increased to 150 g, he takes this one  tablet every day His fatigue appears to be from low testosterone recently         Patient's weight history is as follows:  Wt Readings from Last 3 Encounters:  07/29/17 153 lb 6.4 oz (69.6 kg)  07/10/17 145 lb (65.8 kg)  06/03/17 152 lb (68.9 kg)     Thyroid function results have been as follows:  Lab Results  Component Value Date   TSH 1.54 07/23/2017   TSH 1.27 12/02/2016   TSH 1.60 02/28/2016   FREET4 1.21 12/02/2016   FREET4 1.28 01/10/2016   FREET4 0.90 10/18/2015      Past Medical History:  Diagnosis Date  . ALLERGIC REACTION 07/22/2009  . ALLERGIC  RHINITIS 10/08/2006  . B12 DEFICIENCY 05/14/2010  . Carpal tunnel syndrome 12/31/2007  . Degeneration of cervical intervertebral disc 12/31/2007  . HYPERLIPIDEMIA 10/08/2006  . HYPOTHYROIDISM 10/08/2006  . Irritable bowel syndrome 03/03/2008  . LUMBAR RADICULOPATHY, RIGHT 12/31/2006  . OSTEOARTHRITIS, LOWER LEG, LEFT 05/10/2009  . SINUSITIS - ACUTE-NOS 07/22/2009  . TESTICULAR HYPOFUNCTION 06/26/2009    Past Surgical History:  Procedure Laterality Date  . KNEE SURGERY    . LUMBAR LAMINECTOMY  2008   with revision  . SPINAL FUSION  2011  . spinal fusion revision  2012    Family History  Problem Relation Age of Onset  . Hypothyroidism Father   . Asthma Unknown   . Diabetes Unknown   . Allergy (severe) Unknown   . Hypothyroidism Mother   . Hypothyroidism Sister   . Hypothyroidism Brother     Social History:  reports that he has never smoked. He has never used smokeless tobacco. He reports that he does not drink alcohol or use drugs.  Allergies:  Allergies  Allergen Reactions  . Iodine   . Levofloxacin     Allergies as of 07/29/2017      Reactions   Iodine    Levofloxacin       Medication List        Accurate as of 07/29/17  9:14 AM. Always use your most recent med list.          ALLEGRA ALLERGY 180 MG tablet Generic drug:  fexofenadine Take 180 mg by mouth daily.   azithromycin 250 MG tablet Commonly known as:  ZITHROMAX Z-PAK As directed   fluticasone 50 MCG/ACT nasal spray Commonly known as:  FLONASE SPRAY 1 SPRAY INTO EACH NOSTRIL EVERY DAY   levothyroxine 150 MCG tablet Commonly known as:  SYNTHROID, LEVOTHROID 1 tablet 30 minutes before breakfast daily   loratadine 10 MG tablet Commonly known as:  CLARITIN Take 10 mg by mouth daily.   multivitamin capsule Take 1 capsule by mouth daily.   Testosterone 10 MG/ACT (2%) Gel Apply 1 pump on each inner thigh every morning then allow to dry       Review of Systems  He has had chronic insomnia          Examination:    BP 120/70 (BP Location: Left Arm, Patient Position: Sitting, Cuff Size: Normal)   Pulse 70   Ht 5\' 10"  (1.778 m)   Wt 153 lb 6.4 oz (69.6 kg)   SpO2 98%   BMI 22.01 kg/m    Exam not indicated  Assessment:  HYPOGONADISM:   He has hypogonadotropic hypogonadism baseline testosterone of 208 and has been symptomatic He is now taking [place] gel instead of clomiphene  Surprisingly his testosterone level is much lower at 139 He is not complaining of  any unusual fatigue but he tends to get tired after his running 5 days a week Currently not having any skin reaction with his using OTC soothing cream He is asking about showering a second time in the late afternoon and whether this would affect the levels Review of the medication literature indicates that if he is showering more than 2 hours after application of the gel this will lower the levels only about 3%   HYPOTHYROIDISM, long-standing, taking 150 g of levothyroxine daily Has good levels   PLAN:  He will increase dosage of Fortesta to 2 pumps on each leg Recheck level in 6 weeks No change in levothyroxine  Reather LittlerAjay Cosandra Plouffe 07/29/2017, 9:14 AM

## 2017-08-04 DIAGNOSIS — J301 Allergic rhinitis due to pollen: Secondary | ICD-10-CM | POA: Diagnosis not present

## 2017-08-04 DIAGNOSIS — J3089 Other allergic rhinitis: Secondary | ICD-10-CM | POA: Diagnosis not present

## 2017-08-11 DIAGNOSIS — J3089 Other allergic rhinitis: Secondary | ICD-10-CM | POA: Diagnosis not present

## 2017-08-11 DIAGNOSIS — J301 Allergic rhinitis due to pollen: Secondary | ICD-10-CM | POA: Diagnosis not present

## 2017-08-18 DIAGNOSIS — J3089 Other allergic rhinitis: Secondary | ICD-10-CM | POA: Diagnosis not present

## 2017-08-18 DIAGNOSIS — J301 Allergic rhinitis due to pollen: Secondary | ICD-10-CM | POA: Diagnosis not present

## 2017-08-25 DIAGNOSIS — J301 Allergic rhinitis due to pollen: Secondary | ICD-10-CM | POA: Diagnosis not present

## 2017-08-25 DIAGNOSIS — J3089 Other allergic rhinitis: Secondary | ICD-10-CM | POA: Diagnosis not present

## 2017-09-01 DIAGNOSIS — J3089 Other allergic rhinitis: Secondary | ICD-10-CM | POA: Diagnosis not present

## 2017-09-01 DIAGNOSIS — J301 Allergic rhinitis due to pollen: Secondary | ICD-10-CM | POA: Diagnosis not present

## 2017-09-15 DIAGNOSIS — J3089 Other allergic rhinitis: Secondary | ICD-10-CM | POA: Diagnosis not present

## 2017-09-15 DIAGNOSIS — J301 Allergic rhinitis due to pollen: Secondary | ICD-10-CM | POA: Diagnosis not present

## 2017-09-16 ENCOUNTER — Other Ambulatory Visit (INDEPENDENT_AMBULATORY_CARE_PROVIDER_SITE_OTHER): Payer: BLUE CROSS/BLUE SHIELD

## 2017-09-16 DIAGNOSIS — E230 Hypopituitarism: Secondary | ICD-10-CM

## 2017-09-16 LAB — TESTOSTERONE: Testosterone: >1620 ng/dL — ABNORMAL HIGH (ref 300.00–890.00)

## 2017-09-16 NOTE — Progress Notes (Signed)
Patient ID: Joseph Santana, male   DOB: 07/25/1955, 62 y.o.   MRN: [id number]            Reason for Appointment:  Follow-up of low testosterone     History of Present Illness:   HYPOGONADISM: Prior history: He was diagnosed to have a low testosterone level in 2012 or so when he was coming to his physician with complaints of increased fatigue.  Not clear what his baseline testosterone level was but he had a level of about 130 in 2013 around the time when he was having back surgery No record of detail evaluation with pituitary function available He thinks he was tried on AndroGel initially but because of skin irritation he was changed to testosterone injections.  He may have been prescribed Axiron also but does not remember this He thinks he had significantly better with taking testosterone injections, apparently was taking about 100 mg every 2 weeks He stopped taking the injections 6-8 months prior to his initial consultation  as they were not renewed and he had a change in his PCP  RECENT history: On his initial consultation he was complaining of fatigue  Baseline testosterone was 208 with LH of 2.1 and normal prolactin, free testosterone not done by the lab  He had been on a trial of clomiphene half tablet, initially 3 times a week since 01/11/16 With this he had more energy after starting treatment With this his testosterone level came back to normal until 2/18 when it was 494, subsequently had been progressively declining and was 273 done in 1/19 This was despite taking 50 mg 5 days a week; with this he was complaining of more fatigue and sluggishness and decreased motivation   Also complaining of decreased libido  RECENT history: He is subsequently taking Fortesta gel  On his last visit he was applying 1 pump on each inner thigh This however was starting to cause redness and irritation with blinking like sunburn but no itching He is using OTC cream with Eucerin/oatmeal extract  which he applies at night and relieves the irritation He is applying the gel consistently in the morning after shower He does however go running in the late afternoon and will shower again  Unclear why his level was significantly low in April even with taking his testosterone location regularly Not feeling unusually tired at that time Because of the low level he was told to increase the testosterone to 2 pumps on each leg Also recently has not been applying the OTC cream on the rash even though he has some rash issues going on  Not able to explain why his level is significantly higher now He is not very keen on starting injections  He has had the following testosterone levels   Lab Results  Component Value Date   TESTOSTERONE >1620.00 (H) 09/16/2017   TESTOSTERONE 139.98 (L) 07/23/2017   TESTOSTERONE 272.84 (L) 05/06/2017   TESTOSTERONE 302.70 03/03/2017    Lab Results  Component Value Date   LH 3.39 03/03/2017   LH 2.94 02/28/2016   LH 2.10 01/10/2016      Hypothyroidism was first diagnosed  about 30 years ago  At the time of diagnosis patient was having symptoms of  fatigue but he is not sure about all the other symptoms Most probably tested also because of strong family history of thyroid disease  The patient has been treated with  levothyroxine in various doses He had been on 137 g for some time and was  doing subjectively fairly good until recently. More recently he had been having more complaints of feeling fatigued and having some cold intolerance which she thought was from some weight loss His weight appears to be the same this year   He takes his thyroid supplement consistently in the morning  Previously Synthroid was increased to 150 g, he takes this one tablet every day His fatigue appears to be from low testosterone recently         Patient's weight history is as follows:  Wt Readings from Last 3 Encounters:  07/29/17 153 lb 6.4 oz (69.6 kg)  07/10/17 145  lb (65.8 kg)  06/03/17 152 lb (68.9 kg)     Thyroid function results have been as follows:  Lab Results  Component Value Date   TSH 1.54 07/23/2017   TSH 1.27 12/02/2016   TSH 1.60 02/28/2016   FREET4 1.21 12/02/2016   FREET4 1.28 01/10/2016   FREET4 0.90 10/18/2015      Past Medical History:  Diagnosis Date  . ALLERGIC REACTION 07/22/2009  . ALLERGIC RHINITIS 10/08/2006  . B12 DEFICIENCY 05/14/2010  . Carpal tunnel syndrome 12/31/2007  . Degeneration of cervical intervertebral disc 12/31/2007  . HYPERLIPIDEMIA 10/08/2006  . HYPOTHYROIDISM 10/08/2006  . Irritable bowel syndrome 03/03/2008  . LUMBAR RADICULOPATHY, RIGHT 12/31/2006  . OSTEOARTHRITIS, LOWER LEG, LEFT 05/10/2009  . SINUSITIS - ACUTE-NOS 07/22/2009  . TESTICULAR HYPOFUNCTION 06/26/2009    Past Surgical History:  Procedure Laterality Date  . KNEE SURGERY    . LUMBAR LAMINECTOMY  2008   with revision  . SPINAL FUSION  2011  . spinal fusion revision  2012    Family History  Problem Relation Age of Onset  . Hypothyroidism Father   . Asthma Unknown   . Diabetes Unknown   . Allergy (severe) Unknown   . Hypothyroidism Mother   . Hypothyroidism Sister   . Hypothyroidism Brother     Social History:  reports that he has never smoked. He has never used smokeless tobacco. He reports that he does not drink alcohol or use drugs.  Allergies:  Allergies  Allergen Reactions  . Iodine   . Levofloxacin     Allergies as of 09/17/2017      Reactions   Iodine    Levofloxacin       Medication List        Accurate as of 09/16/17  9:40 PM. Always use your most recent med list.          ALLEGRA ALLERGY 180 MG tablet Generic drug:  fexofenadine Take 180 mg by mouth daily.   azithromycin 250 MG tablet Commonly known as:  ZITHROMAX Z-PAK As directed   fluticasone 50 MCG/ACT nasal spray Commonly known as:  FLONASE SPRAY 1 SPRAY INTO EACH NOSTRIL EVERY DAY   levothyroxine 150 MCG tablet Commonly known as:   SYNTHROID, LEVOTHROID 1 tablet 30 minutes before breakfast daily   loratadine 10 MG tablet Commonly known as:  CLARITIN Take 10 mg by mouth daily.   multivitamin capsule Take 1 capsule by mouth daily.   Testosterone 10 MG/ACT (2%) Gel Apply 2 pumps on each inner thigh after shower every morning then allow to dry before dressing       Review of Systems  He has had chronic insomnia        Examination:    There were no vitals taken for this visit.   Exam not indicated  Assessment:  HYPOGONADISM:   He has hypogonadotropic hypogonadism baseline  testosterone of 208 and has been symptomatic He is now taking Fortesta gel, 4 pumps daily The dose was increased because of his level being 139 on the last visit  He is still having skin irritation and not clear if his applying a skin moisturizing cream interferes with the absorption, he has not used this much lately Since he has not tried Axiron which has a different vehicle for the testosterone may have less irritation with this Currently his insurance is not paying for [place] and he does not want to start injections otherwise which may be an option   HYPOTHYROIDISM, to have follow-up on the next visit  PLAN:  He will try Axiron 1 pump on each arm and follow-up with testosterone level in about 6 weeks  Reather Littler 09/16/2017, 9:40 PM

## 2017-09-17 ENCOUNTER — Ambulatory Visit (INDEPENDENT_AMBULATORY_CARE_PROVIDER_SITE_OTHER): Payer: BLUE CROSS/BLUE SHIELD | Admitting: Endocrinology

## 2017-09-17 ENCOUNTER — Encounter: Payer: Self-pay | Admitting: Endocrinology

## 2017-09-17 VITALS — BP 108/70 | HR 50 | Ht 70.0 in | Wt 153.8 lb

## 2017-09-17 DIAGNOSIS — E230 Hypopituitarism: Secondary | ICD-10-CM

## 2017-09-17 MED ORDER — TESTOSTERONE 30 MG/ACT TD SOLN
TRANSDERMAL | 2 refills | Status: DC
Start: 2017-09-17 — End: 2018-01-31

## 2017-09-17 NOTE — Patient Instructions (Signed)
AXIRON new rx

## 2017-09-22 DIAGNOSIS — J3089 Other allergic rhinitis: Secondary | ICD-10-CM | POA: Diagnosis not present

## 2017-09-22 DIAGNOSIS — J301 Allergic rhinitis due to pollen: Secondary | ICD-10-CM | POA: Diagnosis not present

## 2017-09-23 DIAGNOSIS — H52203 Unspecified astigmatism, bilateral: Secondary | ICD-10-CM | POA: Diagnosis not present

## 2017-09-23 DIAGNOSIS — H5213 Myopia, bilateral: Secondary | ICD-10-CM | POA: Diagnosis not present

## 2017-09-23 DIAGNOSIS — H524 Presbyopia: Secondary | ICD-10-CM | POA: Diagnosis not present

## 2017-09-23 DIAGNOSIS — H10413 Chronic giant papillary conjunctivitis, bilateral: Secondary | ICD-10-CM | POA: Diagnosis not present

## 2017-09-29 DIAGNOSIS — J301 Allergic rhinitis due to pollen: Secondary | ICD-10-CM | POA: Diagnosis not present

## 2017-09-29 DIAGNOSIS — J3089 Other allergic rhinitis: Secondary | ICD-10-CM | POA: Diagnosis not present

## 2017-09-30 ENCOUNTER — Encounter

## 2017-10-06 DIAGNOSIS — J3089 Other allergic rhinitis: Secondary | ICD-10-CM | POA: Diagnosis not present

## 2017-10-06 DIAGNOSIS — J301 Allergic rhinitis due to pollen: Secondary | ICD-10-CM | POA: Diagnosis not present

## 2017-10-09 DIAGNOSIS — S86811A Strain of other muscle(s) and tendon(s) at lower leg level, right leg, initial encounter: Secondary | ICD-10-CM | POA: Diagnosis not present

## 2017-10-13 DIAGNOSIS — J301 Allergic rhinitis due to pollen: Secondary | ICD-10-CM | POA: Diagnosis not present

## 2017-10-13 DIAGNOSIS — J3089 Other allergic rhinitis: Secondary | ICD-10-CM | POA: Diagnosis not present

## 2017-10-14 ENCOUNTER — Telehealth: Payer: Self-pay | Admitting: Internal Medicine

## 2017-10-14 DIAGNOSIS — M79604 Pain in right leg: Secondary | ICD-10-CM | POA: Diagnosis not present

## 2017-10-14 NOTE — Telephone Encounter (Signed)
[facility] forwarded 2 pages to Dr.John Fayrene FearingJames

## 2017-10-15 ENCOUNTER — Telehealth: Payer: Self-pay | Admitting: Endocrinology

## 2017-10-15 NOTE — Telephone Encounter (Signed)
Called pharmacy and notified them that we do not have a fax stating that pt needs a PA. They state that they will refax.

## 2017-10-15 NOTE — Telephone Encounter (Signed)
Sharyl NimrodMeredith from [facility] is calling on the status of a PA for this patient [phone]

## 2017-10-20 DIAGNOSIS — J301 Allergic rhinitis due to pollen: Secondary | ICD-10-CM | POA: Diagnosis not present

## 2017-10-20 DIAGNOSIS — J3089 Other allergic rhinitis: Secondary | ICD-10-CM | POA: Diagnosis not present

## 2017-10-21 DIAGNOSIS — S86811D Strain of other muscle(s) and tendon(s) at lower leg level, right leg, subsequent encounter: Secondary | ICD-10-CM | POA: Diagnosis not present

## 2017-10-21 DIAGNOSIS — M6281 Muscle weakness (generalized): Secondary | ICD-10-CM | POA: Diagnosis not present

## 2017-10-21 DIAGNOSIS — M79661 Pain in right lower leg: Secondary | ICD-10-CM | POA: Diagnosis not present

## 2017-10-22 ENCOUNTER — Telehealth: Payer: Self-pay | Admitting: Endocrinology

## 2017-10-22 NOTE — Telephone Encounter (Signed)
Testosterone 30 MG/ACT SOLN    Patient is stating that [facility] is needing a PA sent in for this medication   Phone- [phone] FAX- [phone]

## 2017-10-22 NOTE — Telephone Encounter (Signed)
Called pt and left detailed voicemail explaining that PA was approved for 36 months.

## 2017-10-22 NOTE — Telephone Encounter (Signed)
Called [facility] to do PA. Number called was [phone]. Patient was approved for 36 months of therapy and Reference number is [id number]

## 2017-10-23 DIAGNOSIS — S86811D Strain of other muscle(s) and tendon(s) at lower leg level, right leg, subsequent encounter: Secondary | ICD-10-CM | POA: Diagnosis not present

## 2017-10-23 DIAGNOSIS — M79661 Pain in right lower leg: Secondary | ICD-10-CM | POA: Diagnosis not present

## 2017-10-23 DIAGNOSIS — M6281 Muscle weakness (generalized): Secondary | ICD-10-CM | POA: Diagnosis not present

## 2017-10-27 DIAGNOSIS — J3089 Other allergic rhinitis: Secondary | ICD-10-CM | POA: Diagnosis not present

## 2017-10-27 DIAGNOSIS — J301 Allergic rhinitis due to pollen: Secondary | ICD-10-CM | POA: Diagnosis not present

## 2017-10-28 DIAGNOSIS — M6281 Muscle weakness (generalized): Secondary | ICD-10-CM | POA: Diagnosis not present

## 2017-10-28 DIAGNOSIS — S86811D Strain of other muscle(s) and tendon(s) at lower leg level, right leg, subsequent encounter: Secondary | ICD-10-CM | POA: Diagnosis not present

## 2017-10-28 DIAGNOSIS — M79661 Pain in right lower leg: Secondary | ICD-10-CM | POA: Diagnosis not present

## 2017-10-29 ENCOUNTER — Other Ambulatory Visit: Payer: BLUE CROSS/BLUE SHIELD

## 2017-10-30 DIAGNOSIS — M79661 Pain in right lower leg: Secondary | ICD-10-CM | POA: Diagnosis not present

## 2017-10-30 DIAGNOSIS — M6281 Muscle weakness (generalized): Secondary | ICD-10-CM | POA: Diagnosis not present

## 2017-10-30 DIAGNOSIS — S86811D Strain of other muscle(s) and tendon(s) at lower leg level, right leg, subsequent encounter: Secondary | ICD-10-CM | POA: Diagnosis not present

## 2017-11-03 DIAGNOSIS — J301 Allergic rhinitis due to pollen: Secondary | ICD-10-CM | POA: Diagnosis not present

## 2017-11-03 DIAGNOSIS — J3089 Other allergic rhinitis: Secondary | ICD-10-CM | POA: Diagnosis not present

## 2017-11-04 DIAGNOSIS — M79661 Pain in right lower leg: Secondary | ICD-10-CM | POA: Diagnosis not present

## 2017-11-04 DIAGNOSIS — M6281 Muscle weakness (generalized): Secondary | ICD-10-CM | POA: Diagnosis not present

## 2017-11-04 DIAGNOSIS — S86811D Strain of other muscle(s) and tendon(s) at lower leg level, right leg, subsequent encounter: Secondary | ICD-10-CM | POA: Diagnosis not present

## 2017-11-06 DIAGNOSIS — M79661 Pain in right lower leg: Secondary | ICD-10-CM | POA: Diagnosis not present

## 2017-11-06 DIAGNOSIS — M6281 Muscle weakness (generalized): Secondary | ICD-10-CM | POA: Diagnosis not present

## 2017-11-06 DIAGNOSIS — S86811D Strain of other muscle(s) and tendon(s) at lower leg level, right leg, subsequent encounter: Secondary | ICD-10-CM | POA: Diagnosis not present

## 2017-11-10 ENCOUNTER — Other Ambulatory Visit: Payer: Self-pay | Admitting: Endocrinology

## 2017-11-10 DIAGNOSIS — J301 Allergic rhinitis due to pollen: Secondary | ICD-10-CM | POA: Diagnosis not present

## 2017-11-10 DIAGNOSIS — J3089 Other allergic rhinitis: Secondary | ICD-10-CM | POA: Diagnosis not present

## 2017-11-11 ENCOUNTER — Encounter: Payer: Self-pay | Admitting: Sports Medicine

## 2017-11-11 ENCOUNTER — Ambulatory Visit (INDEPENDENT_AMBULATORY_CARE_PROVIDER_SITE_OTHER): Payer: BLUE CROSS/BLUE SHIELD | Admitting: Sports Medicine

## 2017-11-11 VITALS — BP 106/70 | Ht 70.0 in | Wt 150.0 lb

## 2017-11-11 DIAGNOSIS — S86811D Strain of other muscle(s) and tendon(s) at lower leg level, right leg, subsequent encounter: Secondary | ICD-10-CM | POA: Diagnosis not present

## 2017-11-11 DIAGNOSIS — M6281 Muscle weakness (generalized): Secondary | ICD-10-CM | POA: Diagnosis not present

## 2017-11-11 DIAGNOSIS — S86111A Strain of other muscle(s) and tendon(s) of posterior muscle group at lower leg level, right leg, initial encounter: Secondary | ICD-10-CM | POA: Diagnosis not present

## 2017-11-11 DIAGNOSIS — M79661 Pain in right lower leg: Secondary | ICD-10-CM | POA: Diagnosis not present

## 2017-11-11 DIAGNOSIS — S86819A Strain of other muscle(s) and tendon(s) at lower leg level, unspecified leg, initial encounter: Secondary | ICD-10-CM

## 2017-11-11 MED ORDER — NITROGLYCERIN 0.2 MG/HR TD PT24: MEDICATED_PATCH | TRANSDERMAL | 1 refills | Status: DC

## 2017-11-11 NOTE — Patient Instructions (Signed)

## 2017-11-11 NOTE — Progress Notes (Signed)
   HPI  CC: Right calf pain  Mr. Joseph Santana is a 62 year old male who presents for follow-up of right calf partial tear.  He states that he was training for marathon around 1 month ago, and was starting to speed work.  He states that he was doing 400 m intervals, when he felt the pop.  He states he could not walk following this pop.  He states there is swelling in his right calf following this.  [facility] 1 week following the injury.  He was told that he had a partial tear of his right medial calf on MRI.  He was started in PT twice a week at that time.  He also begin doing crosstraining to not put strain on his calf.  He states he has done well with this regimen since that time.  He has not done any eccentric exercises.  He has tried NSAIDs off and on with some relief.  He is also had cupping performed during physical therapy.  He was able to tolerate a 3 and half mile run on Saturday of this week.  Denies any numbness or tingling.  He denies any weakness in the area.  See HPI and/or previous note for associated ROS.  ROS Still some pain after running No new swelling in calf Objective: BP 106/70   Ht 5\' 10"  (1.778 m)   Wt 150 lb (68 kg)   BMI 21.52 kg/m  Gen: NAD, well groomed, a/o x3, normal affect.  CV: Well-perfused. Warm.  Resp: Non-labored.  Neuro: Sensation intact throughout. No gross coordination deficits.  Gait: stride with out-turning of right foot, with slight signs of limp / no balance issues.  Right lower extremity exam: No erythema or swelling noted.  Bruising noted around the medial calf.  Pain to palpation over the mid body of the medial gastrocnemius muscle.  Full range of motion in flexion and extension of the knee.  Full range of motion dorsi and plantar flexion.  Strength 5 out of 5 throughout.  Negative Thompson's test.  Patient is neurovascular intact.   ULTRASOUND: Right Posterior leg    - Brief US performed, showing area of  hypo-echogenicity in the medial gastrocnemius, consistent with partial tear of medial gastrocnemius muscle, around 20% thickness, along the border of the soleus muscle at distal fascial insertion.  Assessment and plan: Partial tear of right gastrocnemius  Patient with ultrasound findings today consistent with healing partial tear of the right gastrocnemius muscle.  He will continue physical therapy at this time.  We have given him a list of eccentric exercises he can work on today to help strengthen the gastrocnemius, and Achilles tendon.  Will start patient on Nitropatch protocol at this time.   We have advised him to apply 1/4 nitroglycerin patch to affected area daily.  We also provide patient with heel lifts for both shoes at today's visit.  Patient to return to clinic in 4 weeks for follow-up.  Add heel lifts to running shoes  Joseph QuanBlake Dixon, MD [facility] Fellow 11/11/2017 1:24 PM   I observed and examined the patient with the [facility] and agree with assessment and plan.  Note reviewed and modified by me. Joseph BigKB Yehoshua Vitelli, MD

## 2017-11-11 NOTE — Assessment & Plan Note (Signed)
[place] exercises NTG protocol Needs to XT at least a couple more weeks and then ease back into training  Repeat US in 4 to 6 weeks

## 2017-11-13 DIAGNOSIS — M6281 Muscle weakness (generalized): Secondary | ICD-10-CM | POA: Diagnosis not present

## 2017-11-13 DIAGNOSIS — S86811D Strain of other muscle(s) and tendon(s) at lower leg level, right leg, subsequent encounter: Secondary | ICD-10-CM | POA: Diagnosis not present

## 2017-11-13 DIAGNOSIS — M79661 Pain in right lower leg: Secondary | ICD-10-CM | POA: Diagnosis not present

## 2017-11-21 ENCOUNTER — Ambulatory Visit (INDEPENDENT_AMBULATORY_CARE_PROVIDER_SITE_OTHER): Payer: BLUE CROSS/BLUE SHIELD | Admitting: Family

## 2017-11-21 ENCOUNTER — Encounter: Payer: Self-pay | Admitting: Family

## 2017-11-21 VITALS — BP 112/76 | HR 75 | Temp 98.1°F | Ht 70.0 in | Wt 149.0 lb

## 2017-11-21 DIAGNOSIS — J019 Acute sinusitis, unspecified: Secondary | ICD-10-CM | POA: Diagnosis not present

## 2017-11-21 MED ORDER — PREDNISONE 10 MG (21) PO TBPK: ORAL_TABLET | ORAL | 0 refills | Status: DC

## 2017-11-21 MED ORDER — DOXYCYCLINE HYCLATE 100 MG PO TABS: 100.0000 mg | ORAL_TABLET | Freq: Two times a day (BID) | ORAL | 0 refills | Status: DC

## 2017-11-21 NOTE — Progress Notes (Signed)
Joseph Santana is a 62 y.o. male with the following history as recorded in EpicCare:  Patient Active Problem List   Diagnosis Date Noted  . Strain of calf muscle, initial encounter 11/11/2017  . Left leg pain 07/10/2017  . Fatigue 06/03/2017  . Viral URI with cough 05/07/2017  . Flu-like symptoms 05/07/2017  . Acute pharyngitis 08/07/2016  . Skin lesion 10/18/2015  . Abnormality of gait 06/08/2015  . Routine general medical examination at a health care facility 03/18/2014  . Acute recurrent maxillary sinusitis 07/22/2009  . Hypogonadism in male 06/26/2009  . Sciatica associated with disorder of lumbar spine 12/31/2006  . Hypothyroidism 10/08/2006  . Borderline hyperlipidemia 10/08/2006    Current Outpatient Medications  Medication Sig Dispense Refill  . EPINEPHrine (EPIPEN 2-PAK) 0.3 mg/0.3 mL IJ SOAJ injection EpiPen 2-Pak 0.3 mg/0.3 mL injection, auto-injector    . fluticasone (FLONASE) 50 MCG/ACT nasal spray SPRAY 1 SPRAY INTO EACH NOSTRIL EVERY DAY  5  . levothyroxine (SYNTHROID, LEVOTHROID) 150 MCG tablet 1 TABLET 30 MINUTES BEFORE BREAKFAST DAILY 90 tablet 1  . levothyroxine (SYNTHROID, LEVOTHROID) 150 MCG tablet levothyroxine 150 mcg tablet    . loratadine (CLARITIN) 10 MG tablet Take 10 mg by mouth daily.    . Multiple Vitamin (MULTIVITAMIN) capsule Take 1 capsule by mouth daily.      . nitroGLYCERIN (NITRODUR - DOSED IN MG/24 HR) 0.2 mg/hr patch Place 1/4 patch to the affected area daily. 30 patch 1  . Olopatadine HCl 0.6 % SOLN     . Testosterone 30 MG/ACT SOLN Use 1 application under each arm daily 90 mL 2  . doxycycline (VIBRA-TABS) 100 MG tablet Take 1 tablet (100 mg total) by mouth 2 (two) times daily. 20 tablet 0  . predniSONE (STERAPRED UNI-PAK 21 TAB) 10 MG (21) TBPK tablet Taper as directed 21 tablet 0   No current facility-administered medications for this visit.     Allergies: Iodine and Levofloxacin  Past Medical History:  Diagnosis Date  . ALLERGIC  REACTION 07/22/2009  . ALLERGIC RHINITIS 10/08/2006  . B12 DEFICIENCY 05/14/2010  . Carpal tunnel syndrome 12/31/2007  . Degeneration of cervical intervertebral disc 12/31/2007  . HYPERLIPIDEMIA 10/08/2006  . HYPOTHYROIDISM 10/08/2006  . Irritable bowel syndrome 03/03/2008  . LUMBAR RADICULOPATHY, RIGHT 12/31/2006  . OSTEOARTHRITIS, LOWER LEG, LEFT 05/10/2009  . SINUSITIS - ACUTE-NOS 07/22/2009  . TESTICULAR HYPOFUNCTION 06/26/2009    Past Surgical History:  Procedure Laterality Date  . KNEE SURGERY    . LUMBAR LAMINECTOMY  2008   with revision  . SPINAL FUSION  2011  . spinal fusion revision  2012    Family History  Problem Relation Age of Onset  . Hypothyroidism Father   . Asthma Unknown   . Diabetes Unknown   . Allergy (severe) Unknown   . Hypothyroidism Mother   . Hypothyroidism Sister   . Hypothyroidism Brother     Social History   Tobacco Use  . Smoking status: Never Smoker  . Smokeless tobacco: Never Used  Substance Use Topics  . Alcohol use: No    Subjective:  Patient presents with concerns for possible sinus infection; + sinus pain/ pressure; + cough- keeping awake at night; + using OTC Sudafed and Mucinex;  Prone to recurrent sinus infections; takes immunotherapy;  Objective:  Vitals:   11/21/17 1539  BP: 112/76  Pulse: 75  Temp: 98.1 F (36.7 C)  TempSrc: Oral  SpO2: 97%  Weight: 149 lb (67.6 kg)  Height: 5\' 10"  (1.778  m)    General: Well developed, well nourished, in no acute distress  Skin : Warm and dry.  Head: Normocephalic and atraumatic  Eyes: Sclera and conjunctiva clear; pupils round and reactive to light; extraocular movements intact  Ears: External normal; canals clear; tympanic membranes congested bilaterally Oropharynx: Pink, supple. No suspicious lesions  Neck: Supple without thyromegaly, adenopathy  Lungs: Respirations unlabored; clear to auscultation bilaterally without wheeze, rales, rhonchi  CVS exam: normal rate and regular rhythm.   Neurologic: Alert and oriented; speech intact; face symmetrical; moves all extremities well; CNII-XII intact without focal deficit   Assessment:  1. Acute sinusitis, recurrence not specified, unspecified location     Plan:  Rx for Doxcycyline 100 mg bid x 10 days; Rx for Prednisone taper pak; increase fluids, rest and follow-up worse, no better.   No follow-ups on file.  No orders of the defined types were placed in this encounter.   Requested Prescriptions   Signed Prescriptions Disp Refills  . doxycycline (VIBRA-TABS) 100 MG tablet 20 tablet 0    Sig: Take 1 tablet (100 mg total) by mouth 2 (two) times daily.  . predniSONE (STERAPRED UNI-PAK 21 TAB) 10 MG (21) TBPK tablet 21 tablet 0    Sig: Taper as directed

## 2017-11-24 DIAGNOSIS — J3089 Other allergic rhinitis: Secondary | ICD-10-CM | POA: Diagnosis not present

## 2017-11-24 DIAGNOSIS — J301 Allergic rhinitis due to pollen: Secondary | ICD-10-CM | POA: Diagnosis not present

## 2017-11-26 ENCOUNTER — Other Ambulatory Visit (INDEPENDENT_AMBULATORY_CARE_PROVIDER_SITE_OTHER): Payer: BLUE CROSS/BLUE SHIELD

## 2017-11-26 DIAGNOSIS — E230 Hypopituitarism: Secondary | ICD-10-CM

## 2017-11-26 LAB — TESTOSTERONE: Testosterone: 370.72 ng/dL (ref 300.00–890.00)

## 2017-11-27 NOTE — Progress Notes (Signed)
Please call to let patient know that the testosterone results are normal and no change needed

## 2017-12-01 ENCOUNTER — Encounter: Payer: Self-pay | Admitting: Family

## 2017-12-01 DIAGNOSIS — J301 Allergic rhinitis due to pollen: Secondary | ICD-10-CM | POA: Diagnosis not present

## 2017-12-01 DIAGNOSIS — J3089 Other allergic rhinitis: Secondary | ICD-10-CM | POA: Diagnosis not present

## 2017-12-01 NOTE — Telephone Encounter (Signed)
Vernona RiegerLaura is out of the office this week. Forwarding email to PCP for advisement on [email or web address]

## 2017-12-02 ENCOUNTER — Telehealth: Payer: Self-pay | Admitting: Endocrinology

## 2017-12-02 NOTE — Telephone Encounter (Signed)
Patient returned Sierra's call. Please call patient at ph# [phone]

## 2017-12-02 NOTE — Telephone Encounter (Signed)
Spoke with patient informing him of results.

## 2017-12-08 DIAGNOSIS — J301 Allergic rhinitis due to pollen: Secondary | ICD-10-CM | POA: Diagnosis not present

## 2017-12-08 DIAGNOSIS — J3089 Other allergic rhinitis: Secondary | ICD-10-CM | POA: Diagnosis not present

## 2017-12-16 DIAGNOSIS — J3089 Other allergic rhinitis: Secondary | ICD-10-CM | POA: Diagnosis not present

## 2017-12-16 DIAGNOSIS — J301 Allergic rhinitis due to pollen: Secondary | ICD-10-CM | POA: Diagnosis not present

## 2017-12-18 DIAGNOSIS — J301 Allergic rhinitis due to pollen: Secondary | ICD-10-CM | POA: Diagnosis not present

## 2017-12-18 DIAGNOSIS — J3089 Other allergic rhinitis: Secondary | ICD-10-CM | POA: Diagnosis not present

## 2017-12-18 DIAGNOSIS — T63441A Toxic effect of venom of bees, accidental (unintentional), initial encounter: Secondary | ICD-10-CM | POA: Diagnosis not present

## 2017-12-18 DIAGNOSIS — Z91013 Allergy to seafood: Secondary | ICD-10-CM | POA: Diagnosis not present

## 2017-12-19 DIAGNOSIS — J301 Allergic rhinitis due to pollen: Secondary | ICD-10-CM | POA: Diagnosis not present

## 2017-12-22 DIAGNOSIS — J3089 Other allergic rhinitis: Secondary | ICD-10-CM | POA: Diagnosis not present

## 2017-12-22 DIAGNOSIS — J301 Allergic rhinitis due to pollen: Secondary | ICD-10-CM | POA: Diagnosis not present

## 2017-12-23 ENCOUNTER — Ambulatory Visit (INDEPENDENT_AMBULATORY_CARE_PROVIDER_SITE_OTHER): Payer: BLUE CROSS/BLUE SHIELD | Admitting: Sports Medicine

## 2017-12-23 VITALS — BP 108/70 | Ht 70.0 in | Wt 145.0 lb

## 2017-12-23 DIAGNOSIS — S86111D Strain of other muscle(s) and tendon(s) of posterior muscle group at lower leg level, right leg, subsequent encounter: Secondary | ICD-10-CM

## 2017-12-23 NOTE — Progress Notes (Addendum)
   HPI  CC: Right calf pain  Joseph Santana is a 62 year old male who presents for follow-up of right calf pain.  At his last visit he is found to have a partial tear of his medial gastroc on the right side.  He states since that time the calf pain has mostly resolved.  He was able to run 30 miles last week without any issue.  He has not had any limp following the runs.  He has been using the Nitropatch as prescribed.  He is been doing eccentric calf exercises daily.  He states that his issue is mostly resolved.  He does have some residual pain following runs, but is not limiting him.  He is applying compression post run.  See HPI and/or previous note for associated ROS.  Objective: BP 108/70   Ht 5\' 10"  (1.778 m)   Wt 145 lb (65.8 kg)   BMI 20.81 kg/m  Gen: NAD, well groomed, a/o x3, normal affect.  CV: Well-perfused. Warm.  Resp: Non-labored.  Neuro: Sensation intact throughout. No gross coordination deficits.  Gait:unremarkable stride without signs of limp or balance issues.  Right calf exam: No swelling, erythema, warmth noted.  Mild tenderness to palpation along the medial aspect of the gastroc.  Full range of motion throughout lower extremity testing.  Strength 5 out of 5 throughout all testing.  No pain with dorsiflexion of the ankle.  Refill ultrasound of right posterior leg: Some hypoechoic changes in the medial gastroc along the border the soleus muscle at the distal fascial insertion, with no obvious tear of the muscle noted.  Assessment and plan: Partial tear of right gastrocnemius  Kent seems to be improved on today's exam.  He has been able to tolerate 30 miles a week of running.  He has not had a limp or any setback following this.  He can continue with home eccentric exercises.  Continue with the Nitropatch protocol.  He can continue to train as current rate, slowly increasing for his training for marathon in October.  We will see him back as needed.   Alric Quan, MD [facility]  Health Sports Medicine Fellow 12/23/2017 11:51 AM  I observed and examined the patient with the [facility] Fellow and agree with assessment and plan.  Note reviewed and modified by me. Sterling Big, MD

## 2017-12-29 DIAGNOSIS — J3089 Other allergic rhinitis: Secondary | ICD-10-CM | POA: Diagnosis not present

## 2017-12-29 DIAGNOSIS — J301 Allergic rhinitis due to pollen: Secondary | ICD-10-CM | POA: Diagnosis not present

## 2018-01-05 ENCOUNTER — Other Ambulatory Visit: Payer: Self-pay | Admitting: Endocrinology

## 2018-01-05 DIAGNOSIS — E230 Hypopituitarism: Secondary | ICD-10-CM

## 2018-01-05 DIAGNOSIS — E039 Hypothyroidism, unspecified: Secondary | ICD-10-CM

## 2018-01-05 DIAGNOSIS — J301 Allergic rhinitis due to pollen: Secondary | ICD-10-CM | POA: Diagnosis not present

## 2018-01-05 DIAGNOSIS — J3089 Other allergic rhinitis: Secondary | ICD-10-CM | POA: Diagnosis not present

## 2018-01-06 ENCOUNTER — Other Ambulatory Visit: Payer: Self-pay | Admitting: *Deleted

## 2018-01-06 MED ORDER — NITROGLYCERIN 0.2 MG/HR TD PT24: MEDICATED_PATCH | TRANSDERMAL | 1 refills | Status: DC

## 2018-01-07 ENCOUNTER — Other Ambulatory Visit (INDEPENDENT_AMBULATORY_CARE_PROVIDER_SITE_OTHER): Payer: BLUE CROSS/BLUE SHIELD

## 2018-01-07 DIAGNOSIS — E230 Hypopituitarism: Secondary | ICD-10-CM | POA: Diagnosis not present

## 2018-01-07 DIAGNOSIS — E039 Hypothyroidism, unspecified: Secondary | ICD-10-CM

## 2018-01-07 LAB — TSH: TSH: 0.47 u[IU]/mL (ref 0.35–4.50)

## 2018-01-07 LAB — CBC
HEMATOCRIT: 39.9 % (ref 39.0–52.0)
HEMOGLOBIN: 13.6 g/dL (ref 13.0–17.0)
MCHC: 34.1 g/dL (ref 30.0–36.0)
MCV: 87 fl (ref 78.0–100.0)
Platelets: 262 10*3/uL (ref 150.0–400.0)
RBC: 4.58 Mil/uL (ref 4.22–5.81)
RDW: 14 % (ref 11.5–15.5)
WBC: 7 10*3/uL (ref 4.0–10.5)

## 2018-01-07 LAB — T4, FREE: Free T4: 1.28 ng/dL (ref 0.60–1.60)

## 2018-01-07 LAB — TESTOSTERONE: Testosterone: 622.09 ng/dL (ref 300.00–890.00)

## 2018-01-12 DIAGNOSIS — J3089 Other allergic rhinitis: Secondary | ICD-10-CM | POA: Diagnosis not present

## 2018-01-12 DIAGNOSIS — J301 Allergic rhinitis due to pollen: Secondary | ICD-10-CM | POA: Diagnosis not present

## 2018-01-13 NOTE — Progress Notes (Signed)
Patient ID: Joseph Santana, male   DOB: [birth year], 62 y.o.   MRN: [id number]            Reason for Appointment:  Follow-up of low testosterone     History of Present Illness:   HYPOGONADISM: Prior history: He was diagnosed to have a low testosterone level in 2012 or so when he was coming to his physician with complaints of increased fatigue.  Not clear what his baseline testosterone level was but he had a level of about 130 in 2013 around the time when he was having back surgery No record of detail evaluation with pituitary function available He thinks he was tried on AndroGel initially but because of skin irritation he was changed to testosterone injections.  He may have been prescribed Axiron also but does not remember this He thinks he had significantly better with taking testosterone injections, apparently was taking about 100 mg every 2 weeks He stopped taking the injections 6-8 months prior to his initial consultation  as they were not renewed and he had a change in his PCP  RECENT history: On his initial consultation he was complaining of fatigue  Baseline testosterone was 208 with LH of 2.1 and normal prolactin, free testosterone not done by the lab  He had been on a trial of clomiphene half tablet, initially 3 times a week since 01/11/16 With this he had more energy after starting treatment With this his testosterone level came back to normal until 2/18 when it was 494, subsequently had been progressively declining and was 273 done in 1/19 This was despite taking 50 mg 5 days a week; with this he was complaining of more fatigue and sluggishness and decreased motivation   Also complaining of decreased libido  RECENT history: He was subsequently taking Fortesta gel but this was changed because of redness, irritation and sunburn-like reaction on the skin Because of the skin irritation he was changed from 1 pump on the thigh on each side to the Axiron preparation  Initially his  testosterone level was 370 With this his subjective symptoms of fatigue and decreased libido were controlled and he is still feeling fairly good  Reviewing of his technique of applying the Axiron show that he is dabbing it in the upper part of the axillae and not swiping from low to high position and he is tending to lose about a third of his solution However surprisingly his level is up to 622  Currently his hemoglobin and also last PSA have been quite normal and he has not had any symptoms of prostatism  He is not very keen on using testosterone injections  He has had the following testosterone levels   Lab Results  Component Value Date   TESTOSTERONE 622.09 01/07/2018   TESTOSTERONE 370.72 11/26/2017   TESTOSTERONE >1620.00 (H) 09/16/2017   TESTOSTERONE 139.98 (L) 07/23/2017    Lab Results  Component Value Date   LH 3.39 03/03/2017   LH 2.94 02/28/2016   LH 2.10 01/10/2016      Hypothyroidism was first diagnosed  about 30 years ago  At the time of diagnosis patient was having symptoms of  fatigue but he is not sure about all the other symptoms Most probably tested also because of strong family history of thyroid disease  The patient has been treated with  levothyroxine in various doses He feels fairly good with his energy level His weight appears to be the same this year   He takes his thyroid supplement  consistently in the morning  TSH level is consistently normal with 150 mcg daily         Patient's weight history is as follows:  Wt Readings from Last 3 Encounters:  01/14/18 150 lb (68 kg)  12/23/17 145 lb (65.8 kg)  11/21/17 149 lb (67.6 kg)    Thyroid function results have been as follows:  Lab Results  Component Value Date   TSH 0.47 01/07/2018   TSH 1.54 07/23/2017   TSH 1.27 12/02/2016   FREET4 1.28 01/07/2018   FREET4 1.21 12/02/2016   FREET4 1.28 01/10/2016      Past Medical History:  Diagnosis Date  . ALLERGIC REACTION 07/22/2009  .  ALLERGIC RHINITIS 10/08/2006  . B12 DEFICIENCY 05/14/2010  . Carpal tunnel syndrome 12/31/2007  . Degeneration of cervical intervertebral disc 12/31/2007  . HYPERLIPIDEMIA 10/08/2006  . HYPOTHYROIDISM 10/08/2006  . Irritable bowel syndrome 03/03/2008  . LUMBAR RADICULOPATHY, RIGHT 12/31/2006  . OSTEOARTHRITIS, LOWER LEG, LEFT 05/10/2009  . SINUSITIS - ACUTE-NOS 07/22/2009  . TESTICULAR HYPOFUNCTION 06/26/2009    Past Surgical History:  Procedure Laterality Date  . KNEE SURGERY    . LUMBAR LAMINECTOMY  2008   with revision  . SPINAL FUSION  2011  . spinal fusion revision  2012    Family History  Problem Relation Age of Onset  . Hypothyroidism Father   . Asthma Unknown   . Diabetes Unknown   . Allergy (severe) Unknown   . Hypothyroidism Mother   . Hypothyroidism Sister   . Hypothyroidism Brother     Social History:  reports that he has never smoked. He has never used smokeless tobacco. He reports that he does not drink alcohol or use drugs.  Allergies:  Allergies  Allergen Reactions  . Iodine   . Levofloxacin     Allergies as of 01/14/2018      Reactions   Iodine    Levofloxacin       Medication List        Accurate as of 01/14/18  8:40 AM. Always use your most recent med list.          doxycycline 100 MG tablet Commonly known as:  VIBRA-TABS Take 1 tablet (100 mg total) by mouth 2 (two) times daily.   EPIPEN 2-PAK 0.3 mg/0.3 mL Soaj injection Generic drug:  EPINEPHrine EpiPen 2-Pak 0.3 mg/0.3 mL injection, auto-injector   fluticasone 50 MCG/ACT nasal spray Commonly known as:  FLONASE SPRAY 1 SPRAY INTO EACH NOSTRIL EVERY DAY   levothyroxine 150 MCG tablet Commonly known as:  SYNTHROID, LEVOTHROID levothyroxine 150 mcg tablet   loratadine 10 MG tablet Commonly known as:  CLARITIN Take 10 mg by mouth daily.   multivitamin capsule Take 1 capsule by mouth daily.   nitroGLYCERIN 0.2 mg/hr patch Commonly known as:  NITRODUR - Dosed in mg/24 hr Place 1/4  patch to the affected area daily.   Olopatadine HCl 0.6 % Soln   Testosterone 30 MG/ACT Soln Use 1 application under each arm daily       Review of Systems  He has had chronic insomnia        Examination:    BP 118/64   Pulse 64   Ht 5\' 10"  (1.778 m)   Wt 150 lb (68 kg)   SpO2 96%   BMI 21.52 kg/m    Exam not indicated  Assessment:  HYPOGONADISM:   He has hypogonadotropic hypogonadism with baseline testosterone of 208 and has been symptomatic He is now taking  Axiron instead of Fortesta gel, 2 pumps daily Also had some issues with skin irritation and inconsistent levels with [place] Although he is having no skin reaction with Axiron he is apparently not using the proper technique and sometimes losing some of his solution Surprisingly his level is again variable and higher recently  No adverse effects of long-term testosterone supplementation seen   HYPOTHYROIDISM, TSH normal with 150 mcg and he will continue   PLAN:  Discussed proper technique for applying the Axiron He will also watch the video online Although it may be reasonable to reduce the dose to 1 pump only he wants to wait a couple of months to recheck the level before changing  Otherwise follow-up in 6 months and periodically still continue to monitor CBC Influenza vaccine given  Reather Littler 01/14/2018, 8:40 AM

## 2018-01-14 ENCOUNTER — Encounter: Payer: Self-pay | Admitting: Endocrinology

## 2018-01-14 ENCOUNTER — Ambulatory Visit (INDEPENDENT_AMBULATORY_CARE_PROVIDER_SITE_OTHER): Payer: BLUE CROSS/BLUE SHIELD | Admitting: Endocrinology

## 2018-01-14 VITALS — BP 118/64 | HR 64 | Ht 70.0 in | Wt 150.0 lb

## 2018-01-14 DIAGNOSIS — Z23 Encounter for immunization: Secondary | ICD-10-CM | POA: Diagnosis not present

## 2018-01-14 DIAGNOSIS — E039 Hypothyroidism, unspecified: Secondary | ICD-10-CM

## 2018-01-14 DIAGNOSIS — E230 Hypopituitarism: Secondary | ICD-10-CM

## 2018-01-19 DIAGNOSIS — J3089 Other allergic rhinitis: Secondary | ICD-10-CM | POA: Diagnosis not present

## 2018-01-19 DIAGNOSIS — J301 Allergic rhinitis due to pollen: Secondary | ICD-10-CM | POA: Diagnosis not present

## 2018-01-28 DIAGNOSIS — J301 Allergic rhinitis due to pollen: Secondary | ICD-10-CM | POA: Diagnosis not present

## 2018-01-28 DIAGNOSIS — J3089 Other allergic rhinitis: Secondary | ICD-10-CM | POA: Diagnosis not present

## 2018-01-31 ENCOUNTER — Other Ambulatory Visit: Payer: Self-pay | Admitting: Endocrinology

## 2018-02-02 DIAGNOSIS — J301 Allergic rhinitis due to pollen: Secondary | ICD-10-CM | POA: Diagnosis not present

## 2018-02-02 DIAGNOSIS — J3089 Other allergic rhinitis: Secondary | ICD-10-CM | POA: Diagnosis not present

## 2018-02-02 NOTE — Telephone Encounter (Signed)
Rx Testoterone 30 mg/1.5 pump requesting for refill. Last seen 01/14/18 lasy refilled-09/17/17 Sent to Dr. Everardo All

## 2018-02-09 DIAGNOSIS — J301 Allergic rhinitis due to pollen: Secondary | ICD-10-CM | POA: Diagnosis not present

## 2018-02-09 DIAGNOSIS — J3089 Other allergic rhinitis: Secondary | ICD-10-CM | POA: Diagnosis not present

## 2018-02-19 DIAGNOSIS — J301 Allergic rhinitis due to pollen: Secondary | ICD-10-CM | POA: Diagnosis not present

## 2018-02-19 DIAGNOSIS — J3089 Other allergic rhinitis: Secondary | ICD-10-CM | POA: Diagnosis not present

## 2018-02-26 DIAGNOSIS — J301 Allergic rhinitis due to pollen: Secondary | ICD-10-CM | POA: Diagnosis not present

## 2018-02-26 DIAGNOSIS — J3089 Other allergic rhinitis: Secondary | ICD-10-CM | POA: Diagnosis not present

## 2018-03-03 DIAGNOSIS — J301 Allergic rhinitis due to pollen: Secondary | ICD-10-CM | POA: Diagnosis not present

## 2018-03-03 DIAGNOSIS — J3089 Other allergic rhinitis: Secondary | ICD-10-CM | POA: Diagnosis not present

## 2018-03-09 DIAGNOSIS — J3089 Other allergic rhinitis: Secondary | ICD-10-CM | POA: Diagnosis not present

## 2018-03-09 DIAGNOSIS — J301 Allergic rhinitis due to pollen: Secondary | ICD-10-CM | POA: Diagnosis not present

## 2018-03-17 DIAGNOSIS — J301 Allergic rhinitis due to pollen: Secondary | ICD-10-CM | POA: Diagnosis not present

## 2018-03-17 DIAGNOSIS — J3089 Other allergic rhinitis: Secondary | ICD-10-CM | POA: Diagnosis not present

## 2018-03-18 ENCOUNTER — Other Ambulatory Visit (INDEPENDENT_AMBULATORY_CARE_PROVIDER_SITE_OTHER): Payer: BLUE CROSS/BLUE SHIELD

## 2018-03-18 DIAGNOSIS — E230 Hypopituitarism: Secondary | ICD-10-CM

## 2018-03-18 DIAGNOSIS — E039 Hypothyroidism, unspecified: Secondary | ICD-10-CM

## 2018-03-18 LAB — TSH: TSH: 1.02 u[IU]/mL (ref 0.35–4.50)

## 2018-03-18 LAB — TESTOSTERONE: Testosterone: 367.12 ng/dL (ref 300.00–890.00)

## 2018-03-25 DIAGNOSIS — J301 Allergic rhinitis due to pollen: Secondary | ICD-10-CM | POA: Diagnosis not present

## 2018-03-25 DIAGNOSIS — J3089 Other allergic rhinitis: Secondary | ICD-10-CM | POA: Diagnosis not present

## 2018-03-30 DIAGNOSIS — J3089 Other allergic rhinitis: Secondary | ICD-10-CM | POA: Diagnosis not present

## 2018-03-30 DIAGNOSIS — J301 Allergic rhinitis due to pollen: Secondary | ICD-10-CM | POA: Diagnosis not present

## 2018-04-06 DIAGNOSIS — J3089 Other allergic rhinitis: Secondary | ICD-10-CM | POA: Diagnosis not present

## 2018-04-06 DIAGNOSIS — J301 Allergic rhinitis due to pollen: Secondary | ICD-10-CM | POA: Diagnosis not present

## 2018-04-16 DIAGNOSIS — J301 Allergic rhinitis due to pollen: Secondary | ICD-10-CM | POA: Diagnosis not present

## 2018-04-16 DIAGNOSIS — J3089 Other allergic rhinitis: Secondary | ICD-10-CM | POA: Diagnosis not present

## 2018-04-20 DIAGNOSIS — J3089 Other allergic rhinitis: Secondary | ICD-10-CM | POA: Diagnosis not present

## 2018-04-20 DIAGNOSIS — J301 Allergic rhinitis due to pollen: Secondary | ICD-10-CM | POA: Diagnosis not present

## 2018-04-28 DIAGNOSIS — J3089 Other allergic rhinitis: Secondary | ICD-10-CM | POA: Diagnosis not present

## 2018-04-28 DIAGNOSIS — J301 Allergic rhinitis due to pollen: Secondary | ICD-10-CM | POA: Diagnosis not present

## 2018-05-03 ENCOUNTER — Other Ambulatory Visit: Payer: Self-pay | Admitting: Endocrinology

## 2018-05-05 DIAGNOSIS — J301 Allergic rhinitis due to pollen: Secondary | ICD-10-CM | POA: Diagnosis not present

## 2018-05-05 DIAGNOSIS — J3089 Other allergic rhinitis: Secondary | ICD-10-CM | POA: Diagnosis not present

## 2018-05-11 DIAGNOSIS — J301 Allergic rhinitis due to pollen: Secondary | ICD-10-CM | POA: Diagnosis not present

## 2018-05-11 DIAGNOSIS — J3089 Other allergic rhinitis: Secondary | ICD-10-CM | POA: Diagnosis not present

## 2018-05-15 DIAGNOSIS — J301 Allergic rhinitis due to pollen: Secondary | ICD-10-CM | POA: Diagnosis not present

## 2018-05-18 DIAGNOSIS — J301 Allergic rhinitis due to pollen: Secondary | ICD-10-CM | POA: Diagnosis not present

## 2018-05-18 DIAGNOSIS — J3089 Other allergic rhinitis: Secondary | ICD-10-CM | POA: Diagnosis not present

## 2018-05-22 ENCOUNTER — Other Ambulatory Visit: Payer: Self-pay | Admitting: Endocrinology

## 2018-05-25 DIAGNOSIS — J3089 Other allergic rhinitis: Secondary | ICD-10-CM | POA: Diagnosis not present

## 2018-05-25 DIAGNOSIS — J301 Allergic rhinitis due to pollen: Secondary | ICD-10-CM | POA: Diagnosis not present

## 2018-05-26 ENCOUNTER — Other Ambulatory Visit: Payer: Self-pay | Admitting: Endocrinology

## 2018-05-26 ENCOUNTER — Telehealth: Payer: Self-pay

## 2018-05-26 MED ORDER — TESTOSTERONE 30 MG/ACT TD SOLN: TRANSDERMAL | 2 refills | Status: DC

## 2018-05-26 NOTE — Telephone Encounter (Signed)
Could you please send the pt's testosterone again to the same pharmacy listed on his chart? The pharmacy insists that they did not receive this escribe. Pt called earlier today and stated that the pharmacy told him that this office placed the rx on hold.kum

## 2018-05-26 NOTE — Telephone Encounter (Signed)
Error

## 2018-05-27 DIAGNOSIS — M25551 Pain in right hip: Secondary | ICD-10-CM | POA: Diagnosis not present

## 2018-05-29 DIAGNOSIS — M79651 Pain in right thigh: Secondary | ICD-10-CM | POA: Diagnosis not present

## 2018-05-29 DIAGNOSIS — M25551 Pain in right hip: Secondary | ICD-10-CM | POA: Diagnosis not present

## 2018-06-03 DIAGNOSIS — J3089 Other allergic rhinitis: Secondary | ICD-10-CM | POA: Diagnosis not present

## 2018-06-03 DIAGNOSIS — J301 Allergic rhinitis due to pollen: Secondary | ICD-10-CM | POA: Diagnosis not present

## 2018-06-03 DIAGNOSIS — M1611 Unilateral primary osteoarthritis, right hip: Secondary | ICD-10-CM | POA: Diagnosis not present

## 2018-06-04 ENCOUNTER — Ambulatory Visit (INDEPENDENT_AMBULATORY_CARE_PROVIDER_SITE_OTHER): Payer: BLUE CROSS/BLUE SHIELD | Admitting: Internal Medicine

## 2018-06-04 ENCOUNTER — Encounter: Payer: Self-pay | Admitting: Internal Medicine

## 2018-06-04 VITALS — BP 116/82 | HR 71 | Temp 98.1°F | Ht 70.0 in | Wt 151.0 lb

## 2018-06-04 DIAGNOSIS — Z0000 Encounter for general adult medical examination without abnormal findings: Secondary | ICD-10-CM

## 2018-06-04 DIAGNOSIS — R5383 Other fatigue: Secondary | ICD-10-CM | POA: Diagnosis not present

## 2018-06-04 DIAGNOSIS — J309 Allergic rhinitis, unspecified: Secondary | ICD-10-CM | POA: Diagnosis not present

## 2018-06-04 MED ORDER — PREDNISONE 10 MG PO TABS: ORAL_TABLET | ORAL | 0 refills | Status: DC

## 2018-06-04 NOTE — Assessment & Plan Note (Signed)

## 2018-06-04 NOTE — Progress Notes (Signed)
Subjective:    Patient ID: Joseph Santana, male    DOB: [birth year], 63 y.o.   MRN: [id number]  HPI  Here for wellness and f/u;  Overall doing ok;  Pt denies Chest pain, worsening SOB, DOE, wheezing, orthopnea, PND, worsening LE edema, palpitations, dizziness or syncope.  Pt denies neurological change such as new headache, facial or extremity weakness.  Pt denies polydipsia, polyuria, or low sugar symptoms. Pt states overall good compliance with treatment and medications, good tolerability, and has been trying to follow appropriate diet.  Pt denies worsening depressive symptoms, suicidal ideation or panic. No fever, night sweats, wt loss, loss of appetite, or other constitutional symptoms.  Pt states good ability with ADL's, has low fall risk, home safety reviewed and adequate, no other significant changes in hearing or vision, and only occasionally active with exercise.   Does have several wks ongoing nasal allergy symptoms with clearish congestion, itch and sneezing, without fever, pain, ST, cough, swelling or wheezing. Past Medical History:  Diagnosis Date  . ALLERGIC REACTION 07/22/2009  . ALLERGIC RHINITIS 10/08/2006  . B12 DEFICIENCY 05/14/2010  . Carpal tunnel syndrome 12/31/2007  . Degeneration of cervical intervertebral disc 12/31/2007  . HYPERLIPIDEMIA 10/08/2006  . HYPOTHYROIDISM 10/08/2006  . Irritable bowel syndrome 03/03/2008  . LUMBAR RADICULOPATHY, RIGHT 12/31/2006  . OSTEOARTHRITIS, LOWER LEG, LEFT 05/10/2009  . SINUSITIS - ACUTE-NOS 07/22/2009  . TESTICULAR HYPOFUNCTION 06/26/2009   Past Surgical History:  Procedure Laterality Date  . KNEE SURGERY    . LUMBAR LAMINECTOMY  2008   with revision  . SPINAL FUSION  2011  . spinal fusion revision  2012    reports that he has never smoked. He has never used smokeless tobacco. He reports that he does not drink alcohol or use drugs. family history includes Allergy (severe) in his unknown relative; Asthma in his unknown relative; Diabetes  in his unknown relative; Hypothyroidism in his brother, father, mother, and sister. Allergies  Allergen Reactions  . Iodine   . Levofloxacin    Current Outpatient Medications on File Prior to Visit  Medication Sig Dispense Refill  . EPINEPHrine (EPIPEN 2-PAK) 0.3 mg/0.3 mL IJ SOAJ injection EpiPen 2-Pak 0.3 mg/0.3 mL injection, auto-injector    . fluticasone (FLONASE) 50 MCG/ACT nasal spray SPRAY 1 SPRAY INTO EACH NOSTRIL EVERY DAY  5  . levothyroxine (SYNTHROID, LEVOTHROID) 150 MCG tablet 1 TABLET 30 MINUTES BEFORE BREAKFAST DAILY 90 tablet 1  . loratadine (CLARITIN) 10 MG tablet Take 10 mg by mouth daily.    . Multiple Vitamin (MULTIVITAMIN) capsule Take 1 capsule by mouth daily.      . nitroGLYCERIN (NITRODUR - DOSED IN MG/24 HR) 0.2 mg/hr patch Place 1/4 patch to the affected area daily. 30 patch 1  . Olopatadine HCl 0.6 % SOLN     . Testosterone 30 MG/ACT SOLN USE 1 APPLICATION UNDER EACH UNDERARM DAILY 90 mL 2   No current facility-administered medications on file prior to visit.    Review of Systems Constitutional: Negative for other unusual diaphoresis, sweats, appetite or weight changes HENT: Negative for other worsening hearing loss, ear pain, facial swelling, mouth sores or neck stiffness.   Eyes: Negative for other worsening pain, redness or other visual disturbance.  Respiratory: Negative for other stridor or swelling Cardiovascular: Negative for other palpitations or other chest pain  Gastrointestinal: Negative for worsening diarrhea or loose stools, blood in stool, distention or other pain Genitourinary: Negative for hematuria, flank pain or other change in urine volume.  Musculoskeletal: Negative for myalgias or other joint swelling.  Skin: Negative for other color change, or other wound or worsening drainage.  Neurological: Negative for other syncope or numbness. Hematological: Negative for other adenopathy or swelling Psychiatric/Behavioral: Negative for  hallucinations, other worsening agitation, SI, self-injury, or new decreased concentration All other system neg per pt    Objective:   Physical Exam BP 116/82   Pulse 71   Temp 98.1 F (36.7 C) (Oral)   Ht 5\' 10"  (1.778 m)   Wt 151 lb (68.5 kg)   SpO2 97%   BMI 21.67 kg/m  VS noted,  Constitutional: Pt is oriented to person, place, and time. Appears well-developed and well-nourished, in no significant distress and comfortable Head: Normocephalic and atraumatic  Eyes: Conjunctivae and EOM are normal. Pupils are equal, round, and reactive to light Right Ear: External ear normal without discharge Left Ear: External ear normal without discharge Nose: Nose without discharge or deformity Mouth/Throat: Oropharynx is without other ulcerations and moist  Neck: Normal range of motion. Neck supple. No JVD present. No tracheal deviation present or significant neck LA or mass Cardiovascular: Normal rate, regular rhythm, normal heart sounds and intact distal pulses.   Pulmonary/Chest: WOB normal and breath sounds without rales or wheezing  Abdominal: Soft. Bowel sounds are normal. NT. No HSM  Musculoskeletal: Normal range of motion. Exhibits no edema Lymphadenopathy: Has no other cervical adenopathy.  Neurological: Pt is alert and oriented to person, place, and time. Pt has normal reflexes. No cranial nerve deficit. Motor grossly intact, Gait intact Skin: Skin is warm and dry. No rash noted or new ulcerations Psychiatric:  Has normal mood and affect. Behavior is normal without agitation No other exam findings Lab Results  Component Value Date   WBC 7.0 01/07/2018   HGB 13.6 01/07/2018   HCT 39.9 01/07/2018   PLT 262.0 01/07/2018   GLUCOSE 80 05/23/2017   CHOL 151 05/23/2017   TRIG 64.0 05/23/2017   HDL 47.60 05/23/2017   LDLDIRECT 146.2 11/19/2012   LDLCALC 91 05/23/2017   ALT 20 05/23/2017   AST 17 05/23/2017   NA 138 05/23/2017   K 4.4 05/23/2017   CL 100 05/23/2017   CREATININE  0.84 05/23/2017   BUN 16 05/23/2017   CO2 32 05/23/2017   TSH 1.02 03/18/2018   PSA 2.45 05/23/2017          Assessment & Plan:

## 2018-06-04 NOTE — Assessment & Plan Note (Signed)
/  Mild to mod, for predpac asd,  to f/u any worsening symptoms or concerns 

## 2018-06-04 NOTE — Patient Instructions (Signed)

## 2018-06-08 DIAGNOSIS — J301 Allergic rhinitis due to pollen: Secondary | ICD-10-CM | POA: Diagnosis not present

## 2018-06-08 DIAGNOSIS — J3089 Other allergic rhinitis: Secondary | ICD-10-CM | POA: Diagnosis not present

## 2018-06-09 ENCOUNTER — Other Ambulatory Visit (INDEPENDENT_AMBULATORY_CARE_PROVIDER_SITE_OTHER): Payer: BLUE CROSS/BLUE SHIELD

## 2018-06-09 DIAGNOSIS — Z0000 Encounter for general adult medical examination without abnormal findings: Secondary | ICD-10-CM | POA: Diagnosis not present

## 2018-06-09 DIAGNOSIS — Z125 Encounter for screening for malignant neoplasm of prostate: Secondary | ICD-10-CM | POA: Diagnosis not present

## 2018-06-09 DIAGNOSIS — R5383 Other fatigue: Secondary | ICD-10-CM | POA: Diagnosis not present

## 2018-06-09 LAB — BASIC METABOLIC PANEL
BUN: 17 mg/dL (ref 6–23)
CO2: 33 mEq/L — ABNORMAL HIGH (ref 19–32)
Calcium: 9.6 mg/dL (ref 8.4–10.5)
Chloride: 100 mEq/L (ref 96–112)
Creatinine, Ser: 1.1 mg/dL (ref 0.40–1.50)
GFR: 68.19 mL/min (ref >60.00)
Glucose, Bld: 85 mg/dL (ref 70–99)
Potassium: 4.5 mEq/L (ref 3.5–5.1)
Sodium: 140 mEq/L (ref 135–145)

## 2018-06-09 LAB — URINALYSIS, ROUTINE W REFLEX MICROSCOPIC
BILIRUBIN URINE: NEGATIVE
HGB URINE DIPSTICK: NEGATIVE
Ketones, ur: NEGATIVE
Leukocytes,Ua: NEGATIVE
NITRITE: NEGATIVE
PH: 7 (ref 5.0–8.0)
RBC / HPF: NONE SEEN (ref 0–?)
Specific Gravity, Urine: 1.02 (ref 1.000–1.030)
TOTAL PROTEIN, URINE-UPE24: NEGATIVE
Urine Glucose: NEGATIVE
Urobilinogen, UA: 0.2 (ref 0.0–1.0)

## 2018-06-09 LAB — HEPATIC FUNCTION PANEL
ALT: 28 U/L (ref 0–53)
AST: 31 U/L (ref 0–37)
Albumin: 4.3 g/dL (ref 3.5–5.2)
Alkaline Phosphatase: 55 U/L (ref 39–117)
Bilirubin, Direct: 0.1 mg/dL (ref 0.0–0.3)
TOTAL PROTEIN: 6.4 g/dL (ref 6.0–8.3)
Total Bilirubin: 0.7 mg/dL (ref 0.2–1.2)

## 2018-06-09 LAB — CBC WITH DIFFERENTIAL/PLATELET
BASOS ABS: 0.1 10*3/uL (ref 0.0–0.1)
BASOS PCT: 1.3 % (ref 0.0–3.0)
Eosinophils Absolute: 0.1 10*3/uL (ref 0.0–0.7)
Eosinophils Relative: 1.6 % (ref 0.0–5.0)
HCT: 44.4 % (ref 39.0–52.0)
Hemoglobin: 15 g/dL (ref 13.0–17.0)
Lymphocytes Relative: 35.1 % (ref 12.0–46.0)
Lymphs Abs: 2.2 10*3/uL (ref 0.7–4.0)
MCHC: 33.9 g/dL (ref 30.0–36.0)
MCV: 88.4 fl (ref 78.0–100.0)
Monocytes Absolute: 0.5 10*3/uL (ref 0.1–1.0)
Monocytes Relative: 7.9 % (ref 3.0–12.0)
NEUTROS ABS: 3.3 10*3/uL (ref 1.4–7.7)
Neutrophils Relative %: 54.1 % (ref 43.0–77.0)
Platelets: 234 10*3/uL (ref 150.0–400.0)
RBC: 5.02 Mil/uL (ref 4.22–5.81)
RDW: 13.2 % (ref 11.5–15.5)
WBC: 6.2 10*3/uL (ref 4.0–10.5)

## 2018-06-09 LAB — LIPID PANEL
CHOLESTEROL: 199 mg/dL (ref 0–200)
HDL: 66.3 mg/dL (ref >39.00)
LDL Cholesterol: 117 mg/dL — ABNORMAL HIGH (ref 0–99)
NonHDL: 132.99
TRIGLYCERIDES: 79 mg/dL (ref 0.0–149.0)
Total CHOL/HDL Ratio: 3
VLDL: 15.8 mg/dL (ref 0.0–40.0)

## 2018-06-09 LAB — PSA: PSA: 1 ng/mL (ref 0.10–4.00)

## 2018-06-09 LAB — CORTISOL: Cortisol, Plasma: 3.7 ug/dL

## 2018-06-09 LAB — TSH: TSH: 2.72 u[IU]/mL (ref 0.35–4.50)

## 2018-06-15 ENCOUNTER — Encounter: Payer: Self-pay | Admitting: Internal Medicine

## 2018-06-15 DIAGNOSIS — E538 Deficiency of other specified B group vitamins: Secondary | ICD-10-CM

## 2018-06-15 DIAGNOSIS — E559 Vitamin D deficiency, unspecified: Secondary | ICD-10-CM

## 2018-06-15 DIAGNOSIS — E785 Hyperlipidemia, unspecified: Secondary | ICD-10-CM

## 2018-06-15 DIAGNOSIS — J301 Allergic rhinitis due to pollen: Secondary | ICD-10-CM | POA: Diagnosis not present

## 2018-06-15 DIAGNOSIS — J3089 Other allergic rhinitis: Secondary | ICD-10-CM | POA: Diagnosis not present

## 2018-06-16 ENCOUNTER — Encounter: Payer: Self-pay | Admitting: Sports Medicine

## 2018-06-16 ENCOUNTER — Ambulatory Visit: Payer: Self-pay

## 2018-06-16 ENCOUNTER — Ambulatory Visit (INDEPENDENT_AMBULATORY_CARE_PROVIDER_SITE_OTHER): Payer: BLUE CROSS/BLUE SHIELD | Admitting: Sports Medicine

## 2018-06-16 VITALS — BP 106/68 | Ht 70.0 in | Wt 150.0 lb

## 2018-06-16 DIAGNOSIS — M25551 Pain in right hip: Secondary | ICD-10-CM

## 2018-06-16 DIAGNOSIS — S76111A Strain of right quadriceps muscle, fascia and tendon, initial encounter: Secondary | ICD-10-CM | POA: Diagnosis not present

## 2018-06-16 MED ORDER — DOXYCYCLINE HYCLATE 100 MG PO TABS: 100.0000 mg | ORAL_TABLET | Freq: Two times a day (BID) | ORAL | 0 refills | Status: DC

## 2018-06-16 NOTE — Assessment & Plan Note (Signed)
-  Limited diagnostic ultrasound was performed today of the right quadriceps muscle.  It demonstrates a small partial evulsion injury of his rectus femoris. -Discussed with him the course of healing which can be up to 12 weeks of healing for this. -Recommend activity modification and most likely will not be able to run his [address] -Anti-inflammatories as needed -Ice as needed -Quadriceps exercises were taught today for him to complete daily.  Avoid deep squatting, dead lifting her leg press while this is healing for the first 6 weeks -Follow-up in 4 to 6 weeks with repeat ultrasound to confirm healing

## 2018-06-16 NOTE — Progress Notes (Signed)
Joseph Santana - 63 y.o. male MRN [id number]  Date of birth: March 11, 1956   Chief complaint: Right hip pain  SUBJECTIVE:    History of present illness: 63 year old male who is an avid long-distance runner planning on running the [place] this April who presents with a chief complaint of right groin injury.  He states he was lifting 450 pounds with a leg press when he felt a sudden pull in his right groin.  Immediately afterwards he noticed pain and swelling in his groin.  With concern that he tore a muscle, he was seen at [facility] for further evaluation.  Initial x-rays were negative for any fractures or a avulsion injuries.  He subsequently had MRIs which were performed demonstrating moderate osteoarthritis of the right hip however no evidence of tendon damage.  He received an ultrasound-guided steroid injection approximately 3 weeks ago and received 2 weeks of benefit from this.  He states his groin is very bothersome.  It is rated 3 out of 10 at rest and 8 out of 10 with running.  He has tried to run upwards of 3 miles without any success.  He does have pain just with ambulation as well.  Pain is described as sharp and primarily localized to the anterior thigh and groin region.  Denies any numbness or tingling of the extremity.  He does have a history of low back surgery.  He does endorse weakness especially with the pushoff phase and running.  He is tried Voltaren gel, compression, ice and activity modification without any significant improvement.  He is here today for a second opinion.   Review of systems:  As stated above   Interval past medical history, surgical history, family history, and social history obtained and are unchanged.   Of note he has a history of a spinal fusion with revision back in 2011 and 2012.  Medications reviewed and unchanged. Allergies reviewed and unchanged.  Of note he has allergies to iodine and Levaquin.  OBJECTIVE:  Physical exam: Vital signs are  reviewed. BP 106/68   Ht 5\' 10"  (1.778 m)   Wt 150 lb (68 kg)   BMI 21.52 kg/m   Gen.: Alert, oriented, appears stated age, in no apparent distress Integumentary: No rashes, ecchymoses or skin lesions Neurologic:  Sensation is intact to light touch L4-S1, negative straight leg raise on the right Gait: No associated limp although slow Musculoskeletal: Inspection of the right hip/groin region demonstrates no acute abnormality.  He has significant tenderness to palpation over his AIIS as well as deep in his medial groin region.  He has full range of motion hip flexion extension as well as internal rotation external rotation.  He has pain which is reproduced with resisted hip flexion.  Strength testing is 4.5 out of 5 in hip flexion.  5-5 in hip extension.  Negative logroll.  Discomfort with Stinchfield test.  Negative Pearlean Brownie and Fadir testing.  Of note he does have tight abductors as well.  He is neurovascularly intact.  ULTRASOUND: R hip   limited diagnostic ultrasound obtained of patient's R hip.  -Longitudinal view of the ASIS demonstrates no acute abnormality. -Longitudinal view of the AIIS demonstrates a partial avulsion of the rectus femoris tendon.  There is associated hypoechoic swelling. -Hypoechoic irregularity of the reflected tendon of the rectus femoris is also seen in an oblique view. -Distal insertion of the rectus femoris is intact on the lesser trochanter.  IMPRESSION: findings consistent with partial rectus femoris avulsion injury.  Ultrasound and interpretation by Dr. Laureen Ochs and  Sibyl Parr. Fields, MD   ASSESSMENT & PLAN: Quadriceps strain, right, initial encounter -Limited diagnostic ultrasound was performed today of the right quadriceps muscle.  It demonstrates a small partial evulsion injury of his rectus femoris. -Discussed with him the course of healing which can be up to 12 weeks of healing for this. -Recommend activity modification and most likely will not be able to run  his [address] -Anti-inflammatories as needed -Ice as needed -Quadriceps exercises were taught today for him to complete daily.  Avoid deep squatting, dead lifting her leg press while this is healing for the first 6 weeks -Follow-up in 4 to 6 weeks with repeat ultrasound to confirm healing   Orders Placed This Encounter  Procedures  . [place] COMPLETE JOINT SPACE STRUCTURES LOW RIGHT    Standing Status:   Future    Number of Occurrences:   1    Standing Expiration Date:   08/16/2019    Order Specific Question:   Reason for Exam (SYMPTOM  OR DIAGNOSIS REQUIRED)    Answer:   right hip pain    Order Specific Question:   Preferred imaging location?    Answer:   Internal     Gustavus Messing, DO Sports Medicine Fellow [facility]  I observed and examined the patient with the [facility] and agree with assessment and plan.  Note reviewed and modified by me. Sterling Big, MD

## 2018-06-19 ENCOUNTER — Encounter: Payer: Self-pay | Admitting: Sports Medicine

## 2018-06-29 DIAGNOSIS — J3089 Other allergic rhinitis: Secondary | ICD-10-CM | POA: Diagnosis not present

## 2018-06-29 DIAGNOSIS — J301 Allergic rhinitis due to pollen: Secondary | ICD-10-CM | POA: Diagnosis not present

## 2018-07-06 DIAGNOSIS — J3089 Other allergic rhinitis: Secondary | ICD-10-CM | POA: Diagnosis not present

## 2018-07-06 DIAGNOSIS — J301 Allergic rhinitis due to pollen: Secondary | ICD-10-CM | POA: Diagnosis not present

## 2018-07-07 ENCOUNTER — Other Ambulatory Visit: Payer: Self-pay | Admitting: Endocrinology

## 2018-07-08 ENCOUNTER — Other Ambulatory Visit: Payer: BLUE CROSS/BLUE SHIELD

## 2018-07-13 DIAGNOSIS — M25551 Pain in right hip: Secondary | ICD-10-CM | POA: Diagnosis not present

## 2018-07-13 DIAGNOSIS — J3089 Other allergic rhinitis: Secondary | ICD-10-CM | POA: Diagnosis not present

## 2018-07-13 DIAGNOSIS — J301 Allergic rhinitis due to pollen: Secondary | ICD-10-CM | POA: Diagnosis not present

## 2018-07-14 ENCOUNTER — Other Ambulatory Visit: Payer: Self-pay

## 2018-07-14 ENCOUNTER — Ambulatory Visit: Payer: Self-pay

## 2018-07-14 ENCOUNTER — Encounter: Payer: Self-pay | Admitting: Family Medicine

## 2018-07-14 ENCOUNTER — Ambulatory Visit (INDEPENDENT_AMBULATORY_CARE_PROVIDER_SITE_OTHER): Payer: BLUE CROSS/BLUE SHIELD | Admitting: Family Medicine

## 2018-07-14 VITALS — BP 122/71 | Ht 70.0 in | Wt 150.0 lb

## 2018-07-14 DIAGNOSIS — S76111D Strain of right quadriceps muscle, fascia and tendon, subsequent encounter: Secondary | ICD-10-CM | POA: Diagnosis not present

## 2018-07-14 DIAGNOSIS — M25551 Pain in right hip: Secondary | ICD-10-CM

## 2018-07-14 NOTE — Patient Instructions (Signed)
Today we repeated our ultrasound of your quadriceps muscle.  It demonstrates evidence of increased blood flow and interval healing of your rectus femoris muscle i.e. your quadriceps muscle.  Below is the list of added exercises that you may do to help further strengthen this muscle.  If you continue to have ongoing debility and pain, we discussed the possibility of platelet rich plasma injection of this area.  This is a potential option in the next 1 to 2 months if you continue to have slow improvement.  A handout was given for a adjustable groin body helix to help offload the rectus femoris.  Exercises: 1.  Seated leg raises with 2 to 5 pounds, 3 sets of 15, progress on a weekly basis no more than 2 to 3 pounds 2.  Recumbent on your side hip abduction exercises with weight 2 to 5 pounds, 3 sets of 15 3.  Wall sit 45 degrees for 20-30 second intervals 4.  Side to side squat slides without resistance, 5 down and 5 back  Continue with physical therapy modalities.  No nitro patch as you are getting a headache from this.  Follow-up for repeat ultrasound in 4 to 6 weeks.  Call ahead if you would like to look more into the cost of platelet rich plasma injection.  As always, you may call for questions.  Our goal is to get you ready for the [facility] in September.

## 2018-07-14 NOTE — Assessment & Plan Note (Signed)
-  Repeat ultrasound reassuring today with evidence of healing -Modified exercises performed today for continued strengthening of his quadriceps given significant weakness noted on exam -Discussed aerodyne biking in a seated as well as upright standing postures for intervals -No running until strength has improved significantly -Side to side squat slides added to his training regimen -No more than 5 to 10 pounds when performing leg raises both in a seated and recumbent position -Discussed the possibility of groin strap via body helix for an extra layer of compression on the origin of the rectus femoris.  He was given information to purchase this -He may be a candidate for platelet rich plasma injection if he continues to have slow healing within the next 4 to 6 weeks.  Patient to call if he would like to look into this further -Goal is to run the [facility] in September 2020

## 2018-07-14 NOTE — Progress Notes (Signed)
Joseph Santana - 63 y.o. male MRN [id number]  Date of birth: 05-02-1955   Chief complaint: Right hip quadriceps strain  SUBJECTIVE:    History of present illness: 63 year old male who presents today for follow-up of right hip injury.  His injury occurred 2 months ago while performing a leg press.  He subsequently saw an orthopedic surgeon who ordered an MRI demonstrating moderate hip osteoarthritis without evidence of labral injury.  He followed up here with Dr. Darrick Penna and myself and had an ultrasound performed.  The ultrasound demonstrated a partial evulsion of his rectus femoris muscle on the right.  There is no significant evidence of neovascularization at that time.  He was shutdown from running for which he was planning on running the [facility].  The [place] now has been postponed until September which he still plans on running.  Today, he states his pain with walking has significantly improved.  He is able to walk over a mile as well as bike up to 7 to 8 miles without any significant pain.  He did try running 1 mile and found severe pain in the anterior hip.  He had to stop afterwards.  He has been doing a home exercise program with leg raises and avoiding deep squatting.  He tried upwards of 15 pounds however felt incredibly weak.  He does endorse right leg and hip weakness.  Pain at rest is very minimal.  With increased activity, it does go up to 6 out of 10 and is described as sharp.  Denies any associated numbness or tingling of his extremities.  He tried nitroglycerin patches however had a headache from them so discontinued them.  He does place Voltaren gel over the area which helps.  He wears compression shorts however has not experimented with a sleeve or strap yet over his hip.  He is requesting repeat ultrasound today.   Review of systems:  As stated above   Interval past medical history, surgical history, family history, and social history obtained and are unchanged.    Of note, he has moderate osteoarthritis of the right hip.  No history of prior hip surgeries or labral injuries.  No family history of bone or joint diseases.  Medications reviewed.  Of note he uses Voltaren gel.  He no longer uses nitroglycerin patches. Allergies reviewed and unchanged.  OBJECTIVE:  Physical exam: Vital signs are reviewed. BP 122/71    Ht 5\' 10"  (1.778 m)    Wt 150 lb (68 kg)    BMI 21.52 kg/m   Gen.: Alert, oriented, appears stated age, in great shape, in no apparent distress Integumentary: No rashes or ecchymoses Neurologic:  Sensation is intact to light touch L4 S1 Gait: normal without associated limp Musculoskeletal: Inspection of his right hip demonstrates no obvious abnormalities.  Mild tenderness palpation over his ASIS.  Moderate tenderness to palpation over his AIIS.  No greater trochanter tenderness.  Full range of motion of his hip.  Strength testing of his quadriceps is 4.5 out of 5 compared to his left side.  Hamstring strength 5 out of 5.  Hip abductor strength 4.5 out of 5 on the right as compared to the left side.  Abductor strength 5 out of 5.  Negative logroll test.  Negative impingement test.  Negative Stinchfield test.  Negative Faber test.  Increased pain elicited with firing of his quadriceps muscle on the right.  Neurovascularly intact.  ULTRASOUND: Right hip Limited diagnostic ultrasound obtained of patient's right hip.  -  ASIS was visualized in the longitudinal plane demonstrating no irregularities of the sartorius muscle. -AIIS was visualized in both the transverse and longitudinal planes.  It demonstrates cortical irregularity at the origin of the rectus femoris muscle.  There is a partially avulsed fragment/step-off deformity which is healing.  No surrounding hypoechoic fluid in the rectus femoris muscle.  Significant evidence of neovascularization both in the transverse and longitudinal planes noted.  IMPRESSION: findings consistent with partial  avulsion of the rectus femoris muscle on the right with interval healing and evidence of neovascularization..    ASSESSMENT & PLAN: Quadriceps muscle strain, right, subsequent encounter -Repeat ultrasound reassuring today with evidence of healing -Modified exercises performed today for continued strengthening of his quadriceps given significant weakness noted on exam -Discussed aerodyne biking in a seated as well as upright standing postures for intervals -No running until strength has improved significantly -Side to side squat slides added to his training regimen -No more than 5 to 10 pounds when performing leg raises both in a seated and recumbent position -Discussed the possibility of groin strap via body helix for an extra layer of compression on the origin of the rectus femoris.  He was given information to purchase this -He may be a candidate for platelet rich plasma injection if he continues to have slow healing within the next 4 to 6 weeks.  Patient to call if he would like to look into this further -Goal is to run the [facility] in September 2020   Orders Placed This Encounter  Procedures   [place] LIMITED JOINT SPACE STRUCTURES LOW RIGHT    Standing Status:   Future    Number of Occurrences:   1    Standing Expiration Date:   09/13/2019    Order Specific Question:   Reason for Exam (SYMPTOM  OR DIAGNOSIS REQUIRED)    Answer:   right hip pain    Order Specific Question:   Preferred imaging location?    Answer:   Internal     Gustavus Messing, DO Sports Medicine Fellow [facility]

## 2018-07-16 ENCOUNTER — Ambulatory Visit: Payer: BLUE CROSS/BLUE SHIELD | Admitting: Endocrinology

## 2018-07-16 DIAGNOSIS — M25551 Pain in right hip: Secondary | ICD-10-CM | POA: Diagnosis not present

## 2018-07-21 DIAGNOSIS — M25551 Pain in right hip: Secondary | ICD-10-CM | POA: Diagnosis not present

## 2018-07-22 DIAGNOSIS — J301 Allergic rhinitis due to pollen: Secondary | ICD-10-CM | POA: Diagnosis not present

## 2018-07-22 DIAGNOSIS — J3089 Other allergic rhinitis: Secondary | ICD-10-CM | POA: Diagnosis not present

## 2018-07-23 DIAGNOSIS — M25551 Pain in right hip: Secondary | ICD-10-CM | POA: Diagnosis not present

## 2018-07-27 DIAGNOSIS — M25551 Pain in right hip: Secondary | ICD-10-CM | POA: Diagnosis not present

## 2018-07-27 DIAGNOSIS — J3089 Other allergic rhinitis: Secondary | ICD-10-CM | POA: Diagnosis not present

## 2018-07-27 DIAGNOSIS — J301 Allergic rhinitis due to pollen: Secondary | ICD-10-CM | POA: Diagnosis not present

## 2018-07-29 DIAGNOSIS — M25551 Pain in right hip: Secondary | ICD-10-CM | POA: Diagnosis not present

## 2018-08-03 DIAGNOSIS — J3089 Other allergic rhinitis: Secondary | ICD-10-CM | POA: Diagnosis not present

## 2018-08-03 DIAGNOSIS — M25551 Pain in right hip: Secondary | ICD-10-CM | POA: Diagnosis not present

## 2018-08-03 DIAGNOSIS — J301 Allergic rhinitis due to pollen: Secondary | ICD-10-CM | POA: Diagnosis not present

## 2018-08-05 ENCOUNTER — Other Ambulatory Visit: Payer: Self-pay | Admitting: Endocrinology

## 2018-08-05 DIAGNOSIS — M25551 Pain in right hip: Secondary | ICD-10-CM | POA: Diagnosis not present

## 2018-08-05 DIAGNOSIS — E230 Hypopituitarism: Secondary | ICD-10-CM

## 2018-08-10 DIAGNOSIS — J301 Allergic rhinitis due to pollen: Secondary | ICD-10-CM | POA: Diagnosis not present

## 2018-08-10 DIAGNOSIS — J3089 Other allergic rhinitis: Secondary | ICD-10-CM | POA: Diagnosis not present

## 2018-08-10 DIAGNOSIS — M25551 Pain in right hip: Secondary | ICD-10-CM | POA: Diagnosis not present

## 2018-08-11 ENCOUNTER — Other Ambulatory Visit: Payer: Self-pay

## 2018-08-11 ENCOUNTER — Encounter: Payer: Self-pay | Admitting: Family Medicine

## 2018-08-11 ENCOUNTER — Ambulatory Visit (INDEPENDENT_AMBULATORY_CARE_PROVIDER_SITE_OTHER): Payer: BLUE CROSS/BLUE SHIELD | Admitting: Family Medicine

## 2018-08-11 ENCOUNTER — Ambulatory Visit: Payer: Self-pay

## 2018-08-11 VITALS — BP 124/77 | Ht 70.0 in | Wt 150.0 lb

## 2018-08-11 DIAGNOSIS — M25551 Pain in right hip: Secondary | ICD-10-CM | POA: Diagnosis not present

## 2018-08-11 DIAGNOSIS — S76011D Strain of muscle, fascia and tendon of right hip, subsequent encounter: Secondary | ICD-10-CM | POA: Diagnosis not present

## 2018-08-11 NOTE — Progress Notes (Addendum)
Joseph Santana - 63 y.o. male MRN [id number]  Date of birth: 12-12-1955   Chief complaint: Right hip pain, strain of his rectus femoris follow-up  SUBJECTIVE:    History of present illness: 63 year old male who presents today for follow-up of a partial evulsion of his rectus femoris on the right proximally.  It is been 3 months of conservative treatment of this injury.  Treatment today included: Ultrasound-guided intra-articular hip injection which provided only mild relief.  This was followed by stopping running as he was training for the [facility].  He has been in formal physical therapy 2-3 sessions per week and has been focusing on quadriceps, hamstring, and core strengthening.  He is using nitroglycerin patches over the area and wearing a body helix hip brace for further treatment.  He has also had serial ultrasounds which have showed interval healing of his avulsion injury.  Since starting treatment for his injury, he states he is approximately 30% improved however he is now pleased that he is able to start running.  He ran a 3 mile run 3 weeks ago and had little to no pain which is a huge improvement.  He tried to run 4 miles 2 days later and had to stop secondary to significant hip and anterior thigh pain.  Since then, he has been crosstraining every single day via biking, elliptical, and weightlifting.  He notices deep squats and lunges do exacerbate his symptoms.  He still is pleased that he is starting to improve.  Denies any new symptoms.  No numbness or tingling of the extremity.  No low back pain.  No severe groin pain.  No left-sided lower extremity symptoms.   Review of systems:  Per HPI; in addition no fever, no rash, no additional weakness, no additional numbness, no additional paresthesias, and no additional fall/injury   Interval past medical history, surgical history, family history, and social history obtained and are unchanged.   Of note, he has moderate right hip  osteoarthritis confirmed by MRI from 2020.  Medications reviewed and unchanged.  Of note he takes Synthroid, Claritin, nitroglycerin patches. Allergies reviewed and unchanged.  Of note he is allergic to Levaquin and iodine  OBJECTIVE:  Physical exam: Vital signs are reviewed. BP 124/77    Ht 5\' 10"  (1.778 m)    Wt 150 lb (68 kg)    BMI 21.52 kg/m   Gen.: Alert, oriented, appears stated age, in no apparent distress Integumentary: No rashes or ecchymoses Neurologic:  Sensation is intact light touch L4-S1 bilaterally Gait: normal without associated limp Musculoskeletal: Inspection of the right hip demonstrates no obvious abnormalities.  Just trace tenderness to palpation over his AIIS.  No ASIS tenderness.  He has full range of motion in hip flexion, extension adduction and abduction.  Strength testing is 5 out of 5 in hip flexion, 5 out of 5 in hip adduction, 5 out of 5 in hip abduction.  Negative logroll test.  Trace discomfort with Stinchfield testing.  Negative Pearlean Brownie and Fabere testing.  Neurovascularly intact.  Inspection of the left hip demonstrates no obvious abnormalities.  No tenderness palpation.  Full range of motion in all ranges of motion of the hip.  Strength testing is 5 out of 5 in all ranges of motion.  Negative logroll testing.  Neurovascularly intact.  ULTRASOUND: Right hip Limited diagnostic ultrasound obtained of patient's right hip.  -ASIS is visualized in the longitudinal plane.  No irregularities of the sartorius muscle at its origin. -AIIS is visualized  in both the longitudinal and transverse planes.  In the longitudinal plane, there is evidence of a prior avulsion injury at the origin of the rectus femoris muscle.  No surrounding hypoechoic edema which is an improvement.  Significant evidence of neovascularization at the site of injury as seen on Doppler flow.  No continued step-off deformity or cortical interruption with evidence of a healed avulsion.  IMPRESSION:  findings consistent with prior rectus femoris avulsion injury that is healed.     ASSESSMENT & PLAN: Strain of flexor muscle of hip, right, subsequent encounter - US performed today with evidence of a healed avulsion injury to his rectus femoris -Symptomatically improving -Return to run protocol outlined today.  He will start with running no more than 3 miles 2 times per week.  Increase by 10% in duration or intensity on a week by week basis.  Add a third day of training in 3 to 4 weeks. -Continue body helix hip sleeve -Continue nitroglycerin patch while symptomatic -Avoid deep squatting or lunges -Goal is to take the next 2 months to strengthen the quadriceps muscles in preparation for a 12 to 16-week training circuit [place] [facility] this upcoming September -At this point, I do not recommend repeating the ultrasound given the findings that the injury has healed unless he has any setbacks.   Orders Placed This Encounter  Procedures   [place] LIMITED JOINT SPACE STRUCTURES LOW RIGHT    Standing Status:   Future    Number of Occurrences:   1    Standing Expiration Date:   10/10/2019    Order Specific Question:   Reason for Exam (SYMPTOM  OR DIAGNOSIS REQUIRED)    Answer:   right hip pain    Order Specific Question:   Preferred imaging location?    Answer:   Internal    Gustavus MessingAJ Pinney, DO Sports Medicine Fellow [place]  Addendum:  Patient seen with fellow, agree with his note and findings.  Reviewed ultrasound and treatment with patient and how to progress activity level.

## 2018-08-11 NOTE — Assessment & Plan Note (Signed)
-   US performed today

## 2018-08-12 ENCOUNTER — Other Ambulatory Visit (INDEPENDENT_AMBULATORY_CARE_PROVIDER_SITE_OTHER): Payer: BLUE CROSS/BLUE SHIELD

## 2018-08-12 DIAGNOSIS — M25551 Pain in right hip: Secondary | ICD-10-CM | POA: Diagnosis not present

## 2018-08-12 DIAGNOSIS — E230 Hypopituitarism: Secondary | ICD-10-CM

## 2018-08-12 DIAGNOSIS — E538 Deficiency of other specified B group vitamins: Secondary | ICD-10-CM

## 2018-08-12 DIAGNOSIS — E785 Hyperlipidemia, unspecified: Secondary | ICD-10-CM

## 2018-08-12 DIAGNOSIS — E559 Vitamin D deficiency, unspecified: Secondary | ICD-10-CM | POA: Diagnosis not present

## 2018-08-12 LAB — LIPID PANEL
Cholesterol: 176 mg/dL (ref 0–200)
HDL: 63 mg/dL
LDL Cholesterol: 101 mg/dL — ABNORMAL HIGH (ref 0–99)
NonHDL: 113.3
Total CHOL/HDL Ratio: 3
Triglycerides: 62 mg/dL (ref 0.0–149.0)
VLDL: 12.4 mg/dL (ref 0.0–40.0)

## 2018-08-12 LAB — VITAMIN D 25 HYDROXY (VIT D DEFICIENCY, FRACTURES): VITD: 67.3 ng/mL (ref 30.00–100.00)

## 2018-08-12 LAB — VITAMIN B12: Vitamin B-12: 1197 pg/mL — ABNORMAL HIGH (ref 211–911)

## 2018-08-12 LAB — TESTOSTERONE: Testosterone: 225.58 ng/dL — ABNORMAL LOW (ref 300.00–890.00)

## 2018-08-17 DIAGNOSIS — J301 Allergic rhinitis due to pollen: Secondary | ICD-10-CM | POA: Diagnosis not present

## 2018-08-17 DIAGNOSIS — M25551 Pain in right hip: Secondary | ICD-10-CM | POA: Diagnosis not present

## 2018-08-17 DIAGNOSIS — J3089 Other allergic rhinitis: Secondary | ICD-10-CM | POA: Diagnosis not present

## 2018-08-18 ENCOUNTER — Telehealth: Payer: Self-pay | Admitting: Endocrinology

## 2018-08-18 NOTE — Progress Notes (Signed)
Patient ID: Joseph Santana, male   DOB: [birth year], 63 y.o.   MRN: [id number]            Reason for Appointment:  Follow-up of low testosterone    Today's office visit was provided via telemedicine using video technique Explained to the patient and the the limitations of evaluation and management by telemedicine and the availability of in person appointments.  The patient understood the limitations and agreed to proceed. Patient also understood that the telehealth visit is billable. . Location of the patient: Home . Location of the provider: Office Only the patient and myself were participating in the encounter     History of Present Illness:   HYPOGONADISM: Prior history: He was diagnosed to have a low testosterone level in 2012 or so when he was coming to his physician with complaints of increased fatigue.  Not clear what his baseline testosterone level was but he had a level of about 130 in 2013 around the time when he was having back surgery No record of detail evaluation with pituitary function available He thinks he was tried on AndroGel initially but because of skin irritation he was changed to testosterone injections.  He may have been prescribed Axiron also but does not remember this He thinks he had significantly better with taking testosterone injections, apparently was taking about 100 mg every 2 weeks He stopped taking the injections 6-8 months prior to his initial consultation  as they were not renewed and he had a change in his PCP  RECENT history: On his initial consultation he was complaining of fatigue  Baseline testosterone was 208 with LH of 2.1 and normal prolactin, free testosterone not done by the lab  He had been on a trial of clomiphene half tablet, initially 3 times a week since 01/11/16 With this he had more energy after starting treatment With this his testosterone level came back to normal until 2/18 when it was 494, subsequently had been progressively  declining and was 273 done in 1/19 This was despite taking 50 mg 5 days a week; with this he was complaining of more fatigue and sluggishness and decreased motivation   Also complaining of decreased libido  RECENT history: He was subsequently taking Fortesta gel but this was changed because of redness, irritation and sunburn-like reaction on the skin Because of the skin irritation he was changed from 1 pump on the thigh on each side to the Axiron preparation, 1 pump under each arm  Initially his testosterone level was 370 With this his subjective symptoms of fatigue and decreased libido were controlled and he is still feeling fairly good  Although his testosterone level was unusually high at 622 and 9/19 on the same dose it had come down to 367 in December and now 226  He still having difficulties with applying the Axiron at times and it may drip down and not stay in the armpit He was told to apply it with the proper technique and check out the instead illustration and video on how to do it but he has not done so He thinks he has been applying this very regularly  However he has not had any unusual fatigue, sluggishness but also is dealing with some recent injury  His hemoglobin and also last PSA have been quite normal and he has not had any symptoms of prostatism  He is not very keen on using testosterone injections, currently the oral and nasal preparations are not covered by insurance  He  has had the following testosterone levels   Lab Results  Component Value Date   TESTOSTERONE 225.58 (L) 08/12/2018   TESTOSTERONE 367.12 03/18/2018   TESTOSTERONE 622.09 01/07/2018   TESTOSTERONE 370.72 11/26/2017    Lab Results  Component Value Date   LH 3.39 03/03/2017   LH 2.94 02/28/2016   LH 2.10 01/10/2016   Lab Results  Component Value Date   HGB 15.0 06/09/2018   Hypothyroidism was first diagnosed  about 30 years ago  At the time of diagnosis patient was having symptoms of   fatigue but he is not sure about all the other symptoms Most probably tested also because of strong family history of thyroid disease  The patient has been treated with  levothyroxine in various doses in the past This is now followed by PCP  He feels fairly good with his energy level  He takes his thyroid supplement consistently in the morning  TSH level is most recently normal with 150 mcg daily         Patient's weight history is as follows:  Wt Readings from Last 3 Encounters:  08/11/18 150 lb (68 kg)  07/14/18 150 lb (68 kg)  06/16/18 150 lb (68 kg)    Thyroid function results have been as follows:  Lab Results  Component Value Date   TSH 2.72 06/09/2018   TSH 1.02 03/18/2018   TSH 0.47 01/07/2018   FREET4 1.28 01/07/2018   FREET4 1.21 12/02/2016   FREET4 1.28 01/10/2016      Past Medical History:  Diagnosis Date  . ALLERGIC REACTION 07/22/2009  . ALLERGIC RHINITIS 10/08/2006  . B12 DEFICIENCY 05/14/2010  . Carpal tunnel syndrome 12/31/2007  . Degeneration of cervical intervertebral disc 12/31/2007  . HYPERLIPIDEMIA 10/08/2006  . HYPOTHYROIDISM 10/08/2006  . Irritable bowel syndrome 03/03/2008  . LUMBAR RADICULOPATHY, RIGHT 12/31/2006  . OSTEOARTHRITIS, LOWER LEG, LEFT 05/10/2009  . SINUSITIS - ACUTE-NOS 07/22/2009  . TESTICULAR HYPOFUNCTION 06/26/2009    Past Surgical History:  Procedure Laterality Date  . KNEE SURGERY    . LUMBAR LAMINECTOMY  2008   with revision  . SPINAL FUSION  2011  . spinal fusion revision  2012    Family History  Problem Relation Age of Onset  . Hypothyroidism Father   . Asthma Unknown   . Diabetes Unknown   . Allergy (severe) Unknown   . Hypothyroidism Mother   . Hypothyroidism Sister   . Hypothyroidism Brother     Social History:  reports that he has never smoked. He has never used smokeless tobacco. He reports that he does not drink alcohol or use drugs.  Allergies:  Allergies  Allergen Reactions  . Iodine   . Levofloxacin      Allergies as of 08/19/2018      Reactions   Iodine    Levofloxacin       Medication List       Accurate as of Aug 18, 2018  9:08 PM. Always use your most recent med list.        doxycycline 100 MG tablet Commonly known as:  VIBRA-TABS Take 1 tablet (100 mg total) by mouth 2 (two) times daily.   EpiPen 2-Pak 0.3 mg/0.3 mL Soaj injection Generic drug:  EPINEPHrine EpiPen 2-Pak 0.3 mg/0.3 mL injection, auto-injector   fluticasone 50 MCG/ACT nasal spray Commonly known as:  FLONASE SPRAY 1 SPRAY INTO EACH NOSTRIL EVERY DAY   levothyroxine 150 MCG tablet Commonly known as:  SYNTHROID 1 TABLET 30 MINUTES BEFORE BREAKFAST  DAILY   loratadine 10 MG tablet Commonly known as:  CLARITIN Take 10 mg by mouth daily.   multivitamin capsule Take 1 capsule by mouth daily.   nitroGLYCERIN 0.2 mg/hr patch Commonly known as:  NITRODUR - Dosed in mg/24 hr Place 1/4 patch to the affected area daily.   Olopatadine HCl 0.6 % Soln   Testosterone 30 MG/ACT Soln USE 1 APPLICATION UNDER EACH UNDERARM DAILY       Review of Systems  He has had chronic insomnia        Examination:    There were no vitals taken for this visit.    Assessment:  HYPOGONADISM:   He has hypogonadotropic hypogonadism with baseline testosterone of 208 and has been symptomatic He is now taking Axiron instead of Fortesta gel, 2 pumps daily He has not tolerated other topical preparations However he has difficulty getting absorption consistently because of inconsistent application technique and his level is low again  This is not correlating with any physical symptoms currently and he has been regular with applying the dose daily  HYPOTHYROIDISM, TSH normal with 150 mcg and he will continue to follow-up with PCP   PLAN:  Discussed proper technique for applying the Axiron Again asked him to watch the video online Since he has difficulty with losing the medication on some days he can try to apply a  third dose on the next day  Otherwise follow-up in 3 months Consider nasal or oral formulations if available on his formulary  I  Reather Littler 08/18/2018, 9:08 PM

## 2018-08-18 NOTE — Telephone Encounter (Signed)
Patient is returning call.  °

## 2018-08-18 NOTE — Telephone Encounter (Signed)
Called pt back and completed his pre rooming process.

## 2018-08-19 ENCOUNTER — Encounter: Payer: Self-pay | Admitting: Endocrinology

## 2018-08-19 ENCOUNTER — Ambulatory Visit (INDEPENDENT_AMBULATORY_CARE_PROVIDER_SITE_OTHER): Payer: BLUE CROSS/BLUE SHIELD | Admitting: Endocrinology

## 2018-08-19 DIAGNOSIS — E230 Hypopituitarism: Secondary | ICD-10-CM

## 2018-08-19 DIAGNOSIS — M25551 Pain in right hip: Secondary | ICD-10-CM | POA: Diagnosis not present

## 2018-08-24 DIAGNOSIS — M25551 Pain in right hip: Secondary | ICD-10-CM | POA: Diagnosis not present

## 2018-08-26 DIAGNOSIS — M25551 Pain in right hip: Secondary | ICD-10-CM | POA: Diagnosis not present

## 2018-08-27 DIAGNOSIS — J3089 Other allergic rhinitis: Secondary | ICD-10-CM | POA: Diagnosis not present

## 2018-08-27 DIAGNOSIS — J301 Allergic rhinitis due to pollen: Secondary | ICD-10-CM | POA: Diagnosis not present

## 2018-09-08 ENCOUNTER — Encounter: Payer: Self-pay | Admitting: Family Medicine

## 2018-09-08 ENCOUNTER — Other Ambulatory Visit: Payer: Self-pay

## 2018-09-08 ENCOUNTER — Ambulatory Visit (INDEPENDENT_AMBULATORY_CARE_PROVIDER_SITE_OTHER): Payer: BLUE CROSS/BLUE SHIELD | Admitting: Family Medicine

## 2018-09-08 VITALS — Ht 70.0 in | Wt 150.0 lb

## 2018-09-08 DIAGNOSIS — M25551 Pain in right hip: Secondary | ICD-10-CM | POA: Diagnosis not present

## 2018-09-08 DIAGNOSIS — J3089 Other allergic rhinitis: Secondary | ICD-10-CM | POA: Diagnosis not present

## 2018-09-08 DIAGNOSIS — J301 Allergic rhinitis due to pollen: Secondary | ICD-10-CM | POA: Diagnosis not present

## 2018-09-08 NOTE — Progress Notes (Signed)
Virtual Visit via Telephone Note  I connected with Joseph Santana on 09/08/18 at  9:00 AM EDT by telephone and verified that I am speaking with the correct person using two identifiers.  Location: Patient: Home  Provider: [facility] Sports Medicine - Maish Vaya   I discussed the limitations, risks, security and privacy concerns of performing an evaluation and management service by telephone and the availability of in person appointments. I also discussed with the patient that there may be a patient responsible charge related to this service. The patient expressed understanding and agreed to proceed.   History of Present Illness: Patient reports he's overall doing well. He continues to do his home exercises from physical therapy. Wearing nitro patches, changing daily. He's been running more - this past week he did 35 miles including 3 days of running in a row with a 10 mile run. Pain is 3-4/10 at beginning of run and eases to 1/10 in middle of run. He reports 6 miles first week after last visit, 18 next, 11 next week of total miles ran. Bodyhelix sleeve felt limiting so not using.   Observations/Objective: No acute distress, breathing unlabored.  Assessment and Plan: Right rectus femoris avulsion - clinically healed by ultrasound last visit.  Still with some pain but this is not accelerating despite him ramping up his mileage.  He has increased this more than I would recommend - went over the 10% increase recommendation - advised he back down to 20 miles a week and increase weekly from there.  Continue nitro patches, home exercise program.  Let us know how he's doing in 1 month but call sooner if he's having problems.    I discussed the assessment and treatment plan with the patient. The patient was provided an opportunity to ask questions and all were answered. The patient agreed with the plan and demonstrated an understanding of the instructions.   The patient was advised to call back or seek  an in-person evaluation if the symptoms worsen or if the condition fails to improve as anticipated.  I provided 15 minutes of non-face-to-face time during this encounter from 9:09am to 9:24am.   Norton Blizzard, MD

## 2018-09-11 ENCOUNTER — Encounter

## 2018-09-14 DIAGNOSIS — J301 Allergic rhinitis due to pollen: Secondary | ICD-10-CM | POA: Diagnosis not present

## 2018-09-14 DIAGNOSIS — J3089 Other allergic rhinitis: Secondary | ICD-10-CM | POA: Diagnosis not present

## 2018-09-18 DIAGNOSIS — M25551 Pain in right hip: Secondary | ICD-10-CM | POA: Diagnosis not present

## 2018-09-19 ENCOUNTER — Other Ambulatory Visit: Payer: Self-pay | Admitting: Endocrinology

## 2018-09-21 ENCOUNTER — Other Ambulatory Visit: Payer: Self-pay | Admitting: Endocrinology

## 2018-09-21 DIAGNOSIS — J3089 Other allergic rhinitis: Secondary | ICD-10-CM | POA: Diagnosis not present

## 2018-09-21 DIAGNOSIS — J301 Allergic rhinitis due to pollen: Secondary | ICD-10-CM | POA: Diagnosis not present

## 2018-09-21 NOTE — Telephone Encounter (Signed)
Please refill if appropriate

## 2018-09-22 DIAGNOSIS — M25551 Pain in right hip: Secondary | ICD-10-CM | POA: Diagnosis not present

## 2018-09-25 DIAGNOSIS — H52203 Unspecified astigmatism, bilateral: Secondary | ICD-10-CM | POA: Diagnosis not present

## 2018-09-25 DIAGNOSIS — H524 Presbyopia: Secondary | ICD-10-CM | POA: Diagnosis not present

## 2018-09-25 DIAGNOSIS — H2513 Age-related nuclear cataract, bilateral: Secondary | ICD-10-CM | POA: Diagnosis not present

## 2018-09-25 DIAGNOSIS — H5213 Myopia, bilateral: Secondary | ICD-10-CM | POA: Diagnosis not present

## 2018-09-25 DIAGNOSIS — M25551 Pain in right hip: Secondary | ICD-10-CM | POA: Diagnosis not present

## 2018-09-28 DIAGNOSIS — J301 Allergic rhinitis due to pollen: Secondary | ICD-10-CM | POA: Diagnosis not present

## 2018-09-28 DIAGNOSIS — J3081 Allergic rhinitis due to animal (cat) (dog) hair and dander: Secondary | ICD-10-CM | POA: Diagnosis not present

## 2018-09-28 DIAGNOSIS — J3089 Other allergic rhinitis: Secondary | ICD-10-CM | POA: Diagnosis not present

## 2018-10-05 DIAGNOSIS — J3089 Other allergic rhinitis: Secondary | ICD-10-CM | POA: Diagnosis not present

## 2018-10-05 DIAGNOSIS — J301 Allergic rhinitis due to pollen: Secondary | ICD-10-CM | POA: Diagnosis not present

## 2018-10-12 DIAGNOSIS — J301 Allergic rhinitis due to pollen: Secondary | ICD-10-CM | POA: Diagnosis not present

## 2018-10-12 DIAGNOSIS — J3089 Other allergic rhinitis: Secondary | ICD-10-CM | POA: Diagnosis not present

## 2018-10-19 DIAGNOSIS — J301 Allergic rhinitis due to pollen: Secondary | ICD-10-CM | POA: Diagnosis not present

## 2018-10-19 DIAGNOSIS — J3089 Other allergic rhinitis: Secondary | ICD-10-CM | POA: Diagnosis not present

## 2018-10-26 DIAGNOSIS — J301 Allergic rhinitis due to pollen: Secondary | ICD-10-CM | POA: Diagnosis not present

## 2018-10-26 DIAGNOSIS — J3089 Other allergic rhinitis: Secondary | ICD-10-CM | POA: Diagnosis not present

## 2018-10-29 ENCOUNTER — Other Ambulatory Visit: Payer: Self-pay | Admitting: Endocrinology

## 2018-11-02 DIAGNOSIS — J3089 Other allergic rhinitis: Secondary | ICD-10-CM | POA: Diagnosis not present

## 2018-11-02 DIAGNOSIS — J301 Allergic rhinitis due to pollen: Secondary | ICD-10-CM | POA: Diagnosis not present

## 2018-11-03 DIAGNOSIS — J301 Allergic rhinitis due to pollen: Secondary | ICD-10-CM | POA: Diagnosis not present

## 2018-11-03 DIAGNOSIS — J3089 Other allergic rhinitis: Secondary | ICD-10-CM | POA: Diagnosis not present

## 2018-11-16 DIAGNOSIS — J301 Allergic rhinitis due to pollen: Secondary | ICD-10-CM | POA: Diagnosis not present

## 2018-11-16 DIAGNOSIS — J3089 Other allergic rhinitis: Secondary | ICD-10-CM | POA: Diagnosis not present

## 2018-11-23 DIAGNOSIS — J301 Allergic rhinitis due to pollen: Secondary | ICD-10-CM | POA: Diagnosis not present

## 2018-11-23 DIAGNOSIS — J3089 Other allergic rhinitis: Secondary | ICD-10-CM | POA: Diagnosis not present

## 2018-12-01 DIAGNOSIS — J3089 Other allergic rhinitis: Secondary | ICD-10-CM | POA: Diagnosis not present

## 2018-12-01 DIAGNOSIS — J301 Allergic rhinitis due to pollen: Secondary | ICD-10-CM | POA: Diagnosis not present

## 2018-12-07 ENCOUNTER — Other Ambulatory Visit: Payer: Self-pay

## 2018-12-07 ENCOUNTER — Other Ambulatory Visit (INDEPENDENT_AMBULATORY_CARE_PROVIDER_SITE_OTHER): Payer: BC Managed Care – PPO

## 2018-12-07 DIAGNOSIS — J301 Allergic rhinitis due to pollen: Secondary | ICD-10-CM | POA: Diagnosis not present

## 2018-12-07 DIAGNOSIS — J3089 Other allergic rhinitis: Secondary | ICD-10-CM | POA: Diagnosis not present

## 2018-12-07 DIAGNOSIS — E230 Hypopituitarism: Secondary | ICD-10-CM

## 2018-12-08 LAB — TESTOSTERONE: Testosterone: 591.37 ng/dL (ref 300.00–890.00)

## 2018-12-08 NOTE — Progress Notes (Signed)
Patient ID: Joseph NuttingKenton H Mcgaha, male   DOB: 04/20/1955, 63 y.o.   MRN: [id number]            Reason for Appointment:  Follow-up of low testosterone    Today's office visit was provided via telemedicine using video technique Explained to the patient and the the limitations of evaluation and management by telemedicine and the availability of in person appointments.  The patient understood the limitations and agreed to proceed. Patient also understood that the telehealth visit is billable. . Location of the patient: Home . Location of the provider: Office Only the patient and myself were participating in the encounter     History of Present Illness:   HYPOGONADISM: Prior history: He was diagnosed to have a low testosterone level in 2012 or so when he was coming to his physician with complaints of increased fatigue.  Not clear what his baseline testosterone level was but he had a level of about 130 in 2013 around the time when he was having back surgery No record of detail evaluation with pituitary function available He thinks he was tried on AndroGel initially but because of skin irritation he was changed to testosterone injections.  He may have been prescribed Axiron also but does not remember this He thinks he had significantly better with taking testosterone injections, apparently was taking about 100 mg every 2 weeks He stopped taking the injections 6-8 months prior to his initial consultation  as they were not renewed and he had a change in his PCP  RECENT history: On his initial consultation he was complaining of fatigue  Baseline testosterone was 208 with LH of 2.1 and normal prolactin, free testosterone not done by the lab  He had been on a trial of clomiphene half tablet, initially 3 times a week since 01/11/16 With this he had more energy after starting treatment With this his testosterone level came back to normal until 2/18 when it was 494, subsequently had been progressively  declining and was 273 done in 1/19 This was despite taking 50 mg 5 days a week; with this he was complaining of more fatigue and sluggishness and decreased motivation   Also complaining of decreased libido  RECENT history: He was subsequently taking Fortesta gel but this was changed because of redness, irritation and sunburn-like reaction on the skin Because of the skin irritation he was changed from 1 pump on each thigh  to the Axiron preparation, 1 pump under each arm  Initially his testosterone level was 370 With this his subjective symptoms of fatigue and decreased libido were controlled and he is still feeling fairly good  His testosterone level has fluctuated with the Axiron He is supposed to apply 1 application nodes under each arm daily  He is having somewhat less difficulties with applying the Axiron at times; previously it would drip down and not stay in the armpit The technique was discussed on the last visit and he thinks he is able to do it better now However he thinks that he may change the timing of the application if he is planning to go running as he feels that this may interfere with absorption  Recently he has not had any unusual fatigue or lack of motivation  His testosterone level is markedly improved at 591  His last hemoglobin and also last PSA have been quite normal and he has not had any urinary symptoms  He is not very keen on using testosterone injections, currently the oral and nasal preparations  are not covered by insurance  He has had the following testosterone levels   Lab Results  Component Value Date   TESTOSTERONE 591.37 12/07/2018   TESTOSTERONE 225.58 (L) 08/12/2018   TESTOSTERONE 367.12 03/18/2018   TESTOSTERONE 622.09 01/07/2018    Lab Results  Component Value Date   LH 3.39 03/03/2017   LH 2.94 02/28/2016   LH 2.10 01/10/2016   Lab Results  Component Value Date   HGB 15.0 06/09/2018   Hypothyroidism was first diagnosed  about 30  years ago  At the time of diagnosis patient was having symptoms of  fatigue but he is not sure about all the other symptoms Most probably tested also because of strong family history of thyroid disease  The patient has been treated with  levothyroxine in various doses in the past This is now followed by PCP  He feels fairly good with his energy level  He takes his thyroid supplement consistently in the morning  TSH level is on the last labs normal with 150 mcg daily         Patient's weight history is as follows:  Wt Readings from Last 3 Encounters:  09/08/18 150 lb (68 kg)  08/11/18 150 lb (68 kg)  07/14/18 150 lb (68 kg)    Thyroid function results have been as follows:  Lab Results  Component Value Date   TSH 2.72 06/09/2018   TSH 1.02 03/18/2018   TSH 0.47 01/07/2018   FREET4 1.28 01/07/2018   FREET4 1.21 12/02/2016   FREET4 1.28 01/10/2016      Past Medical History:  Diagnosis Date  . ALLERGIC REACTION 07/22/2009  . ALLERGIC RHINITIS 10/08/2006  . B12 DEFICIENCY 05/14/2010  . Carpal tunnel syndrome 12/31/2007  . Degeneration of cervical intervertebral disc 12/31/2007  . HYPERLIPIDEMIA 10/08/2006  . HYPOTHYROIDISM 10/08/2006  . Irritable bowel syndrome 03/03/2008  . LUMBAR RADICULOPATHY, RIGHT 12/31/2006  . OSTEOARTHRITIS, LOWER LEG, LEFT 05/10/2009  . SINUSITIS - ACUTE-NOS 07/22/2009  . TESTICULAR HYPOFUNCTION 06/26/2009    Past Surgical History:  Procedure Laterality Date  . KNEE SURGERY    . LUMBAR LAMINECTOMY  2008   with revision  . SPINAL FUSION  2011  . spinal fusion revision  2012    Family History  Problem Relation Age of Onset  . Hypothyroidism Father   . Asthma Unknown   . Diabetes Unknown   . Allergy (severe) Unknown   . Hypothyroidism Mother   . Hypothyroidism Sister   . Hypothyroidism Brother     Social History:  reports that he has never smoked. He has never used smokeless tobacco. He reports that he does not drink alcohol or use drugs.   Allergies:  Allergies  Allergen Reactions  . Iodine   . Levofloxacin     Allergies as of 12/09/2018      Reactions   Iodine    Levofloxacin       Medication List       Accurate as of December 08, 2018  9:26 PM. If you have any questions, ask your nurse or doctor.        doxycycline 100 MG tablet Commonly known as: VIBRA-TABS Take 1 tablet (100 mg total) by mouth 2 (two) times daily.   EpiPen 2-Pak 0.3 mg/0.3 mL Soaj injection Generic drug: EPINEPHrine EpiPen 2-Pak 0.3 mg/0.3 mL injection, auto-injector   fluticasone 50 MCG/ACT nasal spray Commonly known as: FLONASE SPRAY 1 SPRAY INTO EACH NOSTRIL EVERY DAY   levothyroxine 150 MCG tablet Commonly known  as: SYNTHROID TAKE 1 TABLET 30 MINUTES BEFORE BREAKFAST DAILY   loratadine 10 MG tablet Commonly known as: CLARITIN Take 10 mg by mouth daily.   multivitamin capsule Take 1 capsule by mouth daily.   nitroGLYCERIN 0.2 mg/hr patch Commonly known as: NITRODUR - Dosed in mg/24 hr Place 1/4 patch to the affected area daily.   Olopatadine HCl 0.6 % Soln   Testosterone 30 MG/ACT Soln USE 1 APPLICATION UNDER EACH UNDERARM DAILY       Review of Systems  He has had chronic insomnia        Examination:    There were no vitals taken for this visit.    Assessment:  HYPOGONADISM:   He has hypogonadotropic hypogonadism with baseline testosterone of 208 and has been symptomatic He is now taking Axiron 1 pump under each arm Currently subjectively doing well with no symptoms of unusual fatigue  With proper application technique of the Axiron his level has come up to nearly 600 now Although he probably needs a lower dose he appears to have had fluctuating levels in the past and has had no adverse effects with the testosterone currently  HYPOTHYROIDISM, TSH normal in February   PLAN:  He will stay with the same dosage regimen of 1 pump under each arm of the axilla We will try to be consistent with the time of  application and discussed that he does not need to be concerned about exercise and swelling affecting the absorption  We will follow-up in 6 months  Consider nasal or oral formulations if available on his formulary next year  Recommend follow-up with his PCP for review of his thyroid again  Elayne Snare 12/08/2018, 9:26 PM

## 2018-12-09 ENCOUNTER — Encounter: Payer: Self-pay | Admitting: Endocrinology

## 2018-12-09 ENCOUNTER — Other Ambulatory Visit: Payer: Self-pay

## 2018-12-09 ENCOUNTER — Ambulatory Visit (INDEPENDENT_AMBULATORY_CARE_PROVIDER_SITE_OTHER): Payer: BC Managed Care – PPO | Admitting: Endocrinology

## 2018-12-09 DIAGNOSIS — E230 Hypopituitarism: Secondary | ICD-10-CM | POA: Diagnosis not present

## 2018-12-10 ENCOUNTER — Ambulatory Visit: Payer: BLUE CROSS/BLUE SHIELD | Admitting: Endocrinology

## 2018-12-14 DIAGNOSIS — J3089 Other allergic rhinitis: Secondary | ICD-10-CM | POA: Diagnosis not present

## 2018-12-14 DIAGNOSIS — J301 Allergic rhinitis due to pollen: Secondary | ICD-10-CM | POA: Diagnosis not present

## 2018-12-17 DIAGNOSIS — Z91013 Allergy to seafood: Secondary | ICD-10-CM | POA: Diagnosis not present

## 2018-12-17 DIAGNOSIS — T63441D Toxic effect of venom of bees, accidental (unintentional), subsequent encounter: Secondary | ICD-10-CM | POA: Diagnosis not present

## 2018-12-17 DIAGNOSIS — J301 Allergic rhinitis due to pollen: Secondary | ICD-10-CM | POA: Diagnosis not present

## 2018-12-17 DIAGNOSIS — J3089 Other allergic rhinitis: Secondary | ICD-10-CM | POA: Diagnosis not present

## 2018-12-22 DIAGNOSIS — J301 Allergic rhinitis due to pollen: Secondary | ICD-10-CM | POA: Diagnosis not present

## 2018-12-22 DIAGNOSIS — J3089 Other allergic rhinitis: Secondary | ICD-10-CM | POA: Diagnosis not present

## 2018-12-28 DIAGNOSIS — J3089 Other allergic rhinitis: Secondary | ICD-10-CM | POA: Diagnosis not present

## 2018-12-28 DIAGNOSIS — J301 Allergic rhinitis due to pollen: Secondary | ICD-10-CM | POA: Diagnosis not present

## 2018-12-30 ENCOUNTER — Other Ambulatory Visit: Payer: Self-pay | Admitting: Endocrinology

## 2018-12-30 NOTE — Telephone Encounter (Signed)
Please refill if appropriate

## 2019-01-04 DIAGNOSIS — J301 Allergic rhinitis due to pollen: Secondary | ICD-10-CM | POA: Diagnosis not present

## 2019-01-04 DIAGNOSIS — J3089 Other allergic rhinitis: Secondary | ICD-10-CM | POA: Diagnosis not present

## 2019-01-07 ENCOUNTER — Ambulatory Visit (INDEPENDENT_AMBULATORY_CARE_PROVIDER_SITE_OTHER): Payer: BC Managed Care – PPO

## 2019-01-07 ENCOUNTER — Other Ambulatory Visit: Payer: Self-pay

## 2019-01-07 DIAGNOSIS — Z23 Encounter for immunization: Secondary | ICD-10-CM | POA: Diagnosis not present

## 2019-01-11 DIAGNOSIS — J3089 Other allergic rhinitis: Secondary | ICD-10-CM | POA: Diagnosis not present

## 2019-01-11 DIAGNOSIS — J301 Allergic rhinitis due to pollen: Secondary | ICD-10-CM | POA: Diagnosis not present

## 2019-01-26 DIAGNOSIS — J301 Allergic rhinitis due to pollen: Secondary | ICD-10-CM | POA: Diagnosis not present

## 2019-01-26 DIAGNOSIS — J3089 Other allergic rhinitis: Secondary | ICD-10-CM | POA: Diagnosis not present

## 2019-02-01 DIAGNOSIS — J301 Allergic rhinitis due to pollen: Secondary | ICD-10-CM | POA: Diagnosis not present

## 2019-02-01 DIAGNOSIS — J3089 Other allergic rhinitis: Secondary | ICD-10-CM | POA: Diagnosis not present

## 2019-02-08 DIAGNOSIS — J3089 Other allergic rhinitis: Secondary | ICD-10-CM | POA: Diagnosis not present

## 2019-02-08 DIAGNOSIS — J301 Allergic rhinitis due to pollen: Secondary | ICD-10-CM | POA: Diagnosis not present

## 2019-02-10 DIAGNOSIS — J301 Allergic rhinitis due to pollen: Secondary | ICD-10-CM | POA: Diagnosis not present

## 2019-02-11 DIAGNOSIS — J3089 Other allergic rhinitis: Secondary | ICD-10-CM | POA: Diagnosis not present

## 2019-02-16 DIAGNOSIS — J301 Allergic rhinitis due to pollen: Secondary | ICD-10-CM | POA: Diagnosis not present

## 2019-02-16 DIAGNOSIS — J3089 Other allergic rhinitis: Secondary | ICD-10-CM | POA: Diagnosis not present

## 2019-02-22 DIAGNOSIS — J3089 Other allergic rhinitis: Secondary | ICD-10-CM | POA: Diagnosis not present

## 2019-02-22 DIAGNOSIS — J301 Allergic rhinitis due to pollen: Secondary | ICD-10-CM | POA: Diagnosis not present

## 2019-03-01 DIAGNOSIS — J3089 Other allergic rhinitis: Secondary | ICD-10-CM | POA: Diagnosis not present

## 2019-03-01 DIAGNOSIS — J301 Allergic rhinitis due to pollen: Secondary | ICD-10-CM | POA: Diagnosis not present

## 2019-03-08 DIAGNOSIS — J3089 Other allergic rhinitis: Secondary | ICD-10-CM | POA: Diagnosis not present

## 2019-03-08 DIAGNOSIS — J301 Allergic rhinitis due to pollen: Secondary | ICD-10-CM | POA: Diagnosis not present

## 2019-03-15 DIAGNOSIS — J3089 Other allergic rhinitis: Secondary | ICD-10-CM | POA: Diagnosis not present

## 2019-03-15 DIAGNOSIS — J301 Allergic rhinitis due to pollen: Secondary | ICD-10-CM | POA: Diagnosis not present

## 2019-03-22 DIAGNOSIS — J3089 Other allergic rhinitis: Secondary | ICD-10-CM | POA: Diagnosis not present

## 2019-03-22 DIAGNOSIS — J301 Allergic rhinitis due to pollen: Secondary | ICD-10-CM | POA: Diagnosis not present

## 2019-03-30 DIAGNOSIS — J3089 Other allergic rhinitis: Secondary | ICD-10-CM | POA: Diagnosis not present

## 2019-03-30 DIAGNOSIS — J301 Allergic rhinitis due to pollen: Secondary | ICD-10-CM | POA: Diagnosis not present

## 2019-04-02 ENCOUNTER — Other Ambulatory Visit: Payer: Self-pay | Admitting: Endocrinology

## 2019-04-05 NOTE — Telephone Encounter (Signed)
Please refill if appropriate

## 2019-04-06 DIAGNOSIS — J301 Allergic rhinitis due to pollen: Secondary | ICD-10-CM | POA: Diagnosis not present

## 2019-04-06 DIAGNOSIS — J3089 Other allergic rhinitis: Secondary | ICD-10-CM | POA: Diagnosis not present

## 2019-04-11 ENCOUNTER — Other Ambulatory Visit: Payer: Self-pay | Admitting: Endocrinology

## 2019-04-12 ENCOUNTER — Telehealth: Payer: Self-pay

## 2019-04-12 DIAGNOSIS — J3089 Other allergic rhinitis: Secondary | ICD-10-CM | POA: Diagnosis not present

## 2019-04-12 DIAGNOSIS — J301 Allergic rhinitis due to pollen: Secondary | ICD-10-CM | POA: Diagnosis not present

## 2019-04-12 NOTE — Telephone Encounter (Signed)
Patient is scheduled   

## 2019-04-12 NOTE — Telephone Encounter (Signed)
-----   Message from Elayne Snare, MD sent at 04/11/2019  2:20 PM EST ----- Regarding: appt Patient due for appointment in 2/21.  Please call to schedule with labs before appointment

## 2019-04-19 DIAGNOSIS — J301 Allergic rhinitis due to pollen: Secondary | ICD-10-CM | POA: Diagnosis not present

## 2019-04-19 DIAGNOSIS — J3089 Other allergic rhinitis: Secondary | ICD-10-CM | POA: Diagnosis not present

## 2019-04-26 DIAGNOSIS — J3089 Other allergic rhinitis: Secondary | ICD-10-CM | POA: Diagnosis not present

## 2019-04-26 DIAGNOSIS — J301 Allergic rhinitis due to pollen: Secondary | ICD-10-CM | POA: Diagnosis not present

## 2019-05-05 DIAGNOSIS — J3089 Other allergic rhinitis: Secondary | ICD-10-CM | POA: Diagnosis not present

## 2019-05-05 DIAGNOSIS — J301 Allergic rhinitis due to pollen: Secondary | ICD-10-CM | POA: Diagnosis not present

## 2019-05-09 ENCOUNTER — Telehealth: Payer: BC Managed Care – PPO | Admitting: Family

## 2019-05-09 DIAGNOSIS — J019 Acute sinusitis, unspecified: Secondary | ICD-10-CM

## 2019-05-09 MED ORDER — AMOXICILLIN-POT CLAVULANATE 875-125 MG PO TABS: 1.0000 | ORAL_TABLET | Freq: Two times a day (BID) | ORAL | 0 refills | Status: DC

## 2019-05-09 NOTE — Progress Notes (Signed)

## 2019-05-10 DIAGNOSIS — J301 Allergic rhinitis due to pollen: Secondary | ICD-10-CM | POA: Diagnosis not present

## 2019-05-10 DIAGNOSIS — J3089 Other allergic rhinitis: Secondary | ICD-10-CM | POA: Diagnosis not present

## 2019-05-17 DIAGNOSIS — J3089 Other allergic rhinitis: Secondary | ICD-10-CM | POA: Diagnosis not present

## 2019-05-17 DIAGNOSIS — J301 Allergic rhinitis due to pollen: Secondary | ICD-10-CM | POA: Diagnosis not present

## 2019-05-24 DIAGNOSIS — J301 Allergic rhinitis due to pollen: Secondary | ICD-10-CM | POA: Diagnosis not present

## 2019-05-24 DIAGNOSIS — J3089 Other allergic rhinitis: Secondary | ICD-10-CM | POA: Diagnosis not present

## 2019-06-01 DIAGNOSIS — J301 Allergic rhinitis due to pollen: Secondary | ICD-10-CM | POA: Diagnosis not present

## 2019-06-01 DIAGNOSIS — J3089 Other allergic rhinitis: Secondary | ICD-10-CM | POA: Diagnosis not present

## 2019-06-02 ENCOUNTER — Other Ambulatory Visit (INDEPENDENT_AMBULATORY_CARE_PROVIDER_SITE_OTHER): Payer: BC Managed Care – PPO

## 2019-06-02 ENCOUNTER — Other Ambulatory Visit: Payer: Self-pay

## 2019-06-02 DIAGNOSIS — E230 Hypopituitarism: Secondary | ICD-10-CM | POA: Diagnosis not present

## 2019-06-02 LAB — TESTOSTERONE: Testosterone: 192.55 ng/dL — ABNORMAL LOW (ref 300.00–890.00)

## 2019-06-02 LAB — CBC
HCT: 44.4 % (ref 39.0–52.0)
Hemoglobin: 14.9 g/dL (ref 13.0–17.0)
MCHC: 33.4 g/dL (ref 30.0–36.0)
MCV: 90.6 fl (ref 78.0–100.0)
Platelets: 217 10*3/uL (ref 150.0–400.0)
RBC: 4.9 Mil/uL (ref 4.22–5.81)
RDW: 13 % (ref 11.5–15.5)
WBC: 5.2 10*3/uL (ref 4.0–10.5)

## 2019-06-05 ENCOUNTER — Encounter: Payer: Self-pay | Admitting: Internal Medicine

## 2019-06-06 MED ORDER — DOXYCYCLINE HYCLATE 100 MG PO TABS: 100.0000 mg | ORAL_TABLET | Freq: Two times a day (BID) | ORAL | 0 refills | Status: DC

## 2019-06-07 ENCOUNTER — Ambulatory Visit (INDEPENDENT_AMBULATORY_CARE_PROVIDER_SITE_OTHER): Payer: BC Managed Care – PPO | Admitting: Family Medicine

## 2019-06-07 ENCOUNTER — Encounter: Payer: Self-pay | Admitting: Family Medicine

## 2019-06-07 ENCOUNTER — Other Ambulatory Visit: Payer: Self-pay

## 2019-06-07 DIAGNOSIS — M19011 Primary osteoarthritis, right shoulder: Secondary | ICD-10-CM | POA: Diagnosis not present

## 2019-06-07 DIAGNOSIS — M79672 Pain in left foot: Secondary | ICD-10-CM

## 2019-06-07 DIAGNOSIS — M76899 Other specified enthesopathies of unspecified lower limb, excluding foot: Secondary | ICD-10-CM | POA: Diagnosis not present

## 2019-06-07 DIAGNOSIS — M19019 Primary osteoarthritis, unspecified shoulder: Secondary | ICD-10-CM | POA: Insufficient documentation

## 2019-06-07 NOTE — Assessment & Plan Note (Signed)
Ultrasound with no findings concerning for stress fracture of foot for a lateral pain. Did note peroneus brevis tendinopathy present on ultrasound which may benefit from inserts.  Patient also with heel pain which is likely due to decrease in fat pad.  Patient to continue with plantar fascia exercises as he has been doing.  Also counseled on benefit from gel cup (due to heel pain/fat pad atrophy pain).

## 2019-06-07 NOTE — Assessment & Plan Note (Signed)
Exam c/w rotator cuff impingement and likely also osteoarthritis contributing to patient's pain and decrease in movement. -Patient counseled to avoid painful activities (overhead activities, lifting with extended arm)  -NSAIDs and tylenol prn -May consider subacromial injection if pain does not improve with exercise -May consider formal physical therapy if no improvement.  Instructed on home exercise program with theraband and scapular stabilization exercises daily 3 sets of 10 once a day. -Patient to follow-up as needed and will consider further imaging, injection, PT and/or nitro patches as above.

## 2019-06-07 NOTE — Assessment & Plan Note (Signed)
Clinical exam consistent with quadriceps tendinitis.  No concern for ligament etiology. -Ice area 15 minutes at a time 3-4 times a day as needed. -NSAIDs prn pain. -given home exercises to do daily to strengthen quad. -Use sleeve or tendon strap with exercise. -May consider physical therapy. -Use 1/4th nitro patches, change daily, may change every 12 hours if develop headaches - let us know if you need a refill and you're tolerating these -Follow up in 1 month.

## 2019-06-07 NOTE — Progress Notes (Signed)
HPI  CC: Left knee, heel, foot pain  Left knee pain 64 year old male here for anterior, proximal left knee pain after 1 month.  Noticed after he was running on a treadmill.  Previously ran about 65 miles per week but has cut back since to about 35 miles a week.  States that in the last 3 weeks he has only run 3 times per week with 1 to 2 days off in between.  States that so far the pain has not improved.  Patient took 3 days off this past week after doing the elliptical bike which did not hurt and then chose to run 5 miles.  After 1 mile he felt the pain and it continuously got worse during the run.  He however did not need to stop.  Never had any pain like this before.  He denies any numbness, tingling.  He denies any trauma to his knee.  Left heel pain States he has had this plantar heel pain on and off for a couple of years.  States that it is been present this episode for 1 month.  Reports that the pain is the same as before.  He has been trying rolling exercises with his foot as well as stretches that he was previously given for treatment which has not helped.  He denies any new trauma.  He denies any numbness or tingling.  Left lateral foot pain Patient reports that he has had pain over his left lateral foot for the past couple of weeks.  He states that when he runs it worsens.  He states that the pain will start on the lateral aspect under his ankle and then will sometimes radiate over the top of his foot.  He denies any pain like this before.  Denies any numbness or tingling.  States he still has full range of motion.  Right shoulder pain Patient has had right shoulder pain for the past 3 months.  He states that he is unable to do as much overhead activity.  The pain does not radiate down his arm.  He denies any neck pain.  See HPI and/or previous note for associated ROS.  Objective: BP 120/68   Ht 5\' 10"  (1.778 m)   Wt 145 lb (65.8 kg)   BMI 20.81 kg/m  Gen: NAD, well groomed,  a/o x3, normal affect.  CV: Well-perfused. Warm.  Resp: Non-labored.  Neuro: Sensation intact throughout. No gross coordination deficits.  Gait: unremarkable stride without signs of limp or balance issues.  Shoulder, R:  No evidence of bony deformity, asymmetry, or muscle atrophy; No tenderness over long head of biceps (bicipital groove). Mildly TTP at [facility] joint.  Full active and passive range of motion, except in flexion which is limited to 160. Strength 5/5 throughout except supraspinatus weak on exam at 5-/5. No abnormal scapular function observed. Sensation intact. Peripheral pulses intact. Empty can: NEG, Hawkins: Neg, Neer test: NEG, Obrien's test: NEG.  L Knee Exam:Inspection: no deformity, no discoloration. No joint line tenderness -ROM: Extension: 0 degrees; Flexion: 140 degrees. Full range of motion -Patient able to extend and flex his knee on his own, strength 5/5  -Special Tests: Varus Stress: Negative; Valgus Stress: Negative; Posterior drawer: Negative; McMurray: Negative; Thessaly: Negative; Patellar grind: Negative. Clarke's: NEG. -Limb neurovascularly intact, no instability noted  Ankle/Foot, L: TTP on insertion location of peroneus brevis over fifth metatarsal.  TTP at plantar heel.  No visible erythema, swelling, ecchymosis, or bony deformity. No notable pes planus.  Mild cavus. Transverse arch grossly intact; Range of motion is full in all directions. Strength is 5/5 in all directions. No tenderness at the insertion/body/myotendinous junction of the Achilles tendon; No tenderness on posterior aspects of lateral and medial malleolus; Unremarkable calcaneal squeeze; No plantar calcaneal tenderness; No tenderness over the navicular prominence; No tenderness over cuboid; Able to walk 4 steps.   Assessment and plan:  Quadriceps tendinitis Clinical exam consistent with quadriceps tendinitis.  No concern for ligament etiology. -Ice area 15 minutes at a time 3-4 times a day as  needed. -NSAIDs prn pain. -given home exercises to do daily to strengthen quad. -Use sleeve or tendon strap with exercise. -May consider physical therapy. -Use 1/4th nitro patches, change daily, may change every 12 hours if develop headaches - let us know if you need a refill and you're tolerating these -Follow up in 1 month.  Osteoarthritis, shoulder Exam c/w rotator cuff impingement and likely also osteoarthritis contributing to patient's pain and decrease in movement. -Patient counseled to avoid painful activities (overhead activities, lifting with extended arm)  -NSAIDs and tylenol prn -May consider subacromial injection if pain does not improve with exercise -May consider formal physical therapy if no improvement.  Instructed on home exercise program with theraband and scapular stabilization exercises daily 3 sets of 10 once a day. -Patient to follow-up as needed and will consider further imaging, injection, PT and/or nitro patches as above.    Left foot pain Ultrasound with no findings concerning for stress fracture of foot for a lateral pain. Did note peroneus brevis tendinopathy present on ultrasound which may benefit from inserts.  Patient also with heel pain which is likely due to decrease in fat pad.  Patient to continue with plantar fascia exercises as he has been doing.  Also counseled on benefit from gel cup (due to heel pain/fat pad atrophy pain).   Martin[place] Ayodele Hartsock, DO PGY-3, [facility]  06/07/2019 12:19 PM

## 2019-06-07 NOTE — Patient Instructions (Addendum)
You have quadriceps tendinitis. Ice area 15 minutes at a time 3-4 times a day as needed. Aleve 2 tabs twice a day with food OR ibuprofen 600mg  three times a day with food for pain and inflammation if needed. Do home exercises daily as directed. Sleeve or tendon strap with exercise is beneficial. Consider physical therapy. Use 1/4th nitro patches, change daily - let know if you need a refill and you're tolerating these Follow up with me in 1 month.  Do the plantar fascia exercises you'd been doing. You would benefit from inserts (due to the peroneus brevis tendinopathy) and gel cup (due to heel pain/fat pad atrophy pain).  You have rotator cuff impingement of your shoulder. Try to avoid painful activities (overhead activities, lifting with extended arm) as much as possible. Aleve 2 tabs twice a day with food OR ibuprofen 3 tabs three times a day with food for pain and inflammation if needed. Can take tylenol in addition to this. Subacromial injection may be beneficial to help with pain and to decrease inflammation if this continues despite rehab Consider physical therapy with transition to home exercise program. Do home exercise program with theraband and scapular stabilization exercises daily 3 sets of 10 once a day. If not improving at follow-up we will consider further imaging, injection, physical therapy, and/or nitro patches.

## 2019-06-09 ENCOUNTER — Ambulatory Visit (INDEPENDENT_AMBULATORY_CARE_PROVIDER_SITE_OTHER): Payer: BC Managed Care – PPO | Admitting: Endocrinology

## 2019-06-09 ENCOUNTER — Encounter: Payer: Self-pay | Admitting: Endocrinology

## 2019-06-09 ENCOUNTER — Other Ambulatory Visit: Payer: Self-pay

## 2019-06-09 DIAGNOSIS — E039 Hypothyroidism, unspecified: Secondary | ICD-10-CM

## 2019-06-09 DIAGNOSIS — E230 Hypopituitarism: Secondary | ICD-10-CM | POA: Diagnosis not present

## 2019-06-09 MED ORDER — JATENZO 158 MG PO CAPS: 1.0000 | ORAL_CAPSULE | Freq: Two times a day (BID) | ORAL | 1 refills | Status: DC

## 2019-06-09 NOTE — Progress Notes (Signed)
Patient ID: Joseph Santana, male   DOB: 08-17-1955, 64 y.o.   MRN: [id number]            Reason for Appointment:  Follow-up of low testosterone    Today's office visit was provided via telemedicine using video technique Explained to the patient and the the limitations of evaluation and management by telemedicine and the availability of in person appointments.  The patient understood the limitations and agreed to proceed. Patient also understood that the telehealth visit is billable. . Location of the patient: Home . Location of the provider: Office Only the patient and myself were participating in the encounter     History of Present Illness:   HYPOGONADISM: Prior history: He was diagnosed to have a low testosterone level in 2012 or so when he was coming to his physician with complaints of increased fatigue.  Not clear what his baseline testosterone level was but he had a level of about 130 in 2013 around the time when he was having back surgery No record of detail evaluation with pituitary function available He thinks he was tried on AndroGel initially but because of skin irritation he was changed to testosterone injections.  He may have been prescribed Axiron also but does not remember this He thinks he had significantly better with taking testosterone injections, apparently was taking about 100 mg every 2 weeks He stopped taking the injections 6-8 months prior to his initial consultation  as they were not renewed and he had a change in his PCP  RECENT history: On his initial consultation he was complaining of fatigue  Baseline testosterone was 208 with LH of 2.1 and normal prolactin, free testosterone not done by the lab  He had been on a trial of clomiphene half tablet, initially 3 times a week since 01/11/16. With this he had more energy after starting treatment With this his testosterone level came back to normal until 2/18 when it was 494, subsequently had been progressively  declining and was 273 done in 1/19 This was despite taking 50 mg 5 days a week; with this he was complaining of more fatigue and sluggishness and decreased motivation   Also complaining of decreased libido  RECENT history: He was subsequently taking Fortesta gel but this was changed because of redness, irritation and sunburn-like reaction on the skin Because of the skin irritation he was changed from 1 pump on each thigh  to the Axiron preparation, 1 pump under each arm  His testosterone level has fluctuated with the Axiron He is regular with using 1 application dose under each arm daily  He now thinks that he has been able to apply it properly and it does not usually drip down his chest from his underarm Recently has not missed any doses and has been refilling medication regularly  For the last few months he has felt more tired but did not reported any was blaming it on other possible causes or sleep disturbance  His testosterone level which was markedly improved at 591 previously is now below 200  His hemoglobin and also last PSA have been quite normal   Appears that the oral and nasal preparations are still not covered by insurance  He has had the following testosterone levels recently  Lab Results  Component Value Date   TESTOSTERONE 192.55 (L) 06/02/2019   TESTOSTERONE 591.37 12/07/2018   TESTOSTERONE 225.58 (L) 08/12/2018   TESTOSTERONE 367.12 03/18/2018    Lab Results  Component Value Date   LH 3.39  03/03/2017   LH 2.94 02/28/2016   LH 2.10 01/10/2016   Lab Results  Component Value Date   HGB 14.9 06/02/2019   Hypothyroidism was first diagnosed  about 30 years ago  At the time of diagnosis patient was having symptoms of  fatigue but he is not sure about all the other symptoms Most probably tested also because of strong family history of thyroid disease  The patient has been treated with  levothyroxine in various doses in the past This has been followed by  PCP   He takes his thyroid supplement consistently in the morning  TSH level is on the last labs normal with 150 mcg daily and he has been on a stable dose for some time         Patient's weight history is as follows:  Wt Readings from Last 3 Encounters:  06/07/19 145 lb (65.8 kg)  09/08/18 150 lb (68 kg)  08/11/18 150 lb (68 kg)    Thyroid function results have been as follows:  Lab Results  Component Value Date   TSH 2.72 06/09/2018   TSH 1.02 03/18/2018   TSH 0.47 01/07/2018   FREET4 1.28 01/07/2018   FREET4 1.21 12/02/2016   FREET4 1.28 01/10/2016      Past Medical History:  Diagnosis Date  . ALLERGIC REACTION 07/22/2009  . ALLERGIC RHINITIS 10/08/2006  . B12 DEFICIENCY 05/14/2010  . Carpal tunnel syndrome 12/31/2007  . Degeneration of cervical intervertebral disc 12/31/2007  . HYPERLIPIDEMIA 10/08/2006  . HYPOTHYROIDISM 10/08/2006  . Irritable bowel syndrome 03/03/2008  . LUMBAR RADICULOPATHY, RIGHT 12/31/2006  . OSTEOARTHRITIS, LOWER LEG, LEFT 05/10/2009  . SINUSITIS - ACUTE-NOS 07/22/2009  . TESTICULAR HYPOFUNCTION 06/26/2009    Past Surgical History:  Procedure Laterality Date  . KNEE SURGERY    . LUMBAR LAMINECTOMY  2008   with revision  . SPINAL FUSION  2011  . spinal fusion revision  2012    Family History  Problem Relation Age of Onset  . Hypothyroidism Father   . Asthma Unknown   . Diabetes Unknown   . Allergy (severe) Unknown   . Hypothyroidism Mother   . Hypothyroidism Sister   . Hypothyroidism Brother     Social History:  reports that he has never smoked. He has never used smokeless tobacco. He reports that he does not drink alcohol or use drugs.  Allergies:  Allergies  Allergen Reactions  . Iodine   . Levofloxacin     Allergies as of 06/09/2019      Reactions   Iodine    Levofloxacin       Medication List       Accurate as of June 09, 2019  2:01 PM. If you have any questions, ask your nurse or doctor.        clobetasol 0.05  % topical foam Commonly known as: OLUX clobetasol 0.05 % topical foam   doxycycline 100 MG tablet Commonly known as: VIBRA-TABS Take 1 tablet (100 mg total) by mouth 2 (two) times daily.   EpiPen 2-Pak 0.3 mg/0.3 mL Soaj injection Generic drug: EPINEPHrine EpiPen 2-Pak 0.3 mg/0.3 mL injection, auto-injector   fluticasone 50 MCG/ACT nasal spray Commonly known as: FLONASE SPRAY 1 SPRAY INTO EACH NOSTRIL EVERY DAY   levothyroxine 150 MCG tablet Commonly known as: SYNTHROID TAKE 1 TABLET BY MOUTH 30 MINUTES BEFORE BREAKFAST EVERY DAY   loratadine 10 MG tablet Commonly known as: CLARITIN Take 10 mg by mouth daily.   multivitamin capsule Take 1 capsule  by mouth daily.   nitroGLYCERIN 0.2 mg/hr patch Commonly known as: NITRODUR - Dosed in mg/24 hr Place 1/4 patch to the affected area daily.   Olopatadine HCl 0.6 % Soln   Testosterone 30 MG/ACT Soln USE 1 APPLICATION UNDER EACH UNDERARM DAILY       Review of Systems  He has had chronic insomnia        Examination:    There were no vitals taken for this visit.    Assessment:  HYPOGONADISM:   He has hypogonadotropic hypogonadism with baseline testosterone of 208 and has been symptomatic He is now taking Axiron 1 pump under each arm Currently subjectively doing well with no symptoms of unusual fatigue  With proper application technique of the Axiron his level has come up to nearly 600 now Although he probably needs a lower dose he appears to have had fluctuating levels in the past and has had no adverse effects with the testosterone currently  HYPOTHYROIDISM, TSH normal in     PLAN:  He will increase the dose of Axiron to 3 pumps every day for now However he needs to be tried on [employer] as this would be more predictable than Axiron and hopefully keep his levels consistently therapeutic Discussed that this has been evaluated for safety and the label mentions increased blood pressure which is nonspecific and  rarely seen, the same side effects applied to all testosterone supplementation types  He will follow-up in about 6 weeks for labs and also check thyroid levels    Reather Littler 06/09/2019, 2:01 PM

## 2019-06-15 DIAGNOSIS — J301 Allergic rhinitis due to pollen: Secondary | ICD-10-CM | POA: Diagnosis not present

## 2019-06-15 DIAGNOSIS — J3089 Other allergic rhinitis: Secondary | ICD-10-CM | POA: Diagnosis not present

## 2019-06-16 ENCOUNTER — Telehealth: Payer: Self-pay | Admitting: Endocrinology

## 2019-06-16 NOTE — Telephone Encounter (Signed)
Patient called re: [employer] company Scientist, product/process development) requires PA/Medically necessary and why patient needs to take it for [employer]. Patient states [facility] sent our office a request for the above PA last week. Patient requests to be called at ph# [phone].

## 2019-06-17 ENCOUNTER — Telehealth: Payer: Self-pay

## 2019-06-17 NOTE — Telephone Encounter (Signed)
PA for [employer] has been approved.  Approvedtoday Request Reference Number: [id number]. JATENZO CAP 158MG  is approved through 06/16/2020. Your patient may now fill this prescription and it will be covered.

## 2019-06-17 NOTE — Telephone Encounter (Signed)
PA initiated via [email or web address] for Jatenzo 158mg  tablets, taking two tablets by mouth daily.    Antoino Caseres Key: [id number] - PA Case ID: Need help? Call [id number] at [phone] Status Sent to [facility] Jatenzo 158MG  capsules Form [facility] Electronic Prior Authorization Form [phone] NCPDP)

## 2019-06-17 NOTE — Telephone Encounter (Signed)
See PA note for PA details.

## 2019-06-28 DIAGNOSIS — J301 Allergic rhinitis due to pollen: Secondary | ICD-10-CM | POA: Diagnosis not present

## 2019-06-28 DIAGNOSIS — J3089 Other allergic rhinitis: Secondary | ICD-10-CM | POA: Diagnosis not present

## 2019-06-29 ENCOUNTER — Other Ambulatory Visit: Payer: Self-pay

## 2019-06-29 ENCOUNTER — Other Ambulatory Visit (INDEPENDENT_AMBULATORY_CARE_PROVIDER_SITE_OTHER): Payer: BC Managed Care – PPO

## 2019-06-29 DIAGNOSIS — E039 Hypothyroidism, unspecified: Secondary | ICD-10-CM | POA: Diagnosis not present

## 2019-06-29 DIAGNOSIS — E230 Hypopituitarism: Secondary | ICD-10-CM

## 2019-06-29 LAB — T4, FREE: Free T4: 1.04 ng/dL (ref 0.60–1.60)

## 2019-06-29 LAB — TSH: TSH: 2.74 u[IU]/mL (ref 0.35–4.50)

## 2019-06-29 LAB — TESTOSTERONE: Testosterone: 329.83 ng/dL (ref 300.00–890.00)

## 2019-06-30 ENCOUNTER — Ambulatory Visit: Payer: BC Managed Care – PPO | Attending: Internal Medicine

## 2019-06-30 DIAGNOSIS — Z20822 Contact with and (suspected) exposure to covid-19: Secondary | ICD-10-CM | POA: Diagnosis not present

## 2019-07-01 LAB — NOVEL CORONAVIRUS, NAA: SARS-CoV-2, NAA: NOT DETECTED

## 2019-07-04 ENCOUNTER — Other Ambulatory Visit: Payer: Self-pay | Admitting: Endocrinology

## 2019-07-05 DIAGNOSIS — J3089 Other allergic rhinitis: Secondary | ICD-10-CM | POA: Diagnosis not present

## 2019-07-05 DIAGNOSIS — J301 Allergic rhinitis due to pollen: Secondary | ICD-10-CM | POA: Diagnosis not present

## 2019-07-06 DIAGNOSIS — J301 Allergic rhinitis due to pollen: Secondary | ICD-10-CM | POA: Diagnosis not present

## 2019-07-06 DIAGNOSIS — J0100 Acute maxillary sinusitis, unspecified: Secondary | ICD-10-CM | POA: Diagnosis not present

## 2019-07-06 DIAGNOSIS — Z91013 Allergy to seafood: Secondary | ICD-10-CM | POA: Diagnosis not present

## 2019-07-06 DIAGNOSIS — J3089 Other allergic rhinitis: Secondary | ICD-10-CM | POA: Diagnosis not present

## 2019-07-12 DIAGNOSIS — J3089 Other allergic rhinitis: Secondary | ICD-10-CM | POA: Diagnosis not present

## 2019-07-12 DIAGNOSIS — J301 Allergic rhinitis due to pollen: Secondary | ICD-10-CM | POA: Diagnosis not present

## 2019-07-14 ENCOUNTER — Other Ambulatory Visit: Payer: Self-pay

## 2019-07-14 ENCOUNTER — Encounter: Payer: Self-pay | Admitting: Family Medicine

## 2019-07-14 ENCOUNTER — Ambulatory Visit (INDEPENDENT_AMBULATORY_CARE_PROVIDER_SITE_OTHER): Payer: BC Managed Care – PPO | Admitting: Family Medicine

## 2019-07-14 VITALS — BP 104/68 | Ht 70.0 in | Wt 145.0 lb

## 2019-07-14 DIAGNOSIS — M79672 Pain in left foot: Secondary | ICD-10-CM

## 2019-07-14 DIAGNOSIS — M76899 Other specified enthesopathies of unspecified lower limb, excluding foot: Secondary | ICD-10-CM | POA: Diagnosis not present

## 2019-07-14 NOTE — Patient Instructions (Signed)
Continue the nitro patches for 2 more weeks. Add theraband strengthening exercises of your ankle - external rotation - as we discussed. Continue with your balance exercises. Consider custom orthotics as next step if we were to do something here. You can try spencos, superfeet inserts or the Hapad sports insoles with a small scaphoid pad as other options that aren't the custom route. Call me if you have any questions otherwise follow up with me as needed (or if you want to do the custom orthotics).

## 2019-07-14 NOTE — Progress Notes (Signed)
HPI  3/31: Patient is a 64 yo male here for follow up of left quadriceps tendinitis and left heel/lateral foot pain. He states that it has improved significantly improved from his last visit. He has decreased running to just twice a week and had a period of rest from running for 8 days but has continued to cross train and weight lift. He has been icing his left quad tendon consistently and been using the nitro patches daily, as well as a theragun which he states to be helping. He was able to run 12 miles yesterday without pain during or after.   His left heel/lateral foot pain has improved slightly. He has been performing plantar fasciitis exercises and recently started using a stability disc to start strengthening the lower leg muscles and has noticed a difference with this.   06/07/19  Left knee pain 64 year old male here for anterior, proximal left knee pain after 1 month.  Noticed after he was running on a treadmill.  Previously ran about 65 miles per week but has cut back since to about 35 miles a week.  States that in the last 3 weeks he has only run 3 times per week with 1 to 2 days off in between.  States that so far the pain has not improved.  Patient took 3 days off this past week after doing the elliptical bike which did not hurt and then chose to run 5 miles.  After 1 mile he felt the pain and it continuously got worse during the run.  He however did not need to stop.  Never had any pain like this before.  He denies any numbness, tingling.  He denies any trauma to his knee.  Left heel pain States he has had this plantar heel pain on and off for a couple of years.  States that it is been present this episode for 1 month.  Reports that the pain is the same as before.  He has been trying rolling exercises with his foot as well as stretches that he was previously given for treatment which has not helped.  He denies any new trauma.  He denies any numbness or tingling.  Left lateral foot  pain Patient reports that he has had pain over his left lateral foot for the past couple of weeks.  He states that when he runs it worsens.  He states that the pain will start on the lateral aspect under his ankle and then will sometimes radiate over the top of his foot.  He denies any pain like this before.  Denies any numbness or tingling.  States he still has full range of motion.  See HPI and/or previous note for associated ROS.  Objective: BP 104/68   Ht 5\' 10"  (1.778 m)   Wt 145 lb (65.8 kg)   BMI 20.81 kg/m  Gen: NAD, comfortable in exam room.  L Knee: No deformity or discoloration, no joint line tenderness or TTP of quad or patellar tendons, Sensation intact FROM with extension and flexion Strength 5/5 Varus/Valgus stress: Neg Ant drawer/Lachman: Neg Post drawer: Neg McMurray: Neg  L Ankle/Foot: No deformity or discoloration, TTP over peroneus brevis and plantar heel.  FROM, Strength 5/5 with Dorsiflex/Plantarflex/Evers/Inversion Mild pain with eversion Calcaneal squeeze: Neg No tenderness over lateral/medial malleoli No tenderness over achilles tendon  Assessment and plan: 1. Quadriceps tendinitis: Improving with current management of ice, Nitro patches and patient's theragun. Encouraged him to continue with single leg exercises and if the pain  returns to take NSAIDs prn.  Continue nitro for 2 more weeks.  2. Left heel/foot pain - Likely due to fat pad atrophy and peroneal tendonitis, encouraged patient to get orthotics or store bought insoles. Encouraged him to continue plantar fasciitis exercises.

## 2019-07-19 DIAGNOSIS — J3089 Other allergic rhinitis: Secondary | ICD-10-CM | POA: Diagnosis not present

## 2019-07-19 DIAGNOSIS — J301 Allergic rhinitis due to pollen: Secondary | ICD-10-CM | POA: Diagnosis not present

## 2019-07-20 ENCOUNTER — Encounter: Payer: Self-pay | Admitting: Internal Medicine

## 2019-07-20 ENCOUNTER — Other Ambulatory Visit: Payer: Self-pay

## 2019-07-20 ENCOUNTER — Ambulatory Visit (INDEPENDENT_AMBULATORY_CARE_PROVIDER_SITE_OTHER): Payer: BC Managed Care – PPO | Admitting: Internal Medicine

## 2019-07-20 VITALS — BP 138/76 | HR 82 | Temp 98.2°F | Ht 70.0 in | Wt 150.0 lb

## 2019-07-20 DIAGNOSIS — E559 Vitamin D deficiency, unspecified: Secondary | ICD-10-CM | POA: Diagnosis not present

## 2019-07-20 DIAGNOSIS — E538 Deficiency of other specified B group vitamins: Secondary | ICD-10-CM

## 2019-07-20 DIAGNOSIS — Z0000 Encounter for general adult medical examination without abnormal findings: Secondary | ICD-10-CM

## 2019-07-20 DIAGNOSIS — Z125 Encounter for screening for malignant neoplasm of prostate: Secondary | ICD-10-CM

## 2019-07-20 DIAGNOSIS — R5383 Other fatigue: Secondary | ICD-10-CM | POA: Diagnosis not present

## 2019-07-20 DIAGNOSIS — E611 Iron deficiency: Secondary | ICD-10-CM

## 2019-07-20 LAB — HEPATIC FUNCTION PANEL
ALT: 37 U/L (ref 0–53)
AST: 49 U/L — ABNORMAL HIGH (ref 0–37)
Albumin: 4.4 g/dL (ref 3.5–5.2)
Alkaline Phosphatase: 55 U/L (ref 39–117)
Bilirubin, Direct: 0.1 mg/dL (ref 0.0–0.3)
Total Bilirubin: 0.6 mg/dL (ref 0.2–1.2)
Total Protein: 6.7 g/dL (ref 6.0–8.3)

## 2019-07-20 LAB — CBC WITH DIFFERENTIAL/PLATELET
Basophils Absolute: 0.1 10*3/uL (ref 0.0–0.1)
Basophils Relative: 1.2 % (ref 0.0–3.0)
Eosinophils Absolute: 0.1 10*3/uL (ref 0.0–0.7)
Eosinophils Relative: 1.9 % (ref 0.0–5.0)
HCT: 44.8 % (ref 39.0–52.0)
Hemoglobin: 15.3 g/dL (ref 13.0–17.0)
Lymphocytes Relative: 22.7 % (ref 12.0–46.0)
Lymphs Abs: 1.2 10*3/uL (ref 0.7–4.0)
MCHC: 34.2 g/dL (ref 30.0–36.0)
MCV: 89.3 fl (ref 78.0–100.0)
Monocytes Absolute: 0.5 10*3/uL (ref 0.1–1.0)
Monocytes Relative: 8.9 % (ref 3.0–12.0)
Neutro Abs: 3.5 10*3/uL (ref 1.4–7.7)
Neutrophils Relative %: 65.3 % (ref 43.0–77.0)
Platelets: 251 10*3/uL (ref 150.0–400.0)
RBC: 5.02 Mil/uL (ref 4.22–5.81)
RDW: 13.2 % (ref 11.5–15.5)
WBC: 5.3 10*3/uL (ref 4.0–10.5)

## 2019-07-20 LAB — BASIC METABOLIC PANEL
BUN: 19 mg/dL (ref 6–23)
CO2: 33 mEq/L — ABNORMAL HIGH (ref 19–32)
Calcium: 9.3 mg/dL (ref 8.4–10.5)
Chloride: 101 mEq/L (ref 96–112)
Creatinine, Ser: 1.16 mg/dL (ref 0.40–1.50)
GFR: 63.9 mL/min (ref >60.00)
Glucose, Bld: 88 mg/dL (ref 70–99)
Potassium: 4.3 mEq/L (ref 3.5–5.1)
Sodium: 139 mEq/L (ref 135–145)

## 2019-07-20 LAB — URINALYSIS, ROUTINE W REFLEX MICROSCOPIC
Bilirubin Urine: NEGATIVE
Hgb urine dipstick: NEGATIVE
Ketones, ur: NEGATIVE
Leukocytes,Ua: NEGATIVE
Nitrite: NEGATIVE
RBC / HPF: NONE SEEN (ref 0–?)
Specific Gravity, Urine: 1.02 (ref 1.000–1.030)
Total Protein, Urine: NEGATIVE
Urine Glucose: NEGATIVE
Urobilinogen, UA: 0.2 (ref 0.0–1.0)
pH: 7 (ref 5.0–8.0)

## 2019-07-20 LAB — IBC PANEL
Iron: 85 ug/dL (ref 42–165)
Saturation Ratios: 22.2 % (ref 20.0–50.0)
Transferrin: 274 mg/dL (ref 212.0–360.0)

## 2019-07-20 LAB — LIPID PANEL
Cholesterol: 160 mg/dL (ref 0–200)
HDL: 55.1 mg/dL (ref >39.00)
LDL Cholesterol: 90 mg/dL (ref 0–99)
NonHDL: 104.4
Total CHOL/HDL Ratio: 3
Triglycerides: 74 mg/dL (ref 0.0–149.0)
VLDL: 14.8 mg/dL (ref 0.0–40.0)

## 2019-07-20 LAB — PSA: PSA: 1.12 ng/mL (ref 0.10–4.00)

## 2019-07-20 LAB — VITAMIN B12: Vitamin B-12: >1500 pg/mL — ABNORMAL HIGH (ref 211–911)

## 2019-07-20 LAB — CORTISOL: Cortisol, Plasma: 9.1 ug/dL

## 2019-07-20 LAB — TSH: TSH: 3.67 u[IU]/mL (ref 0.35–4.50)

## 2019-07-20 LAB — VITAMIN D 25 HYDROXY (VIT D DEFICIENCY, FRACTURES): VITD: 75.12 ng/mL (ref 30.00–100.00)

## 2019-07-20 NOTE — Patient Instructions (Signed)

## 2019-07-20 NOTE — Progress Notes (Signed)
Subjective:    Patient ID: Joseph Santana, male    DOB: 29-Jan-1956, 64 y.o.   MRN: [id number]  HPI  Here for wellness and f/u;  Overall doing ok;  Pt denies Chest pain, worsening SOB, DOE, wheezing, orthopnea, PND, worsening LE edema, palpitations, dizziness or syncope.  Pt denies neurological change such as new headache, facial or extremity weakness.  Pt denies polydipsia, polyuria, or low sugar symptoms. Pt states overall good compliance with treatment and medications, good tolerability, and has been trying to follow appropriate diet.  Pt denies worsening depressive symptoms, suicidal ideation or panic. No fever, night sweats, wt loss, loss of appetite, or other constitutional symptoms.  Pt states good ability with ADL's, has low fall risk, home safety reviewed and adequate, no other significant changes in hearing or vision, and only occasionally active with exercise. Past Medical History:  Diagnosis Date  . ALLERGIC REACTION 07/22/2009  . ALLERGIC RHINITIS 10/08/2006  . B12 DEFICIENCY 05/14/2010  . Carpal tunnel syndrome 12/31/2007  . Degeneration of cervical intervertebral disc 12/31/2007  . HYPERLIPIDEMIA 10/08/2006  . HYPOTHYROIDISM 10/08/2006  . Irritable bowel syndrome 03/03/2008  . LUMBAR RADICULOPATHY, RIGHT 12/31/2006  . OSTEOARTHRITIS, LOWER LEG, LEFT 05/10/2009  . SINUSITIS - ACUTE-NOS 07/22/2009  . TESTICULAR HYPOFUNCTION 06/26/2009   Past Surgical History:  Procedure Laterality Date  . KNEE SURGERY    . LUMBAR LAMINECTOMY  2008   with revision  . SPINAL FUSION  2011  . spinal fusion revision  2012    reports that he has never smoked. He has never used smokeless tobacco. He reports that he does not drink alcohol or use drugs. family history includes Allergy (severe) in an other family member; Asthma in an other family member; Diabetes in an other family member; Hypothyroidism in his brother, father, mother, and sister. Allergies  Allergen Reactions  . Iodine   . Levofloxacin     Current Outpatient Medications on File Prior to Visit  Medication Sig Dispense Refill  . EPINEPHrine (EPIPEN 2-PAK) 0.3 mg/0.3 mL IJ SOAJ injection EpiPen 2-Pak 0.3 mg/0.3 mL injection, auto-injector    . fluticasone (FLONASE) 50 MCG/ACT nasal spray SPRAY 1 SPRAY INTO EACH NOSTRIL EVERY DAY  5  . levothyroxine (SYNTHROID) 150 MCG tablet TAKE 1 TABLET BY MOUTH 30 MINUTES BEFORE BREAKFAST EVERY DAY 90 tablet 0  . montelukast (SINGULAIR) 10 MG tablet Take 10 mg by mouth at bedtime.    . Multiple Vitamin (MULTIVITAMIN) capsule Take 1 capsule by mouth daily.      . nitroGLYCERIN (NITRODUR - DOSED IN MG/24 HR) 0.2 mg/hr patch Place 1/4 patch to the affected area daily. 30 patch 1  . Olopatadine HCl 0.6 % SOLN     . Testosterone Undecanoate (JATENZO) 158 MG CAPS Take 1 capsule by mouth 2 (two) times daily. 60 capsule 1  . clobetasol (OLUX) 0.05 % topical foam clobetasol 0.05 % topical foam    . loratadine (CLARITIN) 10 MG tablet Take 10 mg by mouth daily.     No current facility-administered medications on file prior to visit.   Review of Systems All otherwise neg per pt     Objective:   Physical Exam BP 138/76   Pulse 82   Temp 98.2 F (36.8 C)   Ht 5\' 10"  (1.778 m)   Wt 150 lb (68 kg)   SpO2 100%   BMI 21.52 kg/m  VS noted,  Constitutional: Pt appears in NAD HENT: Head: NCAT.  Right Ear: External ear normal.  Left Ear: External ear normal.  Eyes: . Pupils are equal, round, and reactive to light. Conjunctivae and EOM are normal Nose: without d/c or deformity Neck: Neck supple. Gross normal ROM Cardiovascular: Normal rate and regular rhythm.   Pulmonary/Chest: Effort normal and breath sounds without rales or wheezing.  Abd:  Soft, NT, ND, + BS, no organomegaly Neurological: Pt is alert. At baseline orientation, motor grossly intact Skin: Skin is warm. No rashes, other new lesions, no LE edema Psychiatric: Pt behavior is normal without agitation  Lab Results  Component Value  Date   WBC 5.3 07/20/2019   HGB 15.3 07/20/2019   HCT 44.8 07/20/2019   PLT 251.0 07/20/2019   GLUCOSE 88 07/20/2019   CHOL 160 07/20/2019   TRIG 74.0 07/20/2019   HDL 55.10 07/20/2019   LDLDIRECT 146.2 11/19/2012   LDLCALC 90 07/20/2019   ALT 37 07/20/2019   AST 49 (H) 07/20/2019   NA 139 07/20/2019   K 4.3 07/20/2019   CL 101 07/20/2019   CREATININE 1.16 07/20/2019   BUN 19 07/20/2019   CO2 33 (H) 07/20/2019   TSH 3.67 07/20/2019   PSA 1.12 07/20/2019          Assessment & Plan:

## 2019-07-21 ENCOUNTER — Encounter: Payer: Self-pay | Admitting: Internal Medicine

## 2019-07-21 NOTE — Assessment & Plan Note (Signed)
Also for cortisol per pt request

## 2019-07-21 NOTE — Assessment & Plan Note (Signed)

## 2019-07-22 DIAGNOSIS — J301 Allergic rhinitis due to pollen: Secondary | ICD-10-CM | POA: Diagnosis not present

## 2019-07-23 ENCOUNTER — Ambulatory Visit: Payer: BC Managed Care – PPO | Admitting: Endocrinology

## 2019-07-23 DIAGNOSIS — J3089 Other allergic rhinitis: Secondary | ICD-10-CM | POA: Diagnosis not present

## 2019-07-26 DIAGNOSIS — J3089 Other allergic rhinitis: Secondary | ICD-10-CM | POA: Diagnosis not present

## 2019-07-26 DIAGNOSIS — J301 Allergic rhinitis due to pollen: Secondary | ICD-10-CM | POA: Diagnosis not present

## 2019-07-27 ENCOUNTER — Other Ambulatory Visit: Payer: Self-pay

## 2019-07-27 ENCOUNTER — Other Ambulatory Visit: Payer: Self-pay | Admitting: Endocrinology

## 2019-07-27 DIAGNOSIS — E230 Hypopituitarism: Secondary | ICD-10-CM

## 2019-07-28 ENCOUNTER — Other Ambulatory Visit (INDEPENDENT_AMBULATORY_CARE_PROVIDER_SITE_OTHER): Payer: BC Managed Care – PPO

## 2019-07-28 ENCOUNTER — Other Ambulatory Visit: Payer: Self-pay

## 2019-07-28 DIAGNOSIS — E230 Hypopituitarism: Secondary | ICD-10-CM

## 2019-07-28 LAB — TESTOSTERONE: Testosterone: 554.7 ng/dL (ref 300.00–890.00)

## 2019-07-29 NOTE — Progress Notes (Signed)
Patient ID: Joseph Santana, male   DOB: 03-25-1956, 64 y.o.   MRN: [id number]            Reason for Appointment:  Follow-up of low testosterone    Today's office visit was provided via telemedicine using video technique Explained to the patient and the the limitations of evaluation and management by telemedicine and the availability of in person appointments.  The patient understood the limitations and agreed to proceed. Patient also understood that the telehealth visit is billable. . Location of the patient: Home . Location of the provider: Office Only the patient and myself were participating in the encounter     History of Present Illness:   HYPOGONADISM: Prior history: He was diagnosed to have a low testosterone level in 2012 or so when he was coming to his physician with complaints of increased fatigue.  Not clear what his baseline testosterone level was but he had a level of about 130 in 2013 around the time when he was having back surgery No record of detail evaluation with pituitary function available He thinks he was tried on AndroGel initially but because of skin irritation he was changed to testosterone injections.  He may have been prescribed Axiron also but does not remember this He thinks he had significantly better with taking testosterone injections, apparently was taking about 100 mg every 2 weeks He stopped taking the injections 6-8 months prior to his initial consultation  as they were not renewed and he had a change in his PCP  RECENT history: On his initial consultation he was complaining of fatigue  Baseline testosterone was 208 with LH of 2.1 and normal prolactin, free testosterone not done by the lab  He had been on a trial of clomiphene half tablet, initially 3 times a week since 01/11/16. With this he had more energy after starting treatment With this his testosterone level came back to normal until 2/18 when it was 494, subsequently had been progressively  declining and was 273 done in 1/19 This was despite taking 50 mg 5 days a week; with this he was complaining of more fatigue and sluggishness and decreased motivation   Also complaining of decreased libido  RECENT history: He was subsequently taking Fortesta gel but this was changed because of redness, irritation and sunburn-like reaction on the skin Because of the skin irritation he was changed from 1 pump on each thigh  to the Axiron preparation, 1 pump under each arm  His testosterone level had fluctuated with the Axiron He was regular with using 1 application dose under each arm daily On the last visit he stated that he had been able to apply it properly and it does not usually drip down his chest from his underarm Testosterone level went down to 193 in 2/21 compared to 591  He has been able to get [place] approved through his insurance Although right after starting this his testosterone level was about 330 at that time he was taking 1 dose at 11 PM and another at 7 AM on his own With continued treatment his testosterone level done around noon is now up to 555  He had been feeling somewhat tired at times but more recently has been feeling generally better with his energy level, previously also dealing with sinus infections  His hemoglobin and also last PSA have been quite normal   He has had the following testosterone levels recently  Lab Results  Component Value Date   TESTOSTERONE 554.70 07/28/2019  TESTOSTERONE 329.83 06/29/2019   TESTOSTERONE 192.55 (L) 06/02/2019   TESTOSTERONE 591.37 12/07/2018    Lab Results  Component Value Date   LH 3.39 03/03/2017   LH 2.94 02/28/2016   LH 2.10 01/10/2016   Lab Results  Component Value Date   HGB 15.3 07/20/2019   Hypothyroidism was first diagnosed  about 30 years ago  At the time of diagnosis patient was having symptoms of  fatigue but he is not sure about all the other symptoms Most probably tested also because of strong  family history of thyroid disease  This has been followed by PCP  He takes his thyroid supplement regularly in the morning  He has now been on levothyroxine 150 mcg daily and he has been on a stable dose for some time         Patient's weight history is as follows:  Wt Readings from Last 3 Encounters:  07/20/19 150 lb (68 kg)  07/14/19 145 lb (65.8 kg)  06/07/19 145 lb (65.8 kg)    Thyroid function results have been as follows:  Lab Results  Component Value Date   TSH 3.67 07/20/2019   TSH 2.74 06/29/2019   TSH 2.72 06/09/2018   FREET4 1.04 06/29/2019   FREET4 1.28 01/07/2018   FREET4 1.21 12/02/2016      Past Medical History:  Diagnosis Date  . ALLERGIC REACTION 07/22/2009  . ALLERGIC RHINITIS 10/08/2006  . B12 DEFICIENCY 05/14/2010  . Carpal tunnel syndrome 12/31/2007  . Degeneration of cervical intervertebral disc 12/31/2007  . HYPERLIPIDEMIA 10/08/2006  . HYPOTHYROIDISM 10/08/2006  . Irritable bowel syndrome 03/03/2008  . LUMBAR RADICULOPATHY, RIGHT 12/31/2006  . OSTEOARTHRITIS, LOWER LEG, LEFT 05/10/2009  . SINUSITIS - ACUTE-NOS 07/22/2009  . TESTICULAR HYPOFUNCTION 06/26/2009    Past Surgical History:  Procedure Laterality Date  . KNEE SURGERY    . LUMBAR LAMINECTOMY  2008   with revision  . SPINAL FUSION  2011  . spinal fusion revision  2012    Family History  Problem Relation Age of Onset  . Hypothyroidism Father   . Asthma Other   . Diabetes Other   . Allergy (severe) Other   . Hypothyroidism Mother   . Hypothyroidism Sister   . Hypothyroidism Brother     Social History:  reports that he has never smoked. He has never used smokeless tobacco. He reports that he does not drink alcohol or use drugs.  Allergies:  Allergies  Allergen Reactions  . Iodine   . Levofloxacin     Allergies as of 07/30/2019      Reactions   Iodine    Levofloxacin       Medication List       Accurate as of July 29, 2019  4:55 PM. If you have any questions, ask your  nurse or doctor.        clobetasol 0.05 % topical foam Commonly known as: OLUX clobetasol 0.05 % topical foam   EpiPen 2-Pak 0.3 mg/0.3 mL Soaj injection Generic drug: EPINEPHrine EpiPen 2-Pak 0.3 mg/0.3 mL injection, auto-injector   fluticasone 50 MCG/ACT nasal spray Commonly known as: FLONASE SPRAY 1 SPRAY INTO EACH NOSTRIL EVERY DAY   Jatenzo 158 MG Caps Generic drug: Testosterone Undecanoate Take 1 capsule by mouth 2 (two) times daily.   levothyroxine 150 MCG tablet Commonly known as: SYNTHROID TAKE 1 TABLET BY MOUTH 30 MINUTES BEFORE BREAKFAST EVERY DAY   loratadine 10 MG tablet Commonly known as: CLARITIN Take 10 mg by mouth daily.  montelukast 10 MG tablet Commonly known as: SINGULAIR Take 10 mg by mouth at bedtime.   multivitamin capsule Take 1 capsule by mouth daily.   nitroGLYCERIN 0.2 mg/hr patch Commonly known as: NITRODUR - Dosed in mg/24 hr Place 1/4 patch to the affected area daily.   Olopatadine HCl 0.6 % Soln       Review of Systems  He has had chronic insomnia        Examination:    There were no vitals taken for this visit.    Assessment:  HYPOGONADISM:   He has hypogonadotropic hypogonadism with baseline testosterone of 208 and has been symptomatic He is now taking Jatenzo 158 mg twice daily Although initially his testosterone level was only 329 it is now over 500 He has recently started feeling fairly good with his energy level Also he is able to get this at a $0 co-pay currently  HYPOTHYROIDISM, TSH normal in 07/2019    PLAN:  He will continue on 158 mg twice daily of Jatenzo Discussed timing of taking the medication to be at 7 AM and late afternoon and does not need to take it late at night Since he is subjectively doing well and levels are over 500 he will continue the same dose Labs will need to be drawn at about noon for testosterone level  He will follow-up in about 6 months  Continue same dose of thyroid  supplement   Reather Littler 07/29/2019, 4:55 PM

## 2019-07-30 ENCOUNTER — Encounter: Payer: Self-pay | Admitting: Endocrinology

## 2019-07-30 ENCOUNTER — Telehealth (INDEPENDENT_AMBULATORY_CARE_PROVIDER_SITE_OTHER): Payer: BC Managed Care – PPO | Admitting: Endocrinology

## 2019-07-30 ENCOUNTER — Other Ambulatory Visit: Payer: Self-pay

## 2019-07-30 DIAGNOSIS — E230 Hypopituitarism: Secondary | ICD-10-CM

## 2019-08-09 DIAGNOSIS — J301 Allergic rhinitis due to pollen: Secondary | ICD-10-CM | POA: Diagnosis not present

## 2019-08-09 DIAGNOSIS — J3089 Other allergic rhinitis: Secondary | ICD-10-CM | POA: Diagnosis not present

## 2019-08-13 ENCOUNTER — Telehealth: Payer: Self-pay | Admitting: Endocrinology

## 2019-08-13 ENCOUNTER — Other Ambulatory Visit: Payer: Self-pay | Admitting: Endocrinology

## 2019-08-13 MED ORDER — JATENZO 158 MG PO CAPS: 1.0000 | ORAL_CAPSULE | Freq: Two times a day (BID) | ORAL | 3 refills | Status: DC

## 2019-08-13 NOTE — Telephone Encounter (Signed)
Please refill if appropriate

## 2019-08-13 NOTE — Telephone Encounter (Signed)
Medication Refill Request  Did you call your pharmacy and request this refill first? Yes  . If patient has not contacted pharmacy first, instruct them to do so for future refills.  . Remind them that contacting the pharmacy for their refill is the quickest method to get the refill.  . Refill policy also stated that it will take anywhere between 24-72 hours to receive the refill.    Name of medication? Joseph Santana (patient taking last dose today)  Is this a 90 day supply? No   Name and location [facility] on [address] and [place]

## 2019-08-16 DIAGNOSIS — J3089 Other allergic rhinitis: Secondary | ICD-10-CM | POA: Diagnosis not present

## 2019-08-16 DIAGNOSIS — J301 Allergic rhinitis due to pollen: Secondary | ICD-10-CM | POA: Diagnosis not present

## 2019-08-23 DIAGNOSIS — J301 Allergic rhinitis due to pollen: Secondary | ICD-10-CM | POA: Diagnosis not present

## 2019-08-23 DIAGNOSIS — J3081 Allergic rhinitis due to animal (cat) (dog) hair and dander: Secondary | ICD-10-CM | POA: Diagnosis not present

## 2019-08-23 DIAGNOSIS — J3089 Other allergic rhinitis: Secondary | ICD-10-CM | POA: Diagnosis not present

## 2019-09-06 DIAGNOSIS — J301 Allergic rhinitis due to pollen: Secondary | ICD-10-CM | POA: Diagnosis not present

## 2019-09-06 DIAGNOSIS — J3089 Other allergic rhinitis: Secondary | ICD-10-CM | POA: Diagnosis not present

## 2019-09-08 DIAGNOSIS — J0100 Acute maxillary sinusitis, unspecified: Secondary | ICD-10-CM | POA: Diagnosis not present

## 2019-09-08 DIAGNOSIS — Z91013 Allergy to seafood: Secondary | ICD-10-CM | POA: Diagnosis not present

## 2019-09-08 DIAGNOSIS — J301 Allergic rhinitis due to pollen: Secondary | ICD-10-CM | POA: Diagnosis not present

## 2019-09-08 DIAGNOSIS — J3089 Other allergic rhinitis: Secondary | ICD-10-CM | POA: Diagnosis not present

## 2019-09-14 DIAGNOSIS — J301 Allergic rhinitis due to pollen: Secondary | ICD-10-CM | POA: Diagnosis not present

## 2019-09-14 DIAGNOSIS — J3089 Other allergic rhinitis: Secondary | ICD-10-CM | POA: Diagnosis not present

## 2019-09-20 DIAGNOSIS — J301 Allergic rhinitis due to pollen: Secondary | ICD-10-CM | POA: Diagnosis not present

## 2019-09-20 DIAGNOSIS — J3089 Other allergic rhinitis: Secondary | ICD-10-CM | POA: Diagnosis not present

## 2019-09-27 DIAGNOSIS — J3089 Other allergic rhinitis: Secondary | ICD-10-CM | POA: Diagnosis not present

## 2019-09-27 DIAGNOSIS — J301 Allergic rhinitis due to pollen: Secondary | ICD-10-CM | POA: Diagnosis not present

## 2019-09-29 DIAGNOSIS — H2513 Age-related nuclear cataract, bilateral: Secondary | ICD-10-CM | POA: Diagnosis not present

## 2019-09-29 DIAGNOSIS — H52203 Unspecified astigmatism, bilateral: Secondary | ICD-10-CM | POA: Diagnosis not present

## 2019-09-29 DIAGNOSIS — H5213 Myopia, bilateral: Secondary | ICD-10-CM | POA: Diagnosis not present

## 2019-09-29 DIAGNOSIS — H524 Presbyopia: Secondary | ICD-10-CM | POA: Diagnosis not present

## 2019-10-04 DIAGNOSIS — J3089 Other allergic rhinitis: Secondary | ICD-10-CM | POA: Diagnosis not present

## 2019-10-04 DIAGNOSIS — J301 Allergic rhinitis due to pollen: Secondary | ICD-10-CM | POA: Diagnosis not present

## 2019-10-11 DIAGNOSIS — J3089 Other allergic rhinitis: Secondary | ICD-10-CM | POA: Diagnosis not present

## 2019-10-11 DIAGNOSIS — J301 Allergic rhinitis due to pollen: Secondary | ICD-10-CM | POA: Diagnosis not present

## 2019-10-20 ENCOUNTER — Telehealth: Payer: Self-pay | Admitting: Internal Medicine

## 2019-10-20 DIAGNOSIS — J3089 Other allergic rhinitis: Secondary | ICD-10-CM | POA: Diagnosis not present

## 2019-10-20 DIAGNOSIS — Z91013 Allergy to seafood: Secondary | ICD-10-CM | POA: Diagnosis not present

## 2019-10-20 DIAGNOSIS — T63441D Toxic effect of venom of bees, accidental (unintentional), subsequent encounter: Secondary | ICD-10-CM | POA: Diagnosis not present

## 2019-10-20 DIAGNOSIS — J301 Allergic rhinitis due to pollen: Secondary | ICD-10-CM | POA: Diagnosis not present

## 2019-10-20 NOTE — Telephone Encounter (Signed)
I dont think I have visits open, ok for any provider, thanks

## 2019-10-20 NOTE — Telephone Encounter (Signed)
New message:   LVM for pt to call back and schedule an appt for tomorrow with Dr. Jonny Ruiz. Please advise.

## 2019-10-20 NOTE — Telephone Encounter (Signed)
Sent to Dr. John. 

## 2019-10-20 NOTE — Telephone Encounter (Signed)
New message:   Pt is calling and states he got stung by 4 wasp and he is allergic to them. Pt would like to come in and see if he can get a Prednisone shot today. Please advise.

## 2019-10-25 DIAGNOSIS — J3089 Other allergic rhinitis: Secondary | ICD-10-CM | POA: Diagnosis not present

## 2019-10-25 DIAGNOSIS — J301 Allergic rhinitis due to pollen: Secondary | ICD-10-CM | POA: Diagnosis not present

## 2019-10-26 ENCOUNTER — Telehealth: Payer: Self-pay | Admitting: Internal Medicine

## 2019-10-26 NOTE — Telephone Encounter (Signed)
Patient dropped off a biometric screening form to be completed.  Last CPE : 07/20/19 Form has been completed &Signed, Copy sent to scan.   Patient picked up original & drop off at employer.

## 2019-10-27 ENCOUNTER — Ambulatory Visit: Payer: Self-pay

## 2019-10-27 ENCOUNTER — Ambulatory Visit
Admission: RE | Admit: 2019-10-27 | Discharge: 2019-10-27 | Disposition: A | Discharge status: Home / Self Care | Payer: BC Managed Care – PPO | Source: Ambulatory Visit | Attending: Family Medicine | Admitting: Family Medicine

## 2019-10-27 ENCOUNTER — Ambulatory Visit (INDEPENDENT_AMBULATORY_CARE_PROVIDER_SITE_OTHER): Payer: BC Managed Care – PPO | Admitting: Family Medicine

## 2019-10-27 ENCOUNTER — Other Ambulatory Visit: Payer: Self-pay

## 2019-10-27 ENCOUNTER — Encounter: Payer: Self-pay | Admitting: Family Medicine

## 2019-10-27 VITALS — BP 122/78 | Ht 70.0 in | Wt 150.0 lb

## 2019-10-27 DIAGNOSIS — M79672 Pain in left foot: Secondary | ICD-10-CM

## 2019-10-27 NOTE — Progress Notes (Signed)
PCP: Corwin Levins, MD  Subjective:   HPI: Patient is a 64 y.o. male here for left foot pain.  3/31: Left lateral foot pain Patient reports that he has had pain over his left lateral foot for the past couple of weeks.  He states that when he runs it worsens.  He states that the pain will start on the lateral aspect under his ankle and then will sometimes radiate over the top of his foot.  He denies any pain like this before.  Denies any numbness or tingling.  States he still has full range of motion.  7/14: Patient reports he's continued to have left lateral foot pain since last visit. Has really worsened since increasing mileage from about 45 miles/week to 60 miles a week including intervals as he's training for marathons in the fall. Pain was felt some after running, started to hurt during run, and at present hurts even with walking. Radiation up lateral lower leg. Heel pain has resolved. No swelling or bruising.  Past Medical History:  Diagnosis Date  . ALLERGIC REACTION 07/22/2009  . ALLERGIC RHINITIS 10/08/2006  . B12 DEFICIENCY 05/14/2010  . Carpal tunnel syndrome 12/31/2007  . Degeneration of cervical intervertebral disc 12/31/2007  . HYPERLIPIDEMIA 10/08/2006  . HYPOTHYROIDISM 10/08/2006  . Irritable bowel syndrome 03/03/2008  . LUMBAR RADICULOPATHY, RIGHT 12/31/2006  . OSTEOARTHRITIS, LOWER LEG, LEFT 05/10/2009  . SINUSITIS - ACUTE-NOS 07/22/2009  . TESTICULAR HYPOFUNCTION 06/26/2009    Current Outpatient Medications on File Prior to Visit  Medication Sig Dispense Refill  . clobetasol (OLUX) 0.05 % topical foam clobetasol 0.05 % topical foam    . EPINEPHrine (EPIPEN 2-PAK) 0.3 mg/0.3 mL IJ SOAJ injection EpiPen 2-Pak 0.3 mg/0.3 mL injection, auto-injector    . fluticasone (FLONASE) 50 MCG/ACT nasal spray SPRAY 1 SPRAY INTO EACH NOSTRIL EVERY DAY  5  . levothyroxine (SYNTHROID) 150 MCG tablet TAKE 1 TABLET BY MOUTH 30 MINUTES BEFORE BREAKFAST EVERY DAY 90 tablet 0  . loratadine  (CLARITIN) 10 MG tablet Take 10 mg by mouth daily.    . montelukast (SINGULAIR) 10 MG tablet Take 10 mg by mouth at bedtime.    . Multiple Vitamin (MULTIVITAMIN) capsule Take 1 capsule by mouth daily.      . nitroGLYCERIN (NITRODUR - DOSED IN MG/24 HR) 0.2 mg/hr patch Place 1/4 patch to the affected area daily. 30 patch 1  . Olopatadine HCl 0.6 % SOLN     . Testosterone Undecanoate (JATENZO) 158 MG CAPS Take 1 capsule by mouth 2 (two) times daily. 60 capsule 3   No current facility-administered medications on file prior to visit.    Past Surgical History:  Procedure Laterality Date  . KNEE SURGERY    . LUMBAR LAMINECTOMY  2008   with revision  . SPINAL FUSION  2011  . spinal fusion revision  2012    Allergies  Allergen Reactions  . Iodine   . Levofloxacin     Social History   Socioeconomic History  . Marital status: Married    Spouse name: Not on file  . Number of children: Not on file  . Years of education: Not on file  . Highest education level: Not on file  Occupational History  . Not on file  Tobacco Use  . Smoking status: Never Smoker  . Smokeless tobacco: Never Used  Substance and Sexual Activity  . Alcohol use: No  . Drug use: No  . Sexual activity: Yes  Other Topics Concern  . Not  on file  Social History Narrative  . Not on file   Social Determinants of Health   Financial Resource Strain:   . Difficulty of Paying Living Expenses:   Food Insecurity:   . Worried About Programme researcher, broadcasting/film/video in the Last Year:   . Barista in the Last Year:   Transportation Needs:   . Freight forwarder (Medical):   Marland Kitchen Lack of Transportation (Non-Medical):   Physical Activity:   . Days of Exercise per Week:   . Minutes of Exercise per Session:   Stress:   . Feeling of Stress :   Social Connections:   . Frequency of Communication with Friends and Family:   . Frequency of Social Gatherings with Friends and Family:   . Attends Religious Services:   . Active  Member of Clubs or Organizations:   . Attends Banker Meetings:   Marland Kitchen Marital Status:   Intimate Partner Violence:   . Fear of Current or Ex-Partner:   . Emotionally Abused:   Marland Kitchen Physically Abused:   . Sexually Abused:     Family History  Problem Relation Age of Onset  . Hypothyroidism Father   . Asthma Other   . Diabetes Other   . Allergy (severe) Other   . Hypothyroidism Mother   . Hypothyroidism Sister   . Hypothyroidism Brother     BP 122/78   Ht 5\' 10"  (1.778 m)   Wt 150 lb (68 kg)   BMI 21.52 kg/m   Review of Systems: See HPI above.     Objective:  Physical Exam:  Gen: NAD, comfortable in exam room  Left foot/ankle: No gross deformity, swelling, ecchymoses FROM without pain on resisted motions.  5/5 strength all motions. TTP base 5th metatarsal through course of distal peroneus brevis lateral foot/ankle.  No other tenderness. Negative ant drawer and talar tilt.   Negative syndesmotic compression. Negative calcaneal squeeze. Thompsons test negative. NV intact distally. Positive hop test.  MSK u/s left foot:  Peroneus brevis normal without tears or tenosynovitis.  Peroneus brevis thickened with tendinopathy about ankle to peroneal tubercle.  No cortical irregularity, edema overlying 5th metatarsal.   Assessment & Plan:  1. Left foot pain - in high volume runner localized primarily to base of 5th metatarsal with radiation up lateral ankle.  History, localization and exam concerning for 5th MT stress fracture.  Peroneus brevis appears normal.  Will go ahead with MRI to further assess.  No running in meantime.  Icing, tylenol as needed.

## 2019-11-01 DIAGNOSIS — J3089 Other allergic rhinitis: Secondary | ICD-10-CM | POA: Diagnosis not present

## 2019-11-01 DIAGNOSIS — J301 Allergic rhinitis due to pollen: Secondary | ICD-10-CM | POA: Diagnosis not present

## 2019-11-15 DIAGNOSIS — J301 Allergic rhinitis due to pollen: Secondary | ICD-10-CM | POA: Diagnosis not present

## 2019-11-15 DIAGNOSIS — J3089 Other allergic rhinitis: Secondary | ICD-10-CM | POA: Diagnosis not present

## 2019-11-19 ENCOUNTER — Other Ambulatory Visit: Payer: Self-pay

## 2019-11-19 ENCOUNTER — Ambulatory Visit
Admission: RE | Admit: 2019-11-19 | Discharge: 2019-11-19 | Disposition: A | Discharge status: Home / Self Care | Payer: BC Managed Care – PPO | Source: Ambulatory Visit | Attending: Family Medicine | Admitting: Family Medicine

## 2019-11-19 DIAGNOSIS — M19072 Primary osteoarthritis, left ankle and foot: Secondary | ICD-10-CM | POA: Diagnosis not present

## 2019-11-19 DIAGNOSIS — M79672 Pain in left foot: Secondary | ICD-10-CM

## 2019-11-19 DIAGNOSIS — R600 Localized edema: Secondary | ICD-10-CM | POA: Diagnosis not present

## 2019-11-22 DIAGNOSIS — J301 Allergic rhinitis due to pollen: Secondary | ICD-10-CM | POA: Diagnosis not present

## 2019-11-22 DIAGNOSIS — J3089 Other allergic rhinitis: Secondary | ICD-10-CM | POA: Diagnosis not present

## 2019-11-24 ENCOUNTER — Other Ambulatory Visit: Payer: Self-pay

## 2019-11-24 ENCOUNTER — Encounter: Payer: Self-pay | Admitting: Family Medicine

## 2019-11-24 ENCOUNTER — Ambulatory Visit (INDEPENDENT_AMBULATORY_CARE_PROVIDER_SITE_OTHER): Payer: BC Managed Care – PPO | Admitting: Family Medicine

## 2019-11-24 VITALS — BP 108/66 | Ht 70.0 in | Wt 145.0 lb

## 2019-11-24 DIAGNOSIS — M79604 Pain in right leg: Secondary | ICD-10-CM | POA: Diagnosis not present

## 2019-11-24 NOTE — Patient Instructions (Signed)
Continue with every other day running with stationary bike for cross training. Icing if needed 15 minutes at a time. Focus on low resistance hip adduction, straight leg raise exercises - maximum 3 sets of 10 once a day. Abdominal crunches as well. Keep me updated on how you're doing. Follow up with me in 1 month.

## 2019-11-24 NOTE — Progress Notes (Signed)
PCP: Corwin Levins, MD  Subjective:   HPI: Patient is a 64 y.o. male here for follow up on MRI results and for right leg weakness and pain. Patient states he stopped running for a while after the left foot pain started in July, but then his left IT band started hurting at the lateral knee when he started running again. Since then, he has been running every other day, and on off days he cross trains with an adductor/abductor machine, and he has done strengthening exercise and stretches for the IT band. This past Saturday 11/20/19 while running, his IT band recovered, but his right adductors, right groin area, and right abdominal muscles started hurting and feeling weak. He noticed that he struggled to do straight leg raises with 5 lb ankle weights on the right leg but not the left, and his right abdominal muscles hurt when he does crunches. He has been icing his legs when they hurt and taking Advil, which moderately helps. Denies pain radiating down the legs, tingling, or numbness.  Past Medical History:  Diagnosis Date  . ALLERGIC REACTION 07/22/2009  . ALLERGIC RHINITIS 10/08/2006  . B12 DEFICIENCY 05/14/2010  . Carpal tunnel syndrome 12/31/2007  . Degeneration of cervical intervertebral disc 12/31/2007  . HYPERLIPIDEMIA 10/08/2006  . HYPOTHYROIDISM 10/08/2006  . Irritable bowel syndrome 03/03/2008  . LUMBAR RADICULOPATHY, RIGHT 12/31/2006  . OSTEOARTHRITIS, LOWER LEG, LEFT 05/10/2009  . SINUSITIS - ACUTE-NOS 07/22/2009  . TESTICULAR HYPOFUNCTION 06/26/2009    Current Outpatient Medications on File Prior to Visit  Medication Sig Dispense Refill  . clobetasol (OLUX) 0.05 % topical foam clobetasol 0.05 % topical foam    . EPINEPHrine (EPIPEN 2-PAK) 0.3 mg/0.3 mL IJ SOAJ injection EpiPen 2-Pak 0.3 mg/0.3 mL injection, auto-injector    . fluticasone (FLONASE) 50 MCG/ACT nasal spray SPRAY 1 SPRAY INTO EACH NOSTRIL EVERY DAY  5  . levothyroxine (SYNTHROID) 150 MCG tablet TAKE 1 TABLET BY MOUTH 30 MINUTES  BEFORE BREAKFAST EVERY DAY 90 tablet 0  . loratadine (CLARITIN) 10 MG tablet Take 10 mg by mouth daily.    . montelukast (SINGULAIR) 10 MG tablet Take 10 mg by mouth at bedtime.    . Multiple Vitamin (MULTIVITAMIN) capsule Take 1 capsule by mouth daily.      . nitroGLYCERIN (NITRODUR - DOSED IN MG/24 HR) 0.2 mg/hr patch Place 1/4 patch to the affected area daily. 30 patch 1  . Olopatadine HCl 0.6 % SOLN     . Testosterone Undecanoate (JATENZO) 158 MG CAPS Take 1 capsule by mouth 2 (two) times daily. 60 capsule 3   No current facility-administered medications on file prior to visit.    Past Surgical History:  Procedure Laterality Date  . KNEE SURGERY    . LUMBAR LAMINECTOMY  2008   with revision  . SPINAL FUSION  2011  . spinal fusion revision  2012    Allergies  Allergen Reactions  . Iodine   . Levofloxacin     Social History   Socioeconomic History  . Marital status: Married    Spouse name: Not on file  . Number of children: Not on file  . Years of education: Not on file  . Highest education level: Not on file  Occupational History  . Not on file  Tobacco Use  . Smoking status: Never Smoker  . Smokeless tobacco: Never Used  Substance and Sexual Activity  . Alcohol use: No  . Drug use: No  . Sexual activity: Yes  Other Topics  Concern  . Not on file  Social History Narrative  . Not on file   Social Determinants of Health   Financial Resource Strain:   . Difficulty of Paying Living Expenses:   Food Insecurity:   . Worried About Programme researcher, broadcasting/film/video in the Last Year:   . Barista in the Last Year:   Transportation Needs:   . Freight forwarder (Medical):   Marland Kitchen Lack of Transportation (Non-Medical):   Physical Activity:   . Days of Exercise per Week:   . Minutes of Exercise per Session:   Stress:   . Feeling of Stress :   Social Connections:   . Frequency of Communication with Friends and Family:   . Frequency of Social Gatherings with Friends and  Family:   . Attends Religious Services:   . Active Member of Clubs or Organizations:   . Attends Banker Meetings:   Marland Kitchen Marital Status:   Intimate Partner Violence:   . Fear of Current or Ex-Partner:   . Emotionally Abused:   Marland Kitchen Physically Abused:   . Sexually Abused:     Family History  Problem Relation Age of Onset  . Hypothyroidism Father   . Asthma Other   . Diabetes Other   . Allergy (severe) Other   . Hypothyroidism Mother   . Hypothyroidism Sister   . Hypothyroidism Brother     BP 108/66   Ht 5\' 10"  (1.778 m)   Wt 145 lb (65.8 kg)   BMI 20.81 kg/m   Review of Systems: See HPI above.     Objective:  Physical Exam:  Gen: NAD, comfortable in exam room Resp: breathing comfortably at rest  Right leg/hip: -No visible deformity, swelling, or bruising on inspection -Tenderness to palpation of the right pubis, adductors, no TTP of rest of thigh or hip -FROM bilaterally, normal sensation -Weaker strength on extended right leg raise than on left side, 5/5 strength with hip adduction and abduction -Pain with resisted adduction on right side, pain with resisted crunches on right side lower abdominal wall -Negative FABER, FADIR   Assessment & Plan:  1. Right groin pain - differential includes adductor/hip flexor/abdominal strains, very early development of osteitis pubis, athletic pubalgia.  We discussed relative rest, icing, cross training if needed.  Strengthening exercises but advised not to overdo these.  F/u in 1 month for reevaluation.  2. Left foot pain - he reports this is largely resolved at this point.  MRI reviewed with him and no stress reaction or fracture.  Reassured.

## 2019-11-29 DIAGNOSIS — J301 Allergic rhinitis due to pollen: Secondary | ICD-10-CM | POA: Diagnosis not present

## 2019-11-29 DIAGNOSIS — J3089 Other allergic rhinitis: Secondary | ICD-10-CM | POA: Diagnosis not present

## 2019-12-06 DIAGNOSIS — J3089 Other allergic rhinitis: Secondary | ICD-10-CM | POA: Diagnosis not present

## 2019-12-06 DIAGNOSIS — J301 Allergic rhinitis due to pollen: Secondary | ICD-10-CM | POA: Diagnosis not present

## 2019-12-13 ENCOUNTER — Telehealth: Payer: Self-pay | Admitting: Endocrinology

## 2019-12-13 ENCOUNTER — Other Ambulatory Visit: Payer: Self-pay | Admitting: Endocrinology

## 2019-12-13 DIAGNOSIS — J301 Allergic rhinitis due to pollen: Secondary | ICD-10-CM | POA: Diagnosis not present

## 2019-12-13 DIAGNOSIS — J3089 Other allergic rhinitis: Secondary | ICD-10-CM | POA: Diagnosis not present

## 2019-12-13 MED ORDER — JATENZO 158 MG PO CAPS: 1.0000 | ORAL_CAPSULE | Freq: Two times a day (BID) | ORAL | 3 refills | Status: DC

## 2019-12-13 NOTE — Telephone Encounter (Signed)
Please advise 

## 2019-12-13 NOTE — Telephone Encounter (Signed)
Medication Refill Request  Did you call your pharmacy and request this refill first? Yes . If patient has not contacted pharmacy first, instruct them to do so for future refills.  . Remind them that contacting the pharmacy for their refill is the quickest method to get the refill.  . Refill policy also stated that it will take anywhere between 24-72 hours to receive the refill.    Name of medication?  Testosterone Undecanoate (JATENZO) 158 MG CAPS  Is this a 90 day supply? No  Name and location of pharmacy?  [id number] [place], [place] - [address] AT [address] Phone:  [phone]  Fax:  [phone]       . Is the request for diabetes test strips? No . If yes, what brand? N/A

## 2019-12-13 NOTE — Telephone Encounter (Signed)
Refill has been sent, needs appointment in October with labs

## 2019-12-13 NOTE — Telephone Encounter (Signed)
Advised patient prescription has been sent. Patient will call back to schedule labs and follow up.

## 2019-12-21 DIAGNOSIS — Z91013 Allergy to seafood: Secondary | ICD-10-CM | POA: Diagnosis not present

## 2019-12-21 DIAGNOSIS — T63441D Toxic effect of venom of bees, accidental (unintentional), subsequent encounter: Secondary | ICD-10-CM | POA: Diagnosis not present

## 2019-12-21 DIAGNOSIS — J301 Allergic rhinitis due to pollen: Secondary | ICD-10-CM | POA: Diagnosis not present

## 2019-12-21 DIAGNOSIS — J3089 Other allergic rhinitis: Secondary | ICD-10-CM | POA: Diagnosis not present

## 2019-12-22 ENCOUNTER — Ambulatory Visit (INDEPENDENT_AMBULATORY_CARE_PROVIDER_SITE_OTHER): Payer: BC Managed Care – PPO | Admitting: Family Medicine

## 2019-12-22 ENCOUNTER — Other Ambulatory Visit: Payer: Self-pay

## 2019-12-22 DIAGNOSIS — R1031 Right lower quadrant pain: Secondary | ICD-10-CM

## 2019-12-22 DIAGNOSIS — R1030 Lower abdominal pain, unspecified: Secondary | ICD-10-CM | POA: Insufficient documentation

## 2019-12-22 NOTE — Progress Notes (Signed)
    SUBJECTIVE:   CHIEF COMPLAINT / HPI:   Right Sided Groin Pain Patient states since he was seen last the pain in his groin region is much improved. He has been going to the gym 3 times per week for strengthening of his quads and abductor muscles. He has been able to increase his total mileage per week to 50-55 miles and states his pain gets to a 4-5 at the beginning of the week and then as he runs decreased to almost no pain. He continues to cross train with an elliptical as well. Overall, he is very happy with his progression and presents today to ensure he is on the right track.   PERTINENT  PMH / PSH: None  OBJECTIVE:   BP 118/72   Ht 5\' 10"  (1.778 m)   Wt 150 lb (68 kg)   BMI 21.52 kg/m   Hip, Right: No obvious rash, erythema, ecchymosis, or edema. ROM full in all directions; TTP along the abductor longus muscle. Strength 5/5 in IR/ER/Flex/Ext/Abd/Add. No pain elicited with internal or external rotation. Pelvic alignment unremarkable to inspection and palpation. Greater trochanter without tenderness to palpation. No SI joint tenderness and normal minimal SI movement.    ASSESSMENT/PLAN:   Groin pain Pain improved with modified activity and strength training. Encouraged patient to now slowly increase his total weekly mileage until he returns to his previous levels of 70 miles per week for his marathon training. Encouraged him to continue cross training and going to the gym and to let his pain guide how quickly he returns to his full training regimen. - F/u as needed     , DO PGY-4, Sports Medicine Fellow [facility]

## 2019-12-22 NOTE — Patient Instructions (Addendum)
It was great to meet you today! Thank you for letting me participate in your care!  Today, we discussed your improving right hip and groin pain. Please continue to progress your training and your running program. Let pain be your guide as to how quickly you return to your full running regimen. I think it would be a good idea to continue your cross training. Please also continue your strengthening exercises.  Be well, Jules Schick, DO PGY-4, Sports Medicine Fellow [facility]

## 2019-12-22 NOTE — Assessment & Plan Note (Signed)
Pain improved with modified activity and strength training. Encouraged patient to now slowly increase his total weekly mileage until he returns to his previous levels of 70 miles per week for his marathon training. Encouraged him to continue cross training and going to the gym and to let his pain guide how quickly he returns to his full training regimen. - F/u as needed

## 2019-12-23 ENCOUNTER — Encounter: Payer: Self-pay | Admitting: Family Medicine

## 2019-12-27 DIAGNOSIS — J301 Allergic rhinitis due to pollen: Secondary | ICD-10-CM | POA: Diagnosis not present

## 2019-12-27 DIAGNOSIS — J3089 Other allergic rhinitis: Secondary | ICD-10-CM | POA: Diagnosis not present

## 2020-01-03 ENCOUNTER — Telehealth: Payer: Self-pay | Admitting: Family Medicine

## 2020-01-03 ENCOUNTER — Other Ambulatory Visit: Payer: Self-pay | Admitting: *Deleted

## 2020-01-03 DIAGNOSIS — J3089 Other allergic rhinitis: Secondary | ICD-10-CM | POA: Diagnosis not present

## 2020-01-03 DIAGNOSIS — J301 Allergic rhinitis due to pollen: Secondary | ICD-10-CM | POA: Diagnosis not present

## 2020-01-03 MED ORDER — NITROGLYCERIN 0.2 MG/HR TD PT24
MEDICATED_PATCH | TRANSDERMAL | 0 refills | Status: DC
Start: 2020-01-03 — End: 2021-11-19

## 2020-01-03 NOTE — Telephone Encounter (Signed)
Refilled Rx for NitroDur

## 2020-01-03 NOTE — Telephone Encounter (Signed)
Patient states the Rx he has per pharmacy has expired & a new one will be needed from provider before any refills given.  --Forwarding refill request for :  nitroGLYCERIN (NITRODUR - DOSED IN MG/24 HR) 0.2 mg/hr patch [[id number]]   Order Details Dose, Route, Frequency: As Directed  Dispense Quantity: 30 patch Refills: 1       Sig: Place 1/4 patch to the affected area daily   Pt uses :   [id number] - [place], [place] - [address]  [address] [place] [address]  Phone:  [phone] Fax:  [phone]   --glh

## 2020-01-10 DIAGNOSIS — J3089 Other allergic rhinitis: Secondary | ICD-10-CM | POA: Diagnosis not present

## 2020-01-10 DIAGNOSIS — J301 Allergic rhinitis due to pollen: Secondary | ICD-10-CM | POA: Diagnosis not present

## 2020-01-17 DIAGNOSIS — J301 Allergic rhinitis due to pollen: Secondary | ICD-10-CM | POA: Diagnosis not present

## 2020-01-17 DIAGNOSIS — J3089 Other allergic rhinitis: Secondary | ICD-10-CM | POA: Diagnosis not present

## 2020-01-31 ENCOUNTER — Ambulatory Visit: Payer: Self-pay

## 2020-01-31 ENCOUNTER — Ambulatory Visit (INDEPENDENT_AMBULATORY_CARE_PROVIDER_SITE_OTHER): Payer: BC Managed Care – PPO | Admitting: Family Medicine

## 2020-01-31 ENCOUNTER — Other Ambulatory Visit: Payer: Self-pay

## 2020-01-31 VITALS — BP 113/89 | Ht 70.0 in | Wt 150.0 lb

## 2020-01-31 DIAGNOSIS — J3089 Other allergic rhinitis: Secondary | ICD-10-CM | POA: Diagnosis not present

## 2020-01-31 DIAGNOSIS — J301 Allergic rhinitis due to pollen: Secondary | ICD-10-CM | POA: Diagnosis not present

## 2020-01-31 DIAGNOSIS — M25562 Pain in left knee: Secondary | ICD-10-CM

## 2020-01-31 DIAGNOSIS — M84362A Stress fracture, left tibia, initial encounter for fracture: Secondary | ICD-10-CM | POA: Diagnosis not present

## 2020-01-31 DIAGNOSIS — J3081 Allergic rhinitis due to animal (cat) (dog) hair and dander: Secondary | ICD-10-CM | POA: Diagnosis not present

## 2020-01-31 DIAGNOSIS — M8430XA Stress fracture, unspecified site, initial encounter for fracture: Secondary | ICD-10-CM | POA: Diagnosis not present

## 2020-02-01 ENCOUNTER — Encounter: Payer: Self-pay | Admitting: Family Medicine

## 2020-02-01 NOTE — Progress Notes (Signed)
PCP: Corwin Levins, MD  Subjective:   HPI: Patient is a 64 y.o. male here for left shin pain.  Patient reports he started to get pain in his left shin on 9/8 when he documented first noticing this. He had more pain and issues with right groin at last visit and this has improved (though worsened after running [facility]). He had pain left anteromedial shin even before the race and was limping but was able to complete it and run the marathon. Pain left shin down to medial ankle. Associated swelling mildly. Noticed redness in this whole area as well but no fever, other complaints.  Past Medical History:  Diagnosis Date   ALLERGIC REACTION 07/22/2009   ALLERGIC RHINITIS 10/08/2006   B12 DEFICIENCY 05/14/2010   Carpal tunnel syndrome 12/31/2007   Degeneration of cervical intervertebral disc 12/31/2007   HYPERLIPIDEMIA 10/08/2006   HYPOTHYROIDISM 10/08/2006   Irritable bowel syndrome 03/03/2008   LUMBAR RADICULOPATHY, RIGHT 12/31/2006   OSTEOARTHRITIS, LOWER LEG, LEFT 05/10/2009   SINUSITIS - ACUTE-NOS 07/22/2009   TESTICULAR HYPOFUNCTION 06/26/2009    Current Outpatient Medications on File Prior to Visit  Medication Sig Dispense Refill   clobetasol (OLUX) 0.05 % topical foam clobetasol 0.05 % topical foam     EPINEPHrine (EPIPEN 2-PAK) 0.3 mg/0.3 mL IJ SOAJ injection EpiPen 2-Pak 0.3 mg/0.3 mL injection, auto-injector     fluticasone (FLONASE) 50 MCG/ACT nasal spray SPRAY 1 SPRAY INTO EACH NOSTRIL EVERY DAY  5   levothyroxine (SYNTHROID) 150 MCG tablet TAKE 1 TABLET BY MOUTH 30 MINUTES BEFORE BREAKFAST EVERY DAY 90 tablet 0   loratadine (CLARITIN) 10 MG tablet Take 10 mg by mouth daily.     montelukast (SINGULAIR) 10 MG tablet Take 10 mg by mouth at bedtime.     Multiple Vitamin (MULTIVITAMIN) capsule Take 1 capsule by mouth daily.       nitroGLYCERIN (NITRODUR - DOSED IN MG/24 HR) 0.2 mg/hr patch Place 1/4 patch to the affected area daily. 30 patch 0   Olopatadine HCl  0.6 % SOLN      Testosterone Undecanoate (JATENZO) 158 MG CAPS Take 1 capsule by mouth 2 (two) times daily. 60 capsule 3   No current facility-administered medications on file prior to visit.    Past Surgical History:  Procedure Laterality Date   KNEE SURGERY     LUMBAR LAMINECTOMY  2008   with revision   SPINAL FUSION  2011   spinal fusion revision  2012    Allergies  Allergen Reactions   Iodine    Levofloxacin     Social History   Socioeconomic History   Marital status: Married    Spouse name: Not on file   Number of children: Not on file   Years of education: Not on file   Highest education level: Not on file  Occupational History   Not on file  Tobacco Use   Smoking status: Never Smoker   Smokeless tobacco: Never Used  Substance and Sexual Activity   Alcohol use: No   Drug use: No   Sexual activity: Yes  Other Topics Concern   Not on file  Social History Narrative   Not on file   Social Determinants of Health   Financial Resource Strain:    Difficulty of Paying Living Expenses: Not on file  Food Insecurity:    Worried About Running Out of Food in the Last Year: Not on file   Ran Out of Food in the Last Year: Not on file  Transportation Needs:    Freight forwarder (Medical): Not on file   Lack of Transportation (Non-Medical): Not on file  Physical Activity:    Days of Exercise per Week: Not on file   Minutes of Exercise per Session: Not on file  Stress:    Feeling of Stress : Not on file  Social Connections:    Frequency of Communication with Friends and Family: Not on file   Frequency of Social Gatherings with Friends and Family: Not on file   Attends Religious Services: Not on file   Active Member of Clubs or Organizations: Not on file   Attends Banker Meetings: Not on file   Marital Status: Not on file  Intimate Partner Violence:    Fear of Current or Ex-Partner: Not on file   Emotionally  Abused: Not on file   Physically Abused: Not on file   Sexually Abused: Not on file    Family History  Problem Relation Age of Onset   Hypothyroidism Father    Asthma Other    Diabetes Other    Allergy (severe) Other    Hypothyroidism Mother    Hypothyroidism Sister    Hypothyroidism Brother     BP 113/89    Ht 5\' 10"  (1.778 m)    Wt 150 lb (68 kg)    BMI 21.52 kg/m   [facility] 12/22/2019 01/31/2020  Frequency of aerobic exercise (# of days/week) 7 7  Average time in minutes 90 90  Frequency of strengthening activities (# of days/week) 3 1    No flowsheet data found.  Review of Systems: See HPI above.     Objective:  Physical Exam:  Gen: NAD, comfortable in exam room  Left lower leg/ankle: No gross deformity, swelling, ecchymoses FROM with normal strength, no reproduction of pain. TTP focally over distal tibia.  No ankle, other tenderness. Negative ant drawer and talar tilt.   Negative syndesmotic compression. Thompsons test negative. Unable to hop due to pain. NV intact distally.  Limited MSK u/s left lower leg:  Cortical irregularity noted in area of tenderness distal tibia with overlying edema and neovascularity consistent with stress fracture.  Mild soft tissue edema noted as well.  No abnormalities noted of ankle - no effusion, malleolar abnormalities, nor talar dome OCD visualized.   Assessment & Plan:  1. Left distal tibia stress fracture - confirmed with ultrasound.  Long aircast provided and tibial stress fracture protocol reviewed and provided.  OTC medications as needed.  F/u in 2 weeks for reevaluation, repeat ultrasound.

## 2020-02-02 ENCOUNTER — Ambulatory Visit: Payer: BC Managed Care – PPO | Admitting: Family Medicine

## 2020-02-07 ENCOUNTER — Other Ambulatory Visit: Payer: Self-pay

## 2020-02-07 ENCOUNTER — Other Ambulatory Visit (INDEPENDENT_AMBULATORY_CARE_PROVIDER_SITE_OTHER): Payer: BC Managed Care – PPO

## 2020-02-07 ENCOUNTER — Other Ambulatory Visit: Payer: Self-pay | Admitting: Endocrinology

## 2020-02-07 ENCOUNTER — Ambulatory Visit: Payer: BC Managed Care – PPO | Admitting: Endocrinology

## 2020-02-07 DIAGNOSIS — E230 Hypopituitarism: Secondary | ICD-10-CM

## 2020-02-07 DIAGNOSIS — J301 Allergic rhinitis due to pollen: Secondary | ICD-10-CM | POA: Diagnosis not present

## 2020-02-07 DIAGNOSIS — E039 Hypothyroidism, unspecified: Secondary | ICD-10-CM

## 2020-02-07 DIAGNOSIS — J3089 Other allergic rhinitis: Secondary | ICD-10-CM | POA: Diagnosis not present

## 2020-02-07 DIAGNOSIS — J3081 Allergic rhinitis due to animal (cat) (dog) hair and dander: Secondary | ICD-10-CM | POA: Diagnosis not present

## 2020-02-07 LAB — CBC
HCT: 45.1 % (ref 39.0–52.0)
Hemoglobin: 15.4 g/dL (ref 13.0–17.0)
MCHC: 34 g/dL (ref 30.0–36.0)
MCV: 90.2 fl (ref 78.0–100.0)
Platelets: 237 10*3/uL (ref 150.0–400.0)
RBC: 5 Mil/uL (ref 4.22–5.81)
RDW: 12.8 % (ref 11.5–15.5)
WBC: 5.7 10*3/uL (ref 4.0–10.5)

## 2020-02-07 LAB — T4, FREE: Free T4: 0.91 ng/dL (ref 0.60–1.60)

## 2020-02-07 LAB — TSH: TSH: 4.11 u[IU]/mL (ref 0.35–4.50)

## 2020-02-07 LAB — TESTOSTERONE: Testosterone: 257.37 ng/dL — ABNORMAL LOW (ref 300.00–890.00)

## 2020-02-09 ENCOUNTER — Other Ambulatory Visit: Payer: Self-pay | Admitting: Endocrinology

## 2020-02-14 ENCOUNTER — Encounter: Payer: Self-pay | Admitting: Family Medicine

## 2020-02-14 ENCOUNTER — Ambulatory Visit: Payer: Self-pay

## 2020-02-14 ENCOUNTER — Other Ambulatory Visit: Payer: Self-pay

## 2020-02-14 ENCOUNTER — Ambulatory Visit (INDEPENDENT_AMBULATORY_CARE_PROVIDER_SITE_OTHER): Payer: BC Managed Care – PPO | Admitting: Family Medicine

## 2020-02-14 VITALS — BP 122/78 | Ht 70.0 in | Wt 150.0 lb

## 2020-02-14 DIAGNOSIS — M84362D Stress fracture, left tibia, subsequent encounter for fracture with routine healing: Secondary | ICD-10-CM

## 2020-02-14 DIAGNOSIS — J3089 Other allergic rhinitis: Secondary | ICD-10-CM | POA: Diagnosis not present

## 2020-02-14 DIAGNOSIS — J301 Allergic rhinitis due to pollen: Secondary | ICD-10-CM | POA: Diagnosis not present

## 2020-02-14 NOTE — Patient Instructions (Signed)
You're healing from your tibia stress fracture as expected. Continue on week 3 of the protocol this week. If you have problems message or call me. If you have more than a mild pain with week 3 you may have to postpone this for 1-2 more weeks. Icing as needed. Tylenol if needed. Otherwise follow up with me in 4 weeks for reevaluation.

## 2020-02-14 NOTE — Progress Notes (Signed)
PCP: Corwin Levins, MD  Subjective:   HPI: Patient is a 64 y.o. male here for left shin pain.  10/18: Patient reports he started to get pain in his left shin on 9/8 when he documented first noticing this. He had more pain and issues with right groin at last visit and this has improved (though worsened after running [facility]). He had pain left anteromedial shin even before the race and was limping but was able to complete it and run the marathon. Pain left shin down to medial ankle. Associated swelling mildly. Noticed redness in this whole area as well but no fever, other complaints.  11/1: Patient reports overall he's doing well. Pain has improved with walking. Able to use his airbike with aircast on. Having pain when attempting the calf raises on the protocol however. No new injuries.  Past Medical History:  Diagnosis Date   ALLERGIC REACTION 07/22/2009   ALLERGIC RHINITIS 10/08/2006   B12 DEFICIENCY 05/14/2010   Carpal tunnel syndrome 12/31/2007   Degeneration of cervical intervertebral disc 12/31/2007   HYPERLIPIDEMIA 10/08/2006   HYPOTHYROIDISM 10/08/2006   Irritable bowel syndrome 03/03/2008   LUMBAR RADICULOPATHY, RIGHT 12/31/2006   OSTEOARTHRITIS, LOWER LEG, LEFT 05/10/2009   SINUSITIS - ACUTE-NOS 07/22/2009   TESTICULAR HYPOFUNCTION 06/26/2009    Current Outpatient Medications on File Prior to Visit  Medication Sig Dispense Refill   clobetasol (OLUX) 0.05 % topical foam clobetasol 0.05 % topical foam     EPINEPHrine (EPIPEN 2-PAK) 0.3 mg/0.3 mL IJ SOAJ injection EpiPen 2-Pak 0.3 mg/0.3 mL injection, auto-injector     fluticasone (FLONASE) 50 MCG/ACT nasal spray SPRAY 1 SPRAY INTO EACH NOSTRIL EVERY DAY  5   levothyroxine (SYNTHROID) 150 MCG tablet TAKE 1 TABLET BY MOUTH 30 MINUTES BEFORE BREAKFAST EVERY DAY 90 tablet 0   loratadine (CLARITIN) 10 MG tablet Take 10 mg by mouth daily.     montelukast (SINGULAIR) 10 MG tablet Take 10 mg by mouth at  bedtime.     Multiple Vitamin (MULTIVITAMIN) capsule Take 1 capsule by mouth daily.       nitroGLYCERIN (NITRODUR - DOSED IN MG/24 HR) 0.2 mg/hr patch Place 1/4 patch to the affected area daily. 30 patch 0   Olopatadine HCl 0.6 % SOLN      Testosterone Undecanoate (JATENZO) 158 MG CAPS Take 1 capsule by mouth 2 (two) times daily. 60 capsule 3   No current facility-administered medications on file prior to visit.    Past Surgical History:  Procedure Laterality Date   KNEE SURGERY     LUMBAR LAMINECTOMY  2008   with revision   SPINAL FUSION  2011   spinal fusion revision  2012    Allergies  Allergen Reactions   Iodine    Levofloxacin     Social History   Socioeconomic History   Marital status: Married    Spouse name: Not on file   Number of children: Not on file   Years of education: Not on file   Highest education level: Not on file  Occupational History   Not on file  Tobacco Use   Smoking status: Never Smoker   Smokeless tobacco: Never Used  Substance and Sexual Activity   Alcohol use: No   Drug use: No   Sexual activity: Yes  Other Topics Concern   Not on file  Social History Narrative   Not on file   Social Determinants of Health   Financial Resource Strain:    Difficulty of Paying Living  Expenses: Not on file  Food Insecurity:    Worried About Programme researcher, broadcasting/film/video in the Last Year: Not on file   [employer] of Food in the Last Year: Not on file  Transportation Needs:    Lack of Transportation (Medical): Not on file   Lack of Transportation (Non-Medical): Not on file  Physical Activity:    Days of Exercise per Week: Not on file   Minutes of Exercise per Session: Not on file  Stress:    Feeling of Stress : Not on file  Social Connections:    Frequency of Communication with Friends and Family: Not on file   Frequency of Social Gatherings with Friends and Family: Not on file   Attends Religious Services: Not on file    Active Member of Clubs or Organizations: Not on file   Attends Banker Meetings: Not on file   Marital Status: Not on file  Intimate Partner Violence:    Fear of Current or Ex-Partner: Not on file   Emotionally Abused: Not on file   Physically Abused: Not on file   Sexually Abused: Not on file    Family History  Problem Relation Age of Onset   Hypothyroidism Father    Asthma Other    Diabetes Other    Allergy (severe) Other    Hypothyroidism Mother    Hypothyroidism Sister    Hypothyroidism Brother     BP 122/78    Ht 5\' 10"  (1.778 m)    Wt 150 lb (68 kg)    BMI 21.52 kg/m   [facility] Exercise 12/22/2019 01/31/2020 02/14/2020  Frequency of aerobic exercise (# of days/week) 7 7 6   Average time in minutes 90 90 75  Frequency of strengthening activities (# of days/week) 3 1 0    No flowsheet data found.  Review of Systems: See HPI above.     Objective:  Physical Exam:  Gen: NAD, comfortable in exam room  Left lower leg/ankle: No gross deformity, swelling, ecchymoses FROM TTP focally distal tibia.  No other tenderness. NV intact distally. Deferred hop test  Limited MSK u/s left lower leg:  Cortical irregularity and edema unchanged but increased neovascularity compared to last visit.  Assessment & Plan:  1. Left distal tibia stress fracture - increased neovascularity on ultrasound and mild clinical improvement.  Stop strengthening exercises as these are causing pain.  Follow protocol - now on week 3.  Tylenol, icing if needed.  F/u in 4 weeks but call or message with any concerns in the interim.

## 2020-02-15 ENCOUNTER — Other Ambulatory Visit: Payer: Self-pay

## 2020-02-15 ENCOUNTER — Encounter: Payer: Self-pay | Admitting: Endocrinology

## 2020-02-15 ENCOUNTER — Ambulatory Visit (INDEPENDENT_AMBULATORY_CARE_PROVIDER_SITE_OTHER): Payer: BC Managed Care – PPO | Admitting: Endocrinology

## 2020-02-15 VITALS — BP 110/70 | HR 59 | Ht 70.0 in | Wt 152.4 lb

## 2020-02-15 DIAGNOSIS — E230 Hypopituitarism: Secondary | ICD-10-CM

## 2020-02-15 DIAGNOSIS — E039 Hypothyroidism, unspecified: Secondary | ICD-10-CM

## 2020-02-15 MED ORDER — JATENZO 198 MG PO CAPS: ORAL_CAPSULE | ORAL | 3 refills | Status: DC

## 2020-02-15 NOTE — Patient Instructions (Signed)
Take Synthroid before eating in am

## 2020-02-15 NOTE — Progress Notes (Signed)
Patient ID: Joseph Santana, male   DOB: Jun 15, 1955, 64 y.o.   MRN: [id number]            Reason for Appointment:  Follow-up of low testosterone      History of Present Illness:   HYPOGONADISM: Prior history: He was diagnosed to have a low testosterone level in 2012 or so when he was coming to his physician with complaints of increased fatigue.  Not clear what his baseline testosterone level was but he had a level of about 130 in 2013 around the time when he was having back surgery No record of detail evaluation with pituitary function available He thinks he was tried on AndroGel initially but because of skin irritation he was changed to testosterone injections.  He may have been prescribed Axiron also but does not remember this He thinks he had significantly better with taking testosterone injections, apparently was taking about 100 mg every 2 weeks He stopped taking the injections 6-8 months prior to his initial consultation  as they were not renewed and he had a change in his PCP  RECENT history: On his initial consultation he was complaining of fatigue  Baseline testosterone was 208 with LH of 2.1 and normal prolactin, free testosterone not done by the lab  He had been on a trial of clomiphene half tablet, initially 3 times a week since 01/11/16. With this he had more energy after starting treatment With this his testosterone level came back to normal until 2/18 when it was 494, subsequently had been progressively declining and was 273 done in 1/19 This was despite taking 50 mg 5 days a week; with this he was complaining of more fatigue and sluggishness and decreased motivation   Also complaining of decreased libido  RECENT history: Previously on Fortesta gel but this was changed because of redness, irritation and sunburn-like reaction on the skin Because of the skin irritation he was changed to the Axiron preparation, but this could not keep his levels consistent  He has been able  to start [place] after approval through his insurance He appears to again have inconsistent levels He was advised to take his morning dose with breakfast and evening dose with dinner and he is doing this  For the last month or so he has had more fatigue and daytime somnolence without change in his stamina level, currently training for marathon and getting up earlier in the morning  Without any change in his compliance he has had a significant drop in his testosterone level now down to 257 compared to 555  His lab was drawn at about 2 PM and he takes his morning medication around 5 AM  His hemoglobin and also last PSA have been quite normal   He has had the following testosterone levels recently  Lab Results  Component Value Date   TESTOSTERONE 257.37 (L) 02/07/2020   TESTOSTERONE 554.70 07/28/2019   TESTOSTERONE 329.83 06/29/2019   TESTOSTERONE 192.55 (L) 06/02/2019    Lab Results  Component Value Date   LH 3.39 03/03/2017   LH 2.94 02/28/2016   LH 2.10 01/10/2016   Lab Results  Component Value Date   HGB 15.4 02/07/2020   Hypothyroidism was first diagnosed  about 30 years ago  At the time of diagnosis patient was having symptoms of  fatigue but he is not sure about all the other symptoms Most probably tested also because of strong family history of thyroid disease  This had been followed by PCP  He  takes his thyroid supplement regularly in the morning  He has been on levothyroxine 150 mcg daily for some time  He has had some fatigue as above He now says that he takes his levothyroxine with breakfast with his other medications and not on empty stomach TSH has been trending higher but still upper normal          Patient's weight history is as follows:  Wt Readings from Last 3 Encounters:  02/15/20 152 lb 6.4 oz (69.1 kg)  02/14/20 150 lb (68 kg)  01/31/20 150 lb (68 kg)    Thyroid function results have been as follows:  Lab Results  Component Value Date   TSH  4.11 02/07/2020   TSH 3.67 07/20/2019   TSH 2.74 06/29/2019   FREET4 0.91 02/07/2020   FREET4 1.04 06/29/2019   FREET4 1.28 01/07/2018      Past Medical History:  Diagnosis Date  . ALLERGIC REACTION 07/22/2009  . ALLERGIC RHINITIS 10/08/2006  . B12 DEFICIENCY 05/14/2010  . Carpal tunnel syndrome 12/31/2007  . Degeneration of cervical intervertebral disc 12/31/2007  . HYPERLIPIDEMIA 10/08/2006  . HYPOTHYROIDISM 10/08/2006  . Irritable bowel syndrome 03/03/2008  . LUMBAR RADICULOPATHY, RIGHT 12/31/2006  . OSTEOARTHRITIS, LOWER LEG, LEFT 05/10/2009  . SINUSITIS - ACUTE-NOS 07/22/2009  . TESTICULAR HYPOFUNCTION 06/26/2009    Past Surgical History:  Procedure Laterality Date  . KNEE SURGERY    . LUMBAR LAMINECTOMY  2008   with revision  . SPINAL FUSION  2011  . spinal fusion revision  2012    Family History  Problem Relation Age of Onset  . Hypothyroidism Father   . Asthma Other   . Diabetes Other   . Allergy (severe) Other   . Hypothyroidism Mother   . Hypothyroidism Sister   . Hypothyroidism Brother     Social History:  reports that he has never smoked. He has never used smokeless tobacco. He reports that he does not drink alcohol and does not use drugs.  Allergies:  Allergies  Allergen Reactions  . Iodine   . Levofloxacin     Allergies as of 02/15/2020      Reactions   Iodine    Levofloxacin       Medication List       Accurate as of February 15, 2020  4:08 PM. If you have any questions, ask your nurse or doctor.        STOP taking these medications   clobetasol 0.05 % topical foam Commonly known as: OLUX Stopped by: Reather Littler, MD     TAKE these medications   EpiPen 2-Pak 0.3 mg/0.3 mL Soaj injection Generic drug: EPINEPHrine EpiPen 2-Pak 0.3 mg/0.3 mL injection, auto-injector   fluticasone 50 MCG/ACT nasal spray Commonly known as: FLONASE SPRAY 1 SPRAY INTO EACH NOSTRIL EVERY DAY   Jatenzo 158 MG Caps Generic drug: Testosterone Undecanoate Take 1  capsule by mouth 2 (two) times daily.   levothyroxine 150 MCG tablet Commonly known as: SYNTHROID TAKE 1 TABLET BY MOUTH 30 MINUTES BEFORE BREAKFAST EVERY DAY   loratadine 10 MG tablet Commonly known as: CLARITIN Take 10 mg by mouth daily.   montelukast 10 MG tablet Commonly known as: SINGULAIR Take 10 mg by mouth at bedtime.   multivitamin capsule Take 1 capsule by mouth daily.   NASACORT ALLERGY 24HR NA Place into the nose.   nitroGLYCERIN 0.2 mg/hr patch Commonly known as: NITRODUR - Dosed in mg/24 hr Place 1/4 patch to the affected area daily.  Olopatadine HCl 0.6 % Soln       Review of Systems  He has had chronic insomnia        Examination:    BP 110/70   Pulse (!) 59   Ht 5\' 10"  (1.778 m)   Wt 152 lb 6.4 oz (69.1 kg)   SpO2 92%   BMI 21.87 kg/m     Assessment:  HYPOGONADISM:   He has hypogonadotropic hypogonadism with baseline testosterone of 208 and has been symptomatic He is taking Jatenzo 158 mg twice daily Although initially his testosterone level was progressively higher now with the same dose his level is below 300 although taken about 8 hours after his dose in the morning He has recently started feeling more sleepy during the daytime and has a fatigue  He is able to get a $0 co-pay for his medication and has not missed any doses  HYPOTHYROIDISM, TSH upper normal May not be getting complete resumption by taking levothyroxine with breakfast Not clear if his daytime fatigue is related to mild hypothyroidism  PLAN:  He will increase Jatenzo to 198 mg He will need to make sure he takes it with food with a small amount of fat at least, okay to take it with his peanut butter in the morning and mixed meal in the evening Recheck labs in about a month  Switch levothyroxine to at least 15 minutes before breakfast without changing the dose as yet   02/15/2020, 4:08 PM

## 2020-02-17 DIAGNOSIS — J3081 Allergic rhinitis due to animal (cat) (dog) hair and dander: Secondary | ICD-10-CM | POA: Diagnosis not present

## 2020-02-17 DIAGNOSIS — J301 Allergic rhinitis due to pollen: Secondary | ICD-10-CM | POA: Diagnosis not present

## 2020-02-18 DIAGNOSIS — J3089 Other allergic rhinitis: Secondary | ICD-10-CM | POA: Diagnosis not present

## 2020-02-21 DIAGNOSIS — J3089 Other allergic rhinitis: Secondary | ICD-10-CM | POA: Diagnosis not present

## 2020-02-21 DIAGNOSIS — J3081 Allergic rhinitis due to animal (cat) (dog) hair and dander: Secondary | ICD-10-CM | POA: Diagnosis not present

## 2020-02-21 DIAGNOSIS — J301 Allergic rhinitis due to pollen: Secondary | ICD-10-CM | POA: Diagnosis not present

## 2020-02-25 ENCOUNTER — Other Ambulatory Visit: Payer: Self-pay

## 2020-02-25 DIAGNOSIS — M25572 Pain in left ankle and joints of left foot: Secondary | ICD-10-CM

## 2020-02-28 DIAGNOSIS — J301 Allergic rhinitis due to pollen: Secondary | ICD-10-CM | POA: Diagnosis not present

## 2020-02-28 DIAGNOSIS — J3089 Other allergic rhinitis: Secondary | ICD-10-CM | POA: Diagnosis not present

## 2020-02-28 DIAGNOSIS — J3081 Allergic rhinitis due to animal (cat) (dog) hair and dander: Secondary | ICD-10-CM | POA: Diagnosis not present

## 2020-02-29 ENCOUNTER — Ambulatory Visit
Admission: RE | Admit: 2020-02-29 | Discharge: 2020-02-29 | Disposition: A | Discharge status: Home / Self Care | Payer: BC Managed Care – PPO | Source: Ambulatory Visit | Attending: Family Medicine | Admitting: Family Medicine

## 2020-02-29 DIAGNOSIS — M25572 Pain in left ankle and joints of left foot: Secondary | ICD-10-CM | POA: Diagnosis not present

## 2020-03-13 DIAGNOSIS — J3081 Allergic rhinitis due to animal (cat) (dog) hair and dander: Secondary | ICD-10-CM | POA: Diagnosis not present

## 2020-03-13 DIAGNOSIS — J301 Allergic rhinitis due to pollen: Secondary | ICD-10-CM | POA: Diagnosis not present

## 2020-03-13 DIAGNOSIS — J3089 Other allergic rhinitis: Secondary | ICD-10-CM | POA: Diagnosis not present

## 2020-03-15 ENCOUNTER — Other Ambulatory Visit: Payer: Self-pay

## 2020-03-15 ENCOUNTER — Ambulatory Visit (INDEPENDENT_AMBULATORY_CARE_PROVIDER_SITE_OTHER): Payer: BC Managed Care – PPO | Admitting: Family Medicine

## 2020-03-15 VITALS — BP 110/64 | Ht 70.0 in | Wt 150.0 lb

## 2020-03-15 DIAGNOSIS — M84362D Stress fracture, left tibia, subsequent encounter for fracture with routine healing: Secondary | ICD-10-CM

## 2020-03-15 DIAGNOSIS — M25572 Pain in left ankle and joints of left foot: Secondary | ICD-10-CM

## 2020-03-15 NOTE — Patient Instructions (Signed)
I'm pleased with the progress you're making. The ultrasound looks great with more callus than last time. Get the MRI tomorrow as scheduled - I'll call you with the results when I get them, hopefully Friday. Icing as needed. Follow the stress fracture protocol as you have been with cross training on off days. Add abdominal exercises 3 days a week as we discussed.

## 2020-03-15 NOTE — Progress Notes (Signed)
PCP: Corwin Levins, MD  Subjective:   HPI: Patient is a 64 y.o. male here for follow-up on left shin pain due to stress fracture.  Patient presented for initial office visit on 10/18.  He had pain in left anterior medial shin prior to the [facility] but completed the [employer].  Pain had persisted so he wanted evaluation.  Ultrasound showed overlying edema and cortical irregularity and distal tibia consistent with a stress fracture.  Aircast provided to patient.  Patient followed up on 11/1 and has been doing well and using his Aircast while riding a stationary bike.  Patient was still having pain in the medial aspect of distal tibia as well as in the ankle.  He stopped strengthening exercises causing the pain.    On 11/10 the patient messaged sports medicine team regarding worsening pain in his anteromedial ankle.  An MRI of his ankle was ordered and will be done tomorrow 12/2.  Patient reports that over the last 2 weeks his pain has improved and that he was able to run a slow mile with the Aircast on his treadmill yesterday.  Still has tenderness to palpation along anterior aspect of distal tibia as well as in the ankle.  No swelling noted.  No new injuries.  Past Medical History:  Diagnosis Date  . ALLERGIC REACTION 07/22/2009  . ALLERGIC RHINITIS 10/08/2006  . B12 DEFICIENCY 05/14/2010  . Carpal tunnel syndrome 12/31/2007  . Degeneration of cervical intervertebral disc 12/31/2007  . HYPERLIPIDEMIA 10/08/2006  . HYPOTHYROIDISM 10/08/2006  . Irritable bowel syndrome 03/03/2008  . LUMBAR RADICULOPATHY, RIGHT 12/31/2006  . OSTEOARTHRITIS, LOWER LEG, LEFT 05/10/2009  . SINUSITIS - ACUTE-NOS 07/22/2009  . TESTICULAR HYPOFUNCTION 06/26/2009    Current Outpatient Medications on File Prior to Visit  Medication Sig Dispense Refill  . EPINEPHrine (EPIPEN 2-PAK) 0.3 mg/0.3 mL IJ SOAJ injection EpiPen 2-Pak 0.3 mg/0.3 mL injection, auto-injector    . fluticasone (FLONASE) 50 MCG/ACT nasal spray  SPRAY 1 SPRAY INTO EACH NOSTRIL EVERY DAY (Patient not taking: Reported on 02/15/2020)  5  . JATENZO 198 MG CAPS Take 1 capsule at breakfast and dinner daily 60 capsule 3  . levothyroxine (SYNTHROID) 150 MCG tablet TAKE 1 TABLET BY MOUTH 30 MINUTES BEFORE BREAKFAST EVERY DAY 90 tablet 0  . loratadine (CLARITIN) 10 MG tablet Take 10 mg by mouth daily. (Patient not taking: Reported on 02/15/2020)    . montelukast (SINGULAIR) 10 MG tablet Take 10 mg by mouth at bedtime.    . Multiple Vitamin (MULTIVITAMIN) capsule Take 1 capsule by mouth daily.      . nitroGLYCERIN (NITRODUR - DOSED IN MG/24 HR) 0.2 mg/hr patch Place 1/4 patch to the affected area daily. 30 patch 0  . Olopatadine HCl 0.6 % SOLN  (Patient not taking: Reported on 02/15/2020)    . Triamcinolone Acetonide (NASACORT ALLERGY 24HR NA) Place into the nose.     No current facility-administered medications on file prior to visit.    Past Surgical History:  Procedure Laterality Date  . KNEE SURGERY    . LUMBAR LAMINECTOMY  2008   with revision  . SPINAL FUSION  2011  . spinal fusion revision  2012    Allergies  Allergen Reactions  . Iodine   . Levofloxacin     Social History   Socioeconomic History  . Marital status: Married    Spouse name: Not on file  . Number of children: Not on file  . Years of education: Not on  file  . Highest education level: Not on file  Occupational History  . Not on file  Tobacco Use  . Smoking status: Never Smoker  . Smokeless tobacco: Never Used  Substance and Sexual Activity  . Alcohol use: No  . Drug use: No  . Sexual activity: Yes  Other Topics Concern  . Not on file  Social History Narrative  . Not on file   Social Determinants of Health   Financial Resource Strain:   . Difficulty of Paying Living Expenses: Not on file  Food Insecurity:   . Worried About Programme researcher, broadcasting/film/video in the Last Year: Not on file  . Ran Out of Food in the Last Year: Not on file  Transportation Needs:   .  Lack of Transportation (Medical): Not on file  . Lack of Transportation (Non-Medical): Not on file  Physical Activity:   . Days of Exercise per Week: Not on file  . Minutes of Exercise per Session: Not on file  Stress:   . Feeling of Stress : Not on file  Social Connections:   . Frequency of Communication with Friends and Family: Not on file  . Frequency of Social Gatherings with Friends and Family: Not on file  . Attends Religious Services: Not on file  . Active Member of Clubs or Organizations: Not on file  . Attends Banker Meetings: Not on file  . Marital Status: Not on file  Intimate Partner Violence:   . Fear of Current or Ex-Partner: Not on file  . Emotionally Abused: Not on file  . Physically Abused: Not on file  . Sexually Abused: Not on file    Family History  Problem Relation Age of Onset  . Hypothyroidism Father   . Asthma Other   . Diabetes Other   . Allergy (severe) Other   . Hypothyroidism Mother   . Hypothyroidism Sister   . Hypothyroidism Brother     BP 110/64   Ht 5\' 10"  (1.778 m)   Wt 150 lb (68 kg)   BMI 21.52 kg/m   [facility] 12/22/2019 01/31/2020 02/14/2020 03/15/2020  Frequency of aerobic exercise (# of days/week) 7 7 6 6   Average time in minutes 90 90 75 75  Frequency of strengthening activities (# of days/week) 3 1 0 0    No flowsheet data found.  Review of Systems: See HPI above.     Objective:  Physical Exam:  Gen: NAD, comfortable in exam room  Left lower leg/ankle exam: No gross deformity noted.  No swelling or ecchymosis.  Patient has full range of motion.  Patient is tender to palpation in the distal tibia as well as medial aspect of ankle just inferior to the medial malleolus.  Neurovascularly intact.  Limited MSK U/S of left lower leg showed findings consistent with healing stress fracture - interval callus noted with surrounding edema and neovascularity.   Assessment & Plan:  Left distal  tibia stress fracture Patient continues to heal well.  Ultrasound encouraging for proper healing of the stress fracture.  Recommended continuation of wearing Aircast if he is going to run.  Roughly 8 weeks into stress fracture protocol.  Continue Tylenol and icing as needed.  Patient will go to have ankle MRI tomorrow 12/2.  We will follow-up after MRI results.  We also briefly discussed concern his pain from sports hernia on right may return as he increases running volume - encouraged abdominal exercises which we reviewed.

## 2020-03-16 ENCOUNTER — Encounter: Payer: Self-pay | Admitting: Family Medicine

## 2020-03-16 ENCOUNTER — Ambulatory Visit
Admission: RE | Admit: 2020-03-16 | Discharge: 2020-03-16 | Disposition: A | Discharge status: Home / Self Care | Payer: BC Managed Care – PPO | Source: Ambulatory Visit | Attending: Family Medicine | Admitting: Family Medicine

## 2020-03-16 ENCOUNTER — Other Ambulatory Visit: Payer: Self-pay

## 2020-03-16 DIAGNOSIS — M722 Plantar fascial fibromatosis: Secondary | ICD-10-CM | POA: Diagnosis not present

## 2020-03-16 DIAGNOSIS — M25572 Pain in left ankle and joints of left foot: Secondary | ICD-10-CM

## 2020-03-16 DIAGNOSIS — R600 Localized edema: Secondary | ICD-10-CM | POA: Diagnosis not present

## 2020-03-16 DIAGNOSIS — M65872 Other synovitis and tenosynovitis, left ankle and foot: Secondary | ICD-10-CM | POA: Diagnosis not present

## 2020-03-16 DIAGNOSIS — M19072 Primary osteoarthritis, left ankle and foot: Secondary | ICD-10-CM | POA: Diagnosis not present

## 2020-03-20 DIAGNOSIS — J3089 Other allergic rhinitis: Secondary | ICD-10-CM | POA: Diagnosis not present

## 2020-03-20 DIAGNOSIS — J301 Allergic rhinitis due to pollen: Secondary | ICD-10-CM | POA: Diagnosis not present

## 2020-03-21 ENCOUNTER — Other Ambulatory Visit: Payer: Self-pay

## 2020-03-21 ENCOUNTER — Other Ambulatory Visit (INDEPENDENT_AMBULATORY_CARE_PROVIDER_SITE_OTHER): Payer: BC Managed Care – PPO

## 2020-03-21 DIAGNOSIS — E230 Hypopituitarism: Secondary | ICD-10-CM

## 2020-03-21 DIAGNOSIS — E039 Hypothyroidism, unspecified: Secondary | ICD-10-CM | POA: Diagnosis not present

## 2020-03-21 LAB — TESTOSTERONE: Testosterone: 487.59 ng/dL (ref 300.00–890.00)

## 2020-03-21 LAB — TSH: TSH: 4.36 u[IU]/mL (ref 0.35–4.50)

## 2020-03-21 NOTE — Progress Notes (Signed)
Thyroid level borderline, increase dose of levothyroxine by half tablet weekly in addition to 150 mcg daily.  Stay on 198 mg Jatenzo for testosterone as before

## 2020-03-23 ENCOUNTER — Other Ambulatory Visit: Payer: Self-pay | Admitting: Endocrinology

## 2020-03-23 MED ORDER — LEVOTHYROXINE SODIUM 25 MCG PO TABS: 12.5000 ug | ORAL_TABLET | Freq: Every day | ORAL | 3 refills | Status: DC

## 2020-03-23 NOTE — Progress Notes (Signed)
Thyroid level borderline, increase dose of levothyroxine by half tablet weekly in addition to 150 mcg daily.  Stay on 198 mg Jatenzo for testosterone as before

## 2020-03-27 ENCOUNTER — Telehealth: Payer: BC Managed Care – PPO | Admitting: Family

## 2020-03-27 ENCOUNTER — Other Ambulatory Visit: Payer: Self-pay | Admitting: *Deleted

## 2020-03-27 DIAGNOSIS — J301 Allergic rhinitis due to pollen: Secondary | ICD-10-CM | POA: Diagnosis not present

## 2020-03-27 DIAGNOSIS — J3081 Allergic rhinitis due to animal (cat) (dog) hair and dander: Secondary | ICD-10-CM | POA: Diagnosis not present

## 2020-03-27 DIAGNOSIS — J019 Acute sinusitis, unspecified: Secondary | ICD-10-CM

## 2020-03-27 DIAGNOSIS — J3089 Other allergic rhinitis: Secondary | ICD-10-CM | POA: Diagnosis not present

## 2020-03-27 MED ORDER — AMOXICILLIN-POT CLAVULANATE 875-125 MG PO TABS: 1.0000 | ORAL_TABLET | Freq: Two times a day (BID) | ORAL | 0 refills | Status: DC

## 2020-03-27 MED ORDER — LEVOTHYROXINE SODIUM 25 MCG PO TABS: ORAL_TABLET | ORAL | 1 refills | Status: DC

## 2020-03-27 NOTE — Progress Notes (Signed)

## 2020-04-02 MED ORDER — CEFDINIR 300 MG PO CAPS: 300.0000 mg | ORAL_CAPSULE | Freq: Two times a day (BID) | ORAL | 0 refills | Status: AC

## 2020-04-02 NOTE — Addendum Note (Signed)
Addended by: Jackquline Berlin on: 04/02/2020 11:24 AM   Modules accepted: Orders

## 2020-04-02 NOTE — Progress Notes (Signed)
We are sorry that you are not feeling well.  Here is how we plan to help!  Based on what you have shared with me it looks like you have sinusitis.  Sinusitis is inflammation and infection in the sinus cavities of the head.  Based on your presentation I believe you most likely have Acute Bacterial Sinusitis.  This is an infection caused by bacteria and is treated with antibiotics. I have prescribed Cefdinir You may use an oral decongestant such as Mucinex D or if you have glaucoma or high blood pressure use plain Mucinex. Saline nasal spray help and can safely be used as often as needed for congestion.  If you develop worsening sinus pain, fever or notice severe headache and vision changes, or if symptoms are not better after completion of antibiotic, please schedule an appointment with a health care provider.    Sinus infections are not as easily transmitted as other respiratory infection, however we still recommend that you avoid close contact with loved ones, especially the very young and elderly.  Remember to wash your hands thoroughly throughout the day as this is the number one way to prevent the spread of infection!  Home Care:  Only take medications as instructed by your medical team.  Complete the entire course of an antibiotic.  Do not take these medications with alcohol.  A steam or ultrasonic humidifier can help congestion.  You can place a towel over your head and breathe in the steam from hot water coming from a faucet.  Avoid close contacts especially the very young and the elderly.  Cover your mouth when you cough or sneeze.  Always remember to wash your hands.  Get Help Right Away If:  You develop worsening fever or sinus pain.  You develop a severe head ache or visual changes.  Your symptoms persist after you have completed your treatment plan.  Make sure you  Understand these instructions.  Will watch your condition.  Will get help right away if you are not doing  well or get worse.  Your e-visit answers were reviewed by a board certified advanced clinical practitioner to complete your personal care plan.  Depending on the condition, your plan could have included both over the counter or prescription medications.  If there is a problem please reply  once you have received a response from your provider.  Your safety is important to [place].  If you have drug allergies check your prescription carefully.    You can use MyChart to ask questions about today's visit, request a non-urgent call back, or ask for a work or school excuse for 24 hours related to this e-Visit. If it has been greater than 24 hours you will need to follow up with your provider, or enter a new e-Visit to address those concerns.  You will get an e-mail in the next two days asking about your experience.  I hope that your e-visit has been valuable and will speed your recovery. Thank you for using e-visits.  I have spent at least 5 minutes reviewing and documenting in the patient's chart.

## 2020-04-03 ENCOUNTER — Encounter: Payer: Self-pay | Admitting: Family Medicine

## 2020-04-03 ENCOUNTER — Other Ambulatory Visit: Payer: Self-pay

## 2020-04-03 ENCOUNTER — Ambulatory Visit (INDEPENDENT_AMBULATORY_CARE_PROVIDER_SITE_OTHER): Payer: BC Managed Care – PPO | Admitting: Family Medicine

## 2020-04-03 VITALS — BP 110/78 | Ht 70.0 in | Wt 150.0 lb

## 2020-04-03 DIAGNOSIS — J301 Allergic rhinitis due to pollen: Secondary | ICD-10-CM | POA: Diagnosis not present

## 2020-04-03 DIAGNOSIS — M84362D Stress fracture, left tibia, subsequent encounter for fracture with routine healing: Secondary | ICD-10-CM

## 2020-04-03 DIAGNOSIS — J3089 Other allergic rhinitis: Secondary | ICD-10-CM | POA: Diagnosis not present

## 2020-04-03 DIAGNOSIS — J3081 Allergic rhinitis due to animal (cat) (dog) hair and dander: Secondary | ICD-10-CM | POA: Diagnosis not present

## 2020-04-03 NOTE — Progress Notes (Signed)
PCP: Corwin Levins, MD  Subjective:   HPI: Patient is a 64 y.o. male here for left ankle pain follow-up.  Patient was initially seen on 10/18 due to left shin pain.  He was diagnosed with a stress fracture and started on stress fracture protocol.  He was reevaluated on 11/1 and reported he had been doing well but he did have to stop at the strengthening exercises because the pain was worsening.  He then messaged sports medicine on 11/10 regarding worsening pain since MRI was ordered.  MRI completed on 12/2 showed distal tibialis posterior tenosynovitis.  It also showed patchy indistinct marrow edema in the distal tibial shaft and metaphysis.  With increased edema compared to 11/19/2019.  Also may be a sprain in the deep tibiotalar portion of the deltoid ligament.  Subcutaneous edema along the medial anterior ankle was also seen.  12/20 Patient reports that he has continued to do none weightbearing exercises such as riding exercise bike and using the elliptical.  He does acknowledge that he ran 3 separate miles over the last few weeks.  2 of the mile runs he was wearing his brace and the third he was not.  Reports that the run where he did not wear the brace he noticed a significant amount of pain after the run as well as the that night.  Pain is in his tibia as well as in his ankle medially.  He takes ibuprofen intermittently as needed for the pain.  Past Medical History:  Diagnosis Date  . ALLERGIC REACTION 07/22/2009  . ALLERGIC RHINITIS 10/08/2006  . B12 DEFICIENCY 05/14/2010  . Carpal tunnel syndrome 12/31/2007  . Degeneration of cervical intervertebral disc 12/31/2007  . HYPERLIPIDEMIA 10/08/2006  . HYPOTHYROIDISM 10/08/2006  . Irritable bowel syndrome 03/03/2008  . LUMBAR RADICULOPATHY, RIGHT 12/31/2006  . OSTEOARTHRITIS, LOWER LEG, LEFT 05/10/2009  . SINUSITIS - ACUTE-NOS 07/22/2009  . TESTICULAR HYPOFUNCTION 06/26/2009    Current Outpatient Medications on File Prior to Visit  Medication Sig  Dispense Refill  . amoxicillin-clavulanate (AUGMENTIN) 875-125 MG tablet Take 1 tablet by mouth 2 (two) times daily. 14 tablet 0  . cefdinir (OMNICEF) 300 MG capsule Take 1 capsule (300 mg total) by mouth 2 (two) times daily for 10 days. 20 capsule 0  . EPINEPHrine (EPIPEN 2-PAK) 0.3 mg/0.3 mL IJ SOAJ injection EpiPen 2-Pak 0.3 mg/0.3 mL injection, auto-injector    . fluticasone (FLONASE) 50 MCG/ACT nasal spray SPRAY 1 SPRAY INTO EACH NOSTRIL EVERY DAY (Patient not taking: Reported on 02/15/2020)  5  . JATENZO 198 MG CAPS Take 1 capsule at breakfast and dinner daily 60 capsule 3  . levothyroxine (SYNTHROID) 150 MCG tablet TAKE 1 TABLET BY MOUTH 30 MINUTES BEFORE BREAKFAST EVERY DAY 90 tablet 0  . levothyroxine (SYNTHROID) 25 MCG tablet Take 1 tablet daily 90 tablet 1  . loratadine (CLARITIN) 10 MG tablet Take 10 mg by mouth daily. (Patient not taking: Reported on 02/15/2020)    . montelukast (SINGULAIR) 10 MG tablet Take 10 mg by mouth at bedtime.    . Multiple Vitamin (MULTIVITAMIN) capsule Take 1 capsule by mouth daily.      . nitroGLYCERIN (NITRODUR - DOSED IN MG/24 HR) 0.2 mg/hr patch Place 1/4 patch to the affected area daily. 30 patch 0  . Olopatadine HCl 0.6 % SOLN  (Patient not taking: Reported on 02/15/2020)    . Triamcinolone Acetonide (NASACORT ALLERGY 24HR NA) Place into the nose.     No current facility-administered medications on file prior  to visit.    Past Surgical History:  Procedure Laterality Date  . KNEE SURGERY    . LUMBAR LAMINECTOMY  2008   with revision  . SPINAL FUSION  2011  . spinal fusion revision  2012    Allergies  Allergen Reactions  . Iodine   . Levofloxacin     Social History   Socioeconomic History  . Marital status: Married    Spouse name: Not on file  . Number of children: Not on file  . Years of education: Not on file  . Highest education level: Not on file  Occupational History  . Not on file  Tobacco Use  . Smoking status: Never Smoker   . Smokeless tobacco: Never Used  Substance and Sexual Activity  . Alcohol use: No  . Drug use: No  . Sexual activity: Yes  Other Topics Concern  . Not on file  Social History Narrative  . Not on file   Social Determinants of Health   Financial Resource Strain: Not on file  Food Insecurity: Not on file  Transportation Needs: Not on file  Physical Activity: Not on file  Stress: Not on file  Social Connections: Not on file  Intimate Partner Violence: Not on file    Family History  Problem Relation Age of Onset  . Hypothyroidism Father   . Asthma Other   . Diabetes Other   . Allergy (severe) Other   . Hypothyroidism Mother   . Hypothyroidism Sister   . Hypothyroidism Brother     There were no vitals taken for this visit.  [facility] 12/22/2019 01/31/2020 02/14/2020 03/15/2020  Frequency of aerobic exercise (# of days/week) 7 7 6 6   Average time in minutes 90 90 75 75  Frequency of strengthening activities (# of days/week) 3 1 0 0    No flowsheet data found.  Review of Systems: See HPI above.     Objective:  Physical Exam:  Gen: NAD, comfortable in exam room  Lower leg/ankle/foot, left: No visible erythema, swelling, ecchymosis, or bony deformity.  Tenderness to palpation in the distal tibia medially.  No notable pes planus/cavus deformity. Transverse arch grossly intact; No evidence of tibiotalar deviation; Range of motion is full in all directions. Strength is 5/5 in all directions.  Tenderness to palpation along inferior aspect of medial malleolus.  Tenderness to palpation along plantar fascia proximally.  Limited MSK U/S of left lower leg showing findings consistent with a stress fracture including continued interval callus.  Surrounding edema and neovascularity improved from previous exam.   Assessment & Plan:  1.  Left distal tibia stress fracture Patient continues to heal.  Recommend that he stop running for the next 4 weeks and  continue to use exercise bike and elliptical as tolerated.  You may also ice or use Tylenol as needed.  Follow-up in 4 weeks for reevaluation.

## 2020-04-03 NOTE — Patient Instructions (Signed)
Ok for biking and the elliptical as long as these aren't causing pain. Stop running for the next 4 weeks. Icing, tylenol if needed. Follow up with me in 4 weeks for reevaluation.

## 2020-04-05 DIAGNOSIS — J3089 Other allergic rhinitis: Secondary | ICD-10-CM | POA: Diagnosis not present

## 2020-04-05 DIAGNOSIS — R21 Rash and other nonspecific skin eruption: Secondary | ICD-10-CM | POA: Diagnosis not present

## 2020-04-05 DIAGNOSIS — Z91013 Allergy to seafood: Secondary | ICD-10-CM | POA: Diagnosis not present

## 2020-04-05 DIAGNOSIS — J301 Allergic rhinitis due to pollen: Secondary | ICD-10-CM | POA: Diagnosis not present

## 2020-04-05 DIAGNOSIS — T63441A Toxic effect of venom of bees, accidental (unintentional), initial encounter: Secondary | ICD-10-CM | POA: Diagnosis not present

## 2020-04-11 DIAGNOSIS — J3089 Other allergic rhinitis: Secondary | ICD-10-CM | POA: Diagnosis not present

## 2020-04-11 DIAGNOSIS — J3081 Allergic rhinitis due to animal (cat) (dog) hair and dander: Secondary | ICD-10-CM | POA: Diagnosis not present

## 2020-04-11 DIAGNOSIS — J301 Allergic rhinitis due to pollen: Secondary | ICD-10-CM | POA: Diagnosis not present

## 2020-04-17 ENCOUNTER — Ambulatory Visit: Payer: BC Managed Care – PPO | Admitting: Family

## 2020-04-17 DIAGNOSIS — J3081 Allergic rhinitis due to animal (cat) (dog) hair and dander: Secondary | ICD-10-CM | POA: Diagnosis not present

## 2020-04-17 DIAGNOSIS — J301 Allergic rhinitis due to pollen: Secondary | ICD-10-CM | POA: Diagnosis not present

## 2020-04-17 DIAGNOSIS — J3089 Other allergic rhinitis: Secondary | ICD-10-CM | POA: Diagnosis not present

## 2020-04-21 ENCOUNTER — Other Ambulatory Visit: Payer: Self-pay

## 2020-04-21 ENCOUNTER — Encounter: Payer: Self-pay | Admitting: Internal Medicine

## 2020-04-21 ENCOUNTER — Ambulatory Visit (INDEPENDENT_AMBULATORY_CARE_PROVIDER_SITE_OTHER): Payer: BC Managed Care – PPO | Admitting: Internal Medicine

## 2020-04-21 DIAGNOSIS — S60351D Superficial foreign body of right thumb, subsequent encounter: Secondary | ICD-10-CM | POA: Diagnosis not present

## 2020-04-21 DIAGNOSIS — E039 Hypothyroidism, unspecified: Secondary | ICD-10-CM | POA: Diagnosis not present

## 2020-04-21 DIAGNOSIS — S60351A Superficial foreign body of right thumb, initial encounter: Secondary | ICD-10-CM | POA: Insufficient documentation

## 2020-04-21 MED ORDER — DOXYCYCLINE HYCLATE 100 MG PO TABS: 100.0000 mg | ORAL_TABLET | Freq: Two times a day (BID) | ORAL | 0 refills | Status: DC

## 2020-04-21 NOTE — Progress Notes (Signed)
Established Patient Office Visit  Subjective:  Patient ID: Joseph Santana, male    DOB: 02-14-1956  Age: 65 y.o. MRN: [id number]      Chief Complaint: right thumb red swelling area and low thyroid       HPI:  Joseph Santana is a 65 y.o. male here with c/o right thumb very mild discomfort, swelling spongy red but enlarging area just proximal to the nail bed for 2 wks, after stuck by a rose thorn and not sure he got all the foreign body out.   Did have a lesion to the thumb ? From a rose thorn, seen per allergy, tx with steroid shot, nystatin topical, and finish his current antibx.  And to come here if not resolved.  Also c/o acute problem of: Wearing left leg brace for left tibia oct 2021.  Did have sinus infection better with amoxil then ceftin course.  Denies hyper or hypo thyroid symptoms such as voice, skin or hair change.  Pt denies chest pain, increased sob or doe, wheezing, orthopnea, PND, increased LE swelling, palpitations, dizziness or syncope.   Pt denies polydipsia, polyuria,.        Wt Readings from Last 3 Encounters:  04/21/20 157 lb (71.2 kg)  04/03/20 150 lb (68 kg)  03/15/20 150 lb (68 kg)   BP Readings from Last 3 Encounters:  04/21/20 112/72  04/03/20 110/78  03/15/20 110/64    Past Medical History:  Diagnosis Date  . ALLERGIC REACTION 07/22/2009  . ALLERGIC RHINITIS 10/08/2006  . B12 DEFICIENCY 05/14/2010  . Carpal tunnel syndrome 12/31/2007  . Degeneration of cervical intervertebral disc 12/31/2007  . HYPERLIPIDEMIA 10/08/2006  . HYPOTHYROIDISM 10/08/2006  . Irritable bowel syndrome 03/03/2008  . LUMBAR RADICULOPATHY, RIGHT 12/31/2006  . OSTEOARTHRITIS, LOWER LEG, LEFT 05/10/2009  . SINUSITIS - ACUTE-NOS 07/22/2009  . TESTICULAR HYPOFUNCTION 06/26/2009   Past Surgical History:  Procedure Laterality Date  . KNEE SURGERY    . LUMBAR LAMINECTOMY  2008   with revision  . SPINAL FUSION  2011  . spinal fusion revision  2012    reports that he has never smoked. He has  never used smokeless tobacco. He reports that he does not drink alcohol and does not use drugs. family history includes Allergy (severe) in an other family member; Asthma in an other family member; Diabetes in an other family member; Hypothyroidism in his brother, father, mother, and sister. Allergies  Allergen Reactions  . Iodine   . Levofloxacin    Current Outpatient Medications on File Prior to Visit  Medication Sig Dispense Refill  . amoxicillin-clavulanate (AUGMENTIN) 875-125 MG tablet Take 1 tablet by mouth 2 (two) times daily. 14 tablet 0  . EPINEPHrine 0.3 mg/0.3 mL IJ SOAJ injection EpiPen 2-Pak 0.3 mg/0.3 mL injection, auto-injector    . fluticasone (FLONASE) 50 MCG/ACT nasal spray SPRAY 1 SPRAY INTO EACH NOSTRIL EVERY DAY  5  . JATENZO 198 MG CAPS Take 1 capsule at breakfast and dinner daily 60 capsule 3  . levothyroxine (SYNTHROID) 150 MCG tablet TAKE 1 TABLET BY MOUTH 30 MINUTES BEFORE BREAKFAST EVERY DAY 90 tablet 0  . levothyroxine (SYNTHROID) 25 MCG tablet Take 1 tablet daily 90 tablet 1  . loratadine (CLARITIN) 10 MG tablet Take 10 mg by mouth daily.    . montelukast (SINGULAIR) 10 MG tablet Take 10 mg by mouth at bedtime.    . Multiple Vitamin (MULTIVITAMIN) capsule Take 1 capsule by mouth daily.    . nitroGLYCERIN (NITRODUR -  DOSED IN MG/24 HR) 0.2 mg/hr patch Place 1/4 patch to the affected area daily. 30 patch 0  . Olopatadine HCl 0.6 % SOLN     . Triamcinolone Acetonide (NASACORT ALLERGY 24HR NA) Place into the nose.     No current facility-administered medications on file prior to visit.        ROS:  All others reviewed and negative.  Objective        PE:  BP 112/72   Pulse 60   Temp 98 F (36.7 C) (Oral)   Ht 5\' 10"  (1.778 m)   Wt 157 lb (71.2 kg)   SpO2 99%   BMI 22.53 kg/m                 Constitutional: Pt appears in NAD               HENT: Head: NCAT.                Right Ear: External ear normal.                 Left Ear: External ear normal.                 Eyes: . Pupils are equal, round, and reactive to light. Conjunctivae and EOM are normal               Nose: without d/c or deformity               Neck: Neck supple. Gross normal ROM               Cardiovascular: Normal rate and regular rhythm.                 Pulmonary/Chest: Effort normal and breath sounds without rales or wheezing.                Right thumb with spongy slight discomfort mild red raised swelling just prox to the nailbed right thumb               Neurological: Pt is alert. At baseline orientation, motor grossly intact               Skin: Skin is warm. No rashes, no other new lesions, LE edema -                Psychiatric: Pt behavior is normal without agitation   Assessment/Plan:  Joseph Santana is a 65 y.o. White or Caucasian [1] male with  has a past medical history of ALLERGIC REACTION (07/22/2009), ALLERGIC RHINITIS (10/08/2006), B12 DEFICIENCY (05/14/2010), Carpal tunnel syndrome (12/31/2007), Degeneration of cervical intervertebral disc (12/31/2007), HYPERLIPIDEMIA (10/08/2006), HYPOTHYROIDISM (10/08/2006), Irritable bowel syndrome (03/03/2008), LUMBAR RADICULOPATHY, RIGHT (12/31/2006), OSTEOARTHRITIS, LOWER LEG, LEFT (05/10/2009), SINUSITIS - ACUTE-NOS (07/22/2009), and TESTICULAR HYPOFUNCTION (06/26/2009).   Assessment Plan  See problem oriented assessment and plan Labs reviewed for each problem: Lab Results  Component Value Date   WBC 5.7 02/07/2020   HGB 15.4 02/07/2020   HCT 45.1 02/07/2020   PLT 237.0 02/07/2020   GLUCOSE 88 07/20/2019   CHOL 160 07/20/2019   TRIG 74.0 07/20/2019   HDL 55.10 07/20/2019   LDLDIRECT 146.2 11/19/2012   LDLCALC 90 07/20/2019   ALT 37 07/20/2019   AST 49 (H) 07/20/2019   NA 139 07/20/2019   K 4.3 07/20/2019   CL 101 07/20/2019   CREATININE 1.16 07/20/2019   BUN 19 07/20/2019   CO2 33 (H) 07/20/2019   TSH  4.36 03/21/2020   PSA 1.12 07/20/2019    Micro: none  Cardiac tracings I have personally interpreted today:   none  Pertinent Radiological findings (summarize): none    There are no preventive care reminders to display for this patient.  There are no preventive care reminders to display for this patient.  Lab Results  Component Value Date   TSH 4.36 03/21/2020   Lab Results  Component Value Date   WBC 5.7 02/07/2020   HGB 15.4 02/07/2020   HCT 45.1 02/07/2020   MCV 90.2 02/07/2020   PLT 237.0 02/07/2020   Lab Results  Component Value Date   NA 139 07/20/2019   K 4.3 07/20/2019   CO2 33 (H) 07/20/2019   GLUCOSE 88 07/20/2019   BUN 19 07/20/2019   CREATININE 1.16 07/20/2019   BILITOT 0.6 07/20/2019   ALKPHOS 55 07/20/2019   AST 49 (H) 07/20/2019   ALT 37 07/20/2019   PROT 6.7 07/20/2019   ALBUMIN 4.4 07/20/2019   CALCIUM 9.3 07/20/2019   GFR 63.90 07/20/2019   Lab Results  Component Value Date   CHOL 160 07/20/2019   Lab Results  Component Value Date   HDL 55.10 07/20/2019   Lab Results  Component Value Date   LDLCALC 90 07/20/2019   Lab Results  Component Value Date   TRIG 74.0 07/20/2019   Lab Results  Component Value Date   CHOLHDL 3 07/20/2019   No results found for: HGBA1C    Assessment & Plan:   Problem List Items Addressed This Visit      High   Foreign body of right thumb    Suspect retained foreign body - for hand surgury referral, doxy course,  to f/u any worsening symptoms or concerns      Relevant Orders   Ambulatory referral to Hand Surgery     Medium   Hypothyroidism    Lab Results  Component Value Date   TSH 4.36 03/21/2020  stable, cont synthorid         Meds ordered this encounter  Medications  . doxycycline (VIBRA-TABS) 100 MG tablet    Sig: Take 1 tablet (100 mg total) by mouth 2 (two) times daily.    Dispense:  20 tablet    Refill:  0    Follow-up: Return if symptoms worsen or fail to improve.    Cathlean Cower, MD 04/23/2020 5:12 PM [facility]

## 2020-04-21 NOTE — Patient Instructions (Signed)
Please take all new medication as prescribed - the antibiotic  You will be contacted regarding the referral for: hand surgury (urgent)  Please continue all other medications as before, and refills have been done if requested.  Please have the pharmacy call with any other refills you may need.  Please keep your appointments with your specialists as you may have planned

## 2020-04-22 DIAGNOSIS — Z1152 Encounter for screening for COVID-19: Secondary | ICD-10-CM | POA: Diagnosis not present

## 2020-04-23 ENCOUNTER — Encounter: Payer: Self-pay | Admitting: Internal Medicine

## 2020-04-23 NOTE — Assessment & Plan Note (Signed)
Lab Results  Component Value Date   TSH 4.36 03/21/2020  stable, cont synthorid

## 2020-04-23 NOTE — Assessment & Plan Note (Addendum)
Suspect retained foreign body - for hand surgury referral, doxy course,  to f/u any worsening symptoms or concerns

## 2020-04-24 DIAGNOSIS — J301 Allergic rhinitis due to pollen: Secondary | ICD-10-CM | POA: Diagnosis not present

## 2020-04-24 DIAGNOSIS — J3089 Other allergic rhinitis: Secondary | ICD-10-CM | POA: Diagnosis not present

## 2020-04-24 DIAGNOSIS — J3081 Allergic rhinitis due to animal (cat) (dog) hair and dander: Secondary | ICD-10-CM | POA: Diagnosis not present

## 2020-05-03 ENCOUNTER — Encounter: Payer: Self-pay | Admitting: Orthopaedic Surgery

## 2020-05-03 ENCOUNTER — Ambulatory Visit (INDEPENDENT_AMBULATORY_CARE_PROVIDER_SITE_OTHER): Payer: BC Managed Care – PPO | Admitting: Orthopaedic Surgery

## 2020-05-03 ENCOUNTER — Ambulatory Visit: Payer: Self-pay

## 2020-05-03 ENCOUNTER — Other Ambulatory Visit: Payer: Self-pay

## 2020-05-03 DIAGNOSIS — M79644 Pain in right finger(s): Secondary | ICD-10-CM | POA: Diagnosis not present

## 2020-05-03 NOTE — Progress Notes (Signed)
Office Visit Note   Patient: Joseph Santana           Date of Birth: Dec 29, 1955           MRN: [id number] Visit Date: 05/03/2020              Requested by: Corwin Levins, MD [address] [place],  [place] [address] PCP: Corwin Levins, MD   Assessment & Plan: Visit Diagnoses:  1. 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  I will consult infectious disease on their opinion as to his options are from an oral antifungal perspective versus surgical I&D.  I will be in touch with the patient later today.  Follow-Up Instructions: Return if symptoms worsen or fail to improve.   Orders:  Orders Placed This Encounter  Procedures  . XR Finger Thumb Right   No orders of the defined types were placed in this encounter.     Procedures: No procedures performed   Clinical Data: No additional findings.   Subjective: Chief Complaint  Patient presents with  . Right Thumb - Pain    Joseph Santana is a very pleasant 65 year old gentleman who comes in for evaluation of chronic right thumb swelling and suspected infection for 2 months.  He is an avid gardener and noticed a couple of the rose thorns that were stuck in his right thumb.  He has been on 3 courses of antibiotics and has had a prednisone shot by an allergist all of which have failed to eradicate the swelling in the symptoms.  He denies any constitutional symptoms.  He does notice 3 whitish dots and swollen areas just proximal to the cuticle and just distal to the the IP crease.   Review of Systems  Constitutional: Negative.   All other systems reviewed and are negative.    Objective: Vital Signs: There were no vitals taken for this visit.  Physical Exam Vitals and nursing note reviewed.  Constitutional:      Appearance: He is well-developed and well-nourished.  HENT:     Head: Normocephalic and atraumatic.  Eyes:     Pupils: Pupils are equal,  round, and reactive to light.  Pulmonary:     Effort: Pulmonary effort is normal.  Abdominal:     Palpations: Abdomen is soft.  Musculoskeletal:        General: Normal range of motion.     Cervical back: Neck supple.  Skin:    General: Skin is warm.  Neurological:     Mental Status: He is alert and oriented to person, place, and time.  Psychiatric:        Mood and Affect: Mood and affect normal.        Behavior: Behavior normal.        Thought Content: Thought content normal.        Judgment: Judgment normal.     Ortho Exam Right thumb shows no neurovascular compromise.  He has good range of motion.  Sensation intact.  He has 3 whitish dot areas on the dorsal aspect of the thumb about halfway between the IP crease and cuticle.  They are slightly tender to palpation.  Specialty Comments:  No specialty comments available.  Imaging: XR Finger Thumb Right  Result Date: 05/03/2020 No acute or structural abnormalities.    PMFS History: Patient Active Problem List   Diagnosis Date Noted  . Foreign body of right thumb  04/21/2020  . Groin pain 12/22/2019  . Quadriceps tendinitis 06/07/2019  . Osteoarthritis, shoulder 06/07/2019  . Left foot pain 07/10/2017  . Hypogonadism in male 06/26/2009  . Hypothyroidism 10/08/2006   Past Medical History:  Diagnosis Date  . ALLERGIC REACTION 07/22/2009  . ALLERGIC RHINITIS 10/08/2006  . B12 DEFICIENCY 05/14/2010  . Carpal tunnel syndrome 12/31/2007  . Degeneration of cervical intervertebral disc 12/31/2007  . HYPERLIPIDEMIA 10/08/2006  . HYPOTHYROIDISM 10/08/2006  . Irritable bowel syndrome 03/03/2008  . LUMBAR RADICULOPATHY, RIGHT 12/31/2006  . OSTEOARTHRITIS, LOWER LEG, LEFT 05/10/2009  . SINUSITIS - ACUTE-NOS 07/22/2009  . TESTICULAR HYPOFUNCTION 06/26/2009    Family History  Problem Relation Age of Onset  . Hypothyroidism Father   . Asthma Other   . Diabetes Other   . Allergy (severe) Other   . Hypothyroidism Mother   .  Hypothyroidism Sister   . Hypothyroidism Brother     Past Surgical History:  Procedure Laterality Date  . KNEE SURGERY    . LUMBAR LAMINECTOMY  2008   with revision  . SPINAL FUSION  2011  . spinal fusion revision  2012   Social History   Occupational History  . Not on file  Tobacco Use  . Smoking status: Never Smoker  . Smokeless tobacco: Never Used  Substance and Sexual Activity  . Alcohol use: No  . Drug use: No  . Sexual activity: Yes

## 2020-05-04 ENCOUNTER — Telehealth: Payer: Self-pay | Admitting: Internal Medicine

## 2020-05-04 ENCOUNTER — Other Ambulatory Visit: Payer: Self-pay | Admitting: Endocrinology

## 2020-05-04 NOTE — Telephone Encounter (Signed)
Casimiro Needle,  Given the chronicity of his thumb infection and the array of potential pathogens (Sporothrix, nocardia, mycobacteria, fungi...) I favor incision and drainage with tissue sent for routine, AFB and fungal cultures before deciding on further antibiotic therapy.  I would stop any empiric antibiotic therapy now.  I will arrange for him to be seen in our clinic sometime next week.  Cortni Tays ===View-only below this line=== ----- Message ----- From: Tarry Kos, MD Sent: 05/03/2020  10:12 PM EST To: Cliffton Asters, MD  Lowry Bowl, I saw this patient this morning and it appears that he has a low grade infection in his thumb from gardening particularly roses.  I wanted to get your advice on whether I should treat him empirically with antifungals or take him to the OR for an I&D.  If you would like to see him in your office, I'm happy to send him your way for a consult as well.  Let me know what you think.  Thank you.  Casimiro Needle

## 2020-05-04 NOTE — Telephone Encounter (Signed)
-----   Message from Tarry Kos, MD sent at 05/03/2020 10:09 PM EST ----- Joseph Santana, I saw this patient this morning and it appears that he has a low grade infection in his thumb from gardening particularly roses.  I wanted to get your advice on whether I should treat him empirically with antifungals or take him to the OR for an I&D.  If you would like to see him in your office, I'm happy to send him your way for a consult as well.  Let me know what you think.  Thank you.  Casimiro Needle

## 2020-05-08 ENCOUNTER — Encounter: Payer: Self-pay | Admitting: Family Medicine

## 2020-05-08 ENCOUNTER — Ambulatory Visit (INDEPENDENT_AMBULATORY_CARE_PROVIDER_SITE_OTHER): Payer: BC Managed Care – PPO | Admitting: Family Medicine

## 2020-05-08 ENCOUNTER — Other Ambulatory Visit: Payer: Self-pay

## 2020-05-08 VITALS — BP 128/64 | Ht 70.0 in | Wt 150.0 lb

## 2020-05-08 DIAGNOSIS — J3081 Allergic rhinitis due to animal (cat) (dog) hair and dander: Secondary | ICD-10-CM | POA: Diagnosis not present

## 2020-05-08 DIAGNOSIS — M25562 Pain in left knee: Secondary | ICD-10-CM

## 2020-05-08 DIAGNOSIS — J301 Allergic rhinitis due to pollen: Secondary | ICD-10-CM | POA: Diagnosis not present

## 2020-05-08 DIAGNOSIS — J3089 Other allergic rhinitis: Secondary | ICD-10-CM | POA: Diagnosis not present

## 2020-05-08 NOTE — Progress Notes (Signed)
PCP: Corwin Levins, MD  Subjective:   HPI: Patient is a 65 y.o. male here for left shin pain.  12/20: Patient was initially seen on 10/18 due to left shin pain.  He was diagnosed with a stress fracture and started on stress fracture protocol.  He was reevaluated on 11/1 and reported he had been doing well but he did have to stop at the strengthening exercises because the pain was worsening.  He then messaged sports medicine on 11/10 regarding worsening pain since MRI was ordered.  MRI completed on 12/2 showed distal tibialis posterior tenosynovitis.  It also showed patchy indistinct marrow edema in the distal tibial shaft and metaphysis.  With increased edema compared to 11/19/2019.  Also may be a sprain in the deep tibiotalar portion of the deltoid ligament.  Subcutaneous edema along the medial anterior ankle was also seen.  Patient reports that he has continued to do none weightbearing exercises such as riding exercise bike and using the elliptical.  He does acknowledge that he ran 3 separate miles over the last few weeks.  2 of the mile runs he was wearing his brace and the third he was not.  Reports that the run where he did not wear the brace he noticed a significant amount of pain after the run as well as the that night.  Pain is in his tibia as well as in his ankle medially.  He takes ibuprofen intermittently as needed for the pain.  05/08/20: Patient reports pain has improved since last visit but still present. He had been cross training daily but has backed down to every other day. Able to bike 8-9 miles at a time without a problem. Stopped wearing aircast on 1/15. Stopped using elliptical. He tried running 9 minute mile pace but pain still persistent left medial shin.  Past Medical History:  Diagnosis Date  . ALLERGIC REACTION 07/22/2009  . ALLERGIC RHINITIS 10/08/2006  . B12 DEFICIENCY 05/14/2010  . Carpal tunnel syndrome 12/31/2007  . Degeneration of cervical intervertebral disc  12/31/2007  . HYPERLIPIDEMIA 10/08/2006  . HYPOTHYROIDISM 10/08/2006  . Irritable bowel syndrome 03/03/2008  . LUMBAR RADICULOPATHY, RIGHT 12/31/2006  . OSTEOARTHRITIS, LOWER LEG, LEFT 05/10/2009  . SINUSITIS - ACUTE-NOS 07/22/2009  . TESTICULAR HYPOFUNCTION 06/26/2009    Current Outpatient Medications on File Prior to Visit  Medication Sig Dispense Refill  . amoxicillin-clavulanate (AUGMENTIN) 875-125 MG tablet Take 1 tablet by mouth 2 (two) times daily. 14 tablet 0  . doxycycline (VIBRA-TABS) 100 MG tablet Take 1 tablet (100 mg total) by mouth 2 (two) times daily. 20 tablet 0  . EPINEPHrine 0.3 mg/0.3 mL IJ SOAJ injection EpiPen 2-Pak 0.3 mg/0.3 mL injection, auto-injector    . fluticasone (FLONASE) 50 MCG/ACT nasal spray SPRAY 1 SPRAY INTO EACH NOSTRIL EVERY DAY  5  . JATENZO 198 MG CAPS Take 1 capsule at breakfast and dinner daily 60 capsule 3  . levothyroxine (SYNTHROID) 150 MCG tablet TAKE 1 TABLET BY MOUTH 30 MINUTES BEFORE BREAKFAST EVERY DAY 90 tablet 1  . levothyroxine (SYNTHROID) 25 MCG tablet Take 1 tablet daily 90 tablet 1  . loratadine (CLARITIN) 10 MG tablet Take 10 mg by mouth daily.    . montelukast (SINGULAIR) 10 MG tablet Take 10 mg by mouth at bedtime.    . Multiple Vitamin (MULTIVITAMIN) capsule Take 1 capsule by mouth daily.    . nitroGLYCERIN (NITRODUR - DOSED IN MG/24 HR) 0.2 mg/hr patch Place 1/4 patch to the affected area daily. 30 patch  0  . Olopatadine HCl 0.6 % SOLN     . Triamcinolone Acetonide (NASACORT ALLERGY 24HR NA) Place into the nose.     No current facility-administered medications on file prior to visit.    Past Surgical History:  Procedure Laterality Date  . KNEE SURGERY    . LUMBAR LAMINECTOMY  2008   with revision  . SPINAL FUSION  2011  . spinal fusion revision  2012    Allergies  Allergen Reactions  . Iodine   . Levofloxacin     Social History   Socioeconomic History  . Marital status: Married    Spouse name: Not on file  . Number  of children: Not on file  . Years of education: Not on file  . Highest education level: Not on file  Occupational History  . Not on file  Tobacco Use  . Smoking status: Never Smoker  . Smokeless tobacco: Never Used  Substance and Sexual Activity  . Alcohol use: No  . Drug use: No  . Sexual activity: Yes  Other Topics Concern  . Not on file  Social History Narrative  . Not on file   Social Determinants of Health   Financial Resource Strain: Not on file  Food Insecurity: Not on file  Transportation Needs: Not on file  Physical Activity: Not on file  Stress: Not on file  Social Connections: Not on file  Intimate Partner Violence: Not on file    Family History  Problem Relation Age of Onset  . Hypothyroidism Father   . Asthma Other   . Diabetes Other   . Allergy (severe) Other   . Hypothyroidism Mother   . Hypothyroidism Sister   . Hypothyroidism Brother     BP 128/64   Ht 5\' 10"  (1.778 m)   Wt 150 lb (68 kg)   BMI 21.52 kg/m   [facility] 12/22/2019 01/31/2020 02/14/2020 03/15/2020  Frequency of aerobic exercise (# of days/week) 7 7 6 6   Average time in minutes 90 90 75 75  Frequency of strengthening activities (# of days/week) 3 1 0 0    No flowsheet data found.  Review of Systems: See HPI above.     Objective:  Physical Exam:  Gen: NAD, comfortable in exam room  Left ankle/lower leg: No gross deformity, swelling, ecchymoses FROM ankle without pain. TTP medial tibia between distal and middle 1/3rd. Negative syndesmotic compression. Thompsons test negative. NV intact distally. Mild pain with hop test.   Assessment & Plan:  1. Left distal tibia stress fracture - still dealing with issues despite over 3 months conservative treatment.  Limited MSK u/s today shows callus formation with some neovascularity but would expect his pain to be much better than it is currently at this stage.  Advised at this point we go ahead with CT  without contrast to assess level of healing, persistent fracture, for nonunion.  No running in meantime.  Consider possibility intermittent running is setting back healing of this and may need a more prolonged pure rest period depending on CT results.

## 2020-05-09 ENCOUNTER — Telehealth: Payer: Self-pay

## 2020-05-09 NOTE — Telephone Encounter (Signed)
Patient called wanting to schedule surgery.  Need surgery sheet/order.  Thanks!

## 2020-05-10 ENCOUNTER — Ambulatory Visit: Payer: BC Managed Care – PPO | Admitting: Internal Medicine

## 2020-05-10 NOTE — Telephone Encounter (Signed)
done

## 2020-05-11 ENCOUNTER — Ambulatory Visit
Admission: RE | Admit: 2020-05-11 | Discharge: 2020-05-11 | Disposition: A | Discharge status: Home / Self Care | Payer: BC Managed Care – PPO | Source: Ambulatory Visit | Attending: Family Medicine | Admitting: Family Medicine

## 2020-05-11 DIAGNOSIS — S82392D Other fracture of lower end of left tibia, subsequent encounter for closed fracture with routine healing: Secondary | ICD-10-CM | POA: Diagnosis not present

## 2020-05-11 DIAGNOSIS — M25562 Pain in left knee: Secondary | ICD-10-CM

## 2020-05-11 DIAGNOSIS — M7989 Other specified soft tissue disorders: Secondary | ICD-10-CM | POA: Diagnosis not present

## 2020-05-11 NOTE — Telephone Encounter (Signed)
I called patient and scheduled surgery. 

## 2020-05-16 ENCOUNTER — Telehealth: Payer: Self-pay | Admitting: Orthopaedic Surgery

## 2020-05-16 NOTE — Telephone Encounter (Signed)
I called patient and answered questions.

## 2020-05-16 NOTE — Telephone Encounter (Signed)
Pt called asking for a CB in regards to his procedure 02-Jun-2020 coded [id number]  [phone]

## 2020-05-17 ENCOUNTER — Encounter (HOSPITAL_BASED_OUTPATIENT_CLINIC_OR_DEPARTMENT_OTHER): Payer: Self-pay | Admitting: Orthopaedic Surgery

## 2020-05-17 ENCOUNTER — Other Ambulatory Visit: Payer: Self-pay

## 2020-05-18 DIAGNOSIS — J3081 Allergic rhinitis due to animal (cat) (dog) hair and dander: Secondary | ICD-10-CM | POA: Diagnosis not present

## 2020-05-18 DIAGNOSIS — J301 Allergic rhinitis due to pollen: Secondary | ICD-10-CM | POA: Diagnosis not present

## 2020-05-18 DIAGNOSIS — J3089 Other allergic rhinitis: Secondary | ICD-10-CM | POA: Diagnosis not present

## 2020-05-22 ENCOUNTER — Other Ambulatory Visit (HOSPITAL_COMMUNITY): Payer: BC Managed Care – PPO | Attending: Orthopaedic Surgery

## 2020-05-23 NOTE — Progress Notes (Signed)
Texted patient to come at 9am for covid test instead of 11am as previously scheduled.

## 2020-05-24 ENCOUNTER — Other Ambulatory Visit: Payer: Self-pay

## 2020-05-24 ENCOUNTER — Ambulatory Visit (HOSPITAL_BASED_OUTPATIENT_CLINIC_OR_DEPARTMENT_OTHER): Payer: BC Managed Care – PPO | Admitting: Anesthesiology

## 2020-05-24 ENCOUNTER — Ambulatory Visit (HOSPITAL_BASED_OUTPATIENT_CLINIC_OR_DEPARTMENT_OTHER)
Admission: RE | Admit: 2020-05-24 | Discharge: 2020-05-24 | Disposition: A | Discharge status: Home / Self Care | Payer: BC Managed Care – PPO | Attending: Orthopaedic Surgery | Admitting: Orthopaedic Surgery

## 2020-05-24 ENCOUNTER — Encounter (HOSPITAL_BASED_OUTPATIENT_CLINIC_OR_DEPARTMENT_OTHER): Payer: Self-pay | Admitting: Orthopaedic Surgery

## 2020-05-24 ENCOUNTER — Encounter (HOSPITAL_BASED_OUTPATIENT_CLINIC_OR_DEPARTMENT_OTHER)
Admission: RE | Disposition: A | Discharge status: Home / Self Care | Payer: Self-pay | Source: Home / Self Care | Attending: Orthopaedic Surgery

## 2020-05-24 DIAGNOSIS — Z20822 Contact with and (suspected) exposure to covid-19: Secondary | ICD-10-CM | POA: Insufficient documentation

## 2020-05-24 DIAGNOSIS — M19019 Primary osteoarthritis, unspecified shoulder: Secondary | ICD-10-CM | POA: Diagnosis not present

## 2020-05-24 DIAGNOSIS — Z881 Allergy status to other antibiotic agents status: Secondary | ICD-10-CM | POA: Insufficient documentation

## 2020-05-24 DIAGNOSIS — L089 Local infection of the skin and subcutaneous tissue, unspecified: Secondary | ICD-10-CM | POA: Diagnosis not present

## 2020-05-24 DIAGNOSIS — S60351A Superficial foreign body of right thumb, initial encounter: Secondary | ICD-10-CM | POA: Diagnosis not present

## 2020-05-24 DIAGNOSIS — E039 Hypothyroidism, unspecified: Secondary | ICD-10-CM | POA: Diagnosis not present

## 2020-05-24 DIAGNOSIS — M79644 Pain in right finger(s): Secondary | ICD-10-CM | POA: Insufficient documentation

## 2020-05-24 DIAGNOSIS — Z888 Allergy status to other drugs, medicaments and biological substances status: Secondary | ICD-10-CM | POA: Insufficient documentation

## 2020-05-24 DIAGNOSIS — Z79899 Other long term (current) drug therapy: Secondary | ICD-10-CM | POA: Insufficient documentation

## 2020-05-24 DIAGNOSIS — G709 Myoneural disorder, unspecified: Secondary | ICD-10-CM | POA: Diagnosis not present

## 2020-05-24 DIAGNOSIS — Z79890 Hormone replacement therapy (postmenopausal): Secondary | ICD-10-CM | POA: Insufficient documentation

## 2020-05-24 HISTORY — PX: I & D EXTREMITY: SHX5045

## 2020-05-24 LAB — SARS CORONAVIRUS 2 BY RT PCR (HOSPITAL ORDER, PERFORMED IN ~~LOC~~ HOSPITAL LAB): SARS Coronavirus 2: NEGATIVE

## 2020-05-24 SURGERY — IRRIGATION AND DEBRIDEMENT EXTREMITY
Anesthesia: Monitor Anesthesia Care | Site: Thumb | Laterality: Right

## 2020-05-24 MED ORDER — PROPOFOL 500 MG/50ML IV EMUL
INTRAVENOUS | Status: DC | PRN
  Administered 2020-05-24: 100 ug/kg/min via INTRAVENOUS

## 2020-05-24 MED ORDER — MIDAZOLAM HCL 5 MG/5ML IJ SOLN
INTRAMUSCULAR | Status: DC | PRN
  Administered 2020-05-24: 2 mg via INTRAVENOUS

## 2020-05-24 MED ORDER — ACETAMINOPHEN 325 MG PO TABS: 325.0000 mg | ORAL_TABLET | ORAL | Status: DC | PRN

## 2020-05-24 MED ORDER — LIDOCAINE HCL (CARDIAC) PF 100 MG/5ML IV SOSY
PREFILLED_SYRINGE | INTRAVENOUS | Status: DC | PRN
  Administered 2020-05-24: 60 mg via INTRAVENOUS

## 2020-05-24 MED ORDER — MIDAZOLAM HCL 2 MG/2ML IJ SOLN
INTRAMUSCULAR | Status: AC
  Filled 2020-05-24: qty 2

## 2020-05-24 MED ORDER — CEFAZOLIN SODIUM-DEXTROSE 2-3 GM-%(50ML) IV SOLR
INTRAVENOUS | Status: DC | PRN
  Administered 2020-05-24: 2 g via INTRAVENOUS

## 2020-05-24 MED ORDER — LACTATED RINGERS IV SOLN: INTRAVENOUS | Status: DC

## 2020-05-24 MED ORDER — OXYCODONE HCL 5 MG PO TABS: 5.0000 mg | ORAL_TABLET | Freq: Once | ORAL | Status: DC | PRN

## 2020-05-24 MED ORDER — ONDANSETRON HCL 4 MG/2ML IJ SOLN: 4.0000 mg | Freq: Once | INTRAMUSCULAR | Status: DC | PRN

## 2020-05-24 MED ORDER — ACETAMINOPHEN 160 MG/5ML PO SOLN: 325.0000 mg | ORAL | Status: DC | PRN

## 2020-05-24 MED ORDER — LIDOCAINE HCL (PF) 1 % IJ SOLN
INTRAMUSCULAR | Status: DC | PRN
  Administered 2020-05-24: 4 mL

## 2020-05-24 MED ORDER — MUPIROCIN 2 % EX OINT: 1.0000 "application " | TOPICAL_OINTMENT | Freq: Two times a day (BID) | CUTANEOUS | 2 refills | Status: DC

## 2020-05-24 MED ORDER — FENTANYL CITRATE (PF) 100 MCG/2ML IJ SOLN
INTRAMUSCULAR | Status: DC | PRN
  Administered 2020-05-24: 50 ug via INTRAVENOUS

## 2020-05-24 MED ORDER — MUPIROCIN 2 % EX OINT
TOPICAL_OINTMENT | CUTANEOUS | Status: DC | PRN
  Administered 2020-05-24: 1 via TOPICAL

## 2020-05-24 MED ORDER — FENTANYL CITRATE (PF) 100 MCG/2ML IJ SOLN
INTRAMUSCULAR | Status: AC
  Filled 2020-05-24: qty 2

## 2020-05-24 MED ORDER — HYDROCODONE-ACETAMINOPHEN 5-325 MG PO TABS: 1.0000 | ORAL_TABLET | Freq: Four times a day (QID) | ORAL | 0 refills | Status: DC | PRN

## 2020-05-24 MED ORDER — BUPIVACAINE HCL 0.25 % IJ SOLN
INTRAMUSCULAR | Status: DC | PRN
  Administered 2020-05-24: 4 mL

## 2020-05-24 MED ORDER — FENTANYL CITRATE (PF) 100 MCG/2ML IJ SOLN: 25.0000 ug | INTRAMUSCULAR | Status: DC | PRN

## 2020-05-24 MED ORDER — OXYCODONE HCL 5 MG/5ML PO SOLN: 5.0000 mg | Freq: Once | ORAL | Status: DC | PRN

## 2020-05-24 MED ORDER — MEPERIDINE HCL 25 MG/ML IJ SOLN: 6.2500 mg | INTRAMUSCULAR | Status: DC | PRN

## 2020-05-24 SURGICAL SUPPLY — 58 items
ADH SKN CLS APL DERMABOND .7 (GAUZE/BANDAGES/DRESSINGS)
BAND INSRT 18 STRL LF DISP RB (MISCELLANEOUS) ×1
BAND RUBBER #18 3X1/16 STRL (MISCELLANEOUS) ×2 IMPLANT
BLADE SURG 15 STRL LF DISP TIS (BLADE) ×2 IMPLANT
BLADE SURG 15 STRL SS (BLADE) ×4
BNDG CMPR 9X4 STRL LF SNTH (GAUZE/BANDAGES/DRESSINGS)
BNDG COHESIVE 1X5 TAN STRL LF (GAUZE/BANDAGES/DRESSINGS) ×1 IMPLANT
BNDG CONFORM 2 STRL LF (GAUZE/BANDAGES/DRESSINGS) ×2 IMPLANT
BNDG ELASTIC 3X5.8 VLCR STR LF (GAUZE/BANDAGES/DRESSINGS) IMPLANT
BNDG ESMARK 4X9 LF (GAUZE/BANDAGES/DRESSINGS) ×1 IMPLANT
BRUSH SCRUB EZ PLAIN DRY (MISCELLANEOUS) ×2 IMPLANT
CORD BIPOLAR FORCEPS 12FT (ELECTRODE) ×2 IMPLANT
COVER BACK TABLE 60X90IN (DRAPES) ×2 IMPLANT
COVER MAYO STAND STRL (DRAPES) ×2 IMPLANT
COVER WAND RF STERILE (DRAPES) IMPLANT
CUFF TOURN SGL QUICK 18X4 (TOURNIQUET CUFF) ×1 IMPLANT
DECANTER SPIKE VIAL GLASS SM (MISCELLANEOUS) IMPLANT
DERMABOND ADVANCED (GAUZE/BANDAGES/DRESSINGS)
DERMABOND ADVANCED .7 DNX12 (GAUZE/BANDAGES/DRESSINGS) IMPLANT
DRAPE EXTREMITY T 121X128X90 (DISPOSABLE) ×2 IMPLANT
DRAPE IMP U-DRAPE 54X76 (DRAPES) ×2 IMPLANT
DRAPE SURG 17X23 STRL (DRAPES) ×2 IMPLANT
GAUZE 4X4 16PLY RFD (DISPOSABLE) IMPLANT
GAUZE SPONGE 4X4 12PLY STRL (GAUZE/BANDAGES/DRESSINGS) ×2 IMPLANT
GAUZE XEROFORM 1X8 LF (GAUZE/BANDAGES/DRESSINGS) ×2 IMPLANT
GLOVE SURG LTX SZ7 (GLOVE) ×2 IMPLANT
GLOVE SURG NEOP MICRO LF SZ7.5 (GLOVE) ×2 IMPLANT
GLOVE SURG SYN 7.5  E (GLOVE) ×4
GLOVE SURG SYN 7.5 E (GLOVE) ×2 IMPLANT
GLOVE SURG SYN 7.5 PF PI (GLOVE) ×2 IMPLANT
GLOVE SURG UNDER POLY LF SZ7 (GLOVE) ×4 IMPLANT
GOWN STRL REIN XL XLG (GOWN DISPOSABLE) ×3 IMPLANT
GOWN STRL REUS W/ TWL LRG LVL3 (GOWN DISPOSABLE) ×1 IMPLANT
GOWN STRL REUS W/ TWL XL LVL3 (GOWN DISPOSABLE) ×2 IMPLANT
GOWN STRL REUS W/TWL LRG LVL3 (GOWN DISPOSABLE) ×2
GOWN STRL REUS W/TWL XL LVL3 (GOWN DISPOSABLE) ×4
MANIFOLD NEPTUNE II (INSTRUMENTS) ×1 IMPLANT
NDL HYPO 25X1 1.5 SAFETY (NEEDLE) IMPLANT
NEEDLE HYPO 25X1 1.5 SAFETY (NEEDLE) ×2 IMPLANT
NS IRRIG 1000ML POUR BTL (IV SOLUTION) ×1 IMPLANT
PACK BASIN DAY SURGERY FS (CUSTOM PROCEDURE TRAY) ×2 IMPLANT
SHEET MEDIUM DRAPE 40X70 STRL (DRAPES) ×2 IMPLANT
STOCKINETTE 4X48 STRL (DRAPES) IMPLANT
SUCTION FRAZIER HANDLE 10FR (MISCELLANEOUS) ×2
SUCTION TUBE FRAZIER 10FR DISP (MISCELLANEOUS) IMPLANT
SUT ETHILON 2 0 FS 18 (SUTURE) IMPLANT
SUT ETHILON 4 0 PS 2 18 (SUTURE) ×1 IMPLANT
SUT VIC AB 0 CT1 27 (SUTURE)
SUT VIC AB 0 CT1 27XBRD ANBCTR (SUTURE) IMPLANT
SUT VIC AB 2-0 CT1 27 (SUTURE)
SUT VIC AB 2-0 CT1 TAPERPNT 27 (SUTURE) IMPLANT
SWAB COLLECTION DEVICE MRSA (MISCELLANEOUS) ×1 IMPLANT
SWAB CULTURE ESWAB REG 1ML (MISCELLANEOUS) ×1 IMPLANT
SYR BULB EAR ULCER 3OZ GRN STR (SYRINGE) IMPLANT
SYR CONTROL 10ML LL (SYRINGE) ×1 IMPLANT
TOWEL GREEN STERILE FF (TOWEL DISPOSABLE) ×2 IMPLANT
TRAY DSU PREP LF (CUSTOM PROCEDURE TRAY) ×2 IMPLANT
TUBE CONNECTING 20X1/4 (TUBING) ×1 IMPLANT

## 2020-05-24 NOTE — Op Note (Signed)
   Date of Surgery: 05/24/2020  INDICATIONS: Joseph Santana is a 65 y.o.-year-old male with chronic right thumb pain and questionable fungal infection from gardening.  The patient did consent to the procedure after discussion of the risks and benefits.  PREOPERATIVE DIAGNOSIS: Questionable chronic right thumb fungal infection  POSTOPERATIVE DIAGNOSIS: Same.  PROCEDURE:  1. Marsupialization of right thumb  SURGEON: Joseph Santana, M.D.  ASSIST: Joseph Santana, Joseph Santana; necessary for the timely completion of procedure and due to complexity of procedure.  ANESTHESIA:  Local, MAC  IV FLUIDS AND URINE: See anesthesia.  ESTIMATED BLOOD LOSS: minimal mL.  IMPLANTS: none  DRAINS: none  COMPLICATIONS: see description of procedure.  DESCRIPTION OF PROCEDURE: The patient was brought to the operating room.  The patient had been signed prior to the procedure and this was documented. The patient had the anesthesia placed by the anesthesiologist.  A time-out was performed to confirm that this was the correct patient, site, side and location.  The patient had the operative extremity prepped and draped in the standard surgical fashion.    A sterile Penrose drain was used as a tourniquet at the base of the thumb.  These surgical incision was marked out and a semilunar piece of skin proximal to the cuticle was incised and excised in a marsupialization fashion.  Once we got under the dermis there was a significant amount of clear gelatinous fluid similar to fluid found in a ganglion cyst or tendon sheath.  Cultures were taken for bacteria, fungus, AFB.  The tissue was also sent for bacterial, fungal, AFB cultures.  There is no evidence of chronic infection or inflammation.  This was thoroughly irrigated.  Hemostasis was obtained.  The surgical wound was left open.  Mupirocin ointment was placed in the surgical site and sterile dressings were applied.  Patient tolerated the procedure well no me  complications.  Joseph Santana was necessary for opening, closing, retracting, limb positioning and overall facilitation and timely completion of the procedure.  POSTOPERATIVE PLAN: Patient will do dressing changes twice a day until the wound is healed.  Follow-up in the office in 1 week.  Joseph Cecil, MD 2:36 PM

## 2020-05-24 NOTE — Discharge Instructions (Signed)
°  Post Anesthesia Home Care Instructions  Activity: Get plenty of rest for the remainder of the day. A responsible individual must stay with you for 24 hours following the procedure.  For the next 24 hours, DO NOT: -Drive a car -Advertising copywriter -Drink alcoholic beverages -Take any medication unless instructed by your physician -Make any legal decisions or sign important papers.  Meals: Start with liquid foods such as gelatin or soup. Progress to regular foods as tolerated. Avoid greasy, spicy, heavy foods. If nausea and/or vomiting occur, drink only clear liquids until the nausea and/or vomiting subsides. Call your physician if vomiting continues.  Special Instructions/Symptoms: Your throat may feel dry or sore from the anesthesia or the breathing tube placed in your throat during surgery. If this causes discomfort, gargle with warm salt water. The discomfort should disappear within 24 hours.  If you had a scopolamine patch placed behind your ear for the management of post- operative nausea and/or vomiting:  1. The medication in the patch is effective for 72 hours, after which it should be removed.  Wrap patch in a tissue and discard in the trash. Wash hands thoroughly with soap and water. 2. You may remove the patch earlier than 72 hours if you experience unpleasant side effects which may include dry mouth, dizziness or visual disturbances. 3. Avoid touching the patch. Wash your hands with soap and water after contact with the patch.          Postoperative instructions:  Keep your dressing and/or splint clean and dry at all times.  You can remove your dressing on post-operative day #1 and change twice a day with mupirocin ointment and sterile gauze and coban.    Pain control:  You have been given a prescription to be taken as directed for post-operative pain control.  In addition, elevate the operative extremity above the heart at all times to prevent swelling and throbbing  pain.  Take over-the-counter Colace, 100mg  by mouth twice a day while taking narcotic pain medications to help prevent constipation.  Follow up appointments: 1) 7 days for suture removal and wound check. 2) Dr. as scheduled.   ------------------------------------------------------------------------------------------------------------

## 2020-05-24 NOTE — Anesthesia Postprocedure Evaluation (Signed)
Anesthesia Post Note  Patient: Joseph Santana  Procedure(s) Performed: IRRIGATION AND DEBRIDEMENT RIGHT THUMB (Right Thumb)     Patient location during evaluation: PACU Anesthesia Type: MAC Level of consciousness: awake and alert Pain management: pain level controlled Vital Signs Assessment: post-procedure vital signs reviewed and stable Respiratory status: spontaneous breathing, nonlabored ventilation, respiratory function stable and patient connected to nasal cannula oxygen Cardiovascular status: stable and blood pressure returned to baseline Postop Assessment: no apparent nausea or vomiting Anesthetic complications: no   No complications documented.  Last Vitals:  Vitals:   05/24/20 1445 05/24/20 1500  BP: (!) 99/58 116/64  Pulse: (!) 51 (!) 49  Resp: 10 11  Temp:    SpO2: 100% 100%    Last Pain:  Vitals:   05/24/20 1500  TempSrc:   PainSc: 0-No pain                 Arbell Wycoff

## 2020-05-24 NOTE — Anesthesia Preprocedure Evaluation (Signed)
Anesthesia Evaluation  Patient identified by MRN, date of birth, ID band Patient awake    Reviewed: Allergy & Precautions, H&P , NPO status , Patient's Chart, lab work & pertinent test results, reviewed documented beta blocker date and time   Airway Mallampati: II  TM Distance: >3 FB Neck ROM: full    Dental no notable dental hx.    Pulmonary neg pulmonary ROS,    Pulmonary exam normal breath sounds clear to auscultation       Cardiovascular Exercise Tolerance: Good negative cardio ROS   Rhythm:regular Rate:Normal     Neuro/Psych  Neuromuscular disease negative psych ROS   GI/Hepatic negative GI ROS, Neg liver ROS,   Endo/Other  Hypothyroidism   Renal/GU negative Renal ROS  negative genitourinary   Musculoskeletal  (+) Arthritis , Osteoarthritis,    Abdominal   Peds  Hematology negative hematology ROS (+)   Anesthesia Other Findings   Reproductive/Obstetrics negative OB ROS                             Anesthesia Physical Anesthesia Plan  ASA: II  Anesthesia Plan: MAC   Post-op Pain Management:    Induction: Intravenous  PONV Risk Score and Plan:   Airway Management Planned: Mask and Natural Airway  Additional Equipment:   Intra-op Plan:   Post-operative Plan:   Informed Consent: I have reviewed the patients History and Physical, chart, labs and discussed the procedure including the risks, benefits and alternatives for the proposed anesthesia with the patient or authorized representative who has indicated his/her understanding and acceptance.     Dental Advisory Given  Plan Discussed with: CRNA  Anesthesia Plan Comments:         Anesthesia Quick Evaluation

## 2020-05-24 NOTE — H&P (Signed)
PREOPERATIVE H&P  Chief Complaint: right thumb infection  HPI: Joseph Santana is a 65 y.o. male who presents for surgical treatment of right thumb infection.  He denies any changes in medical history.  Past Medical History:  Diagnosis Date  . ALLERGIC REACTION 07/22/2009  . ALLERGIC RHINITIS 10/08/2006  . B12 DEFICIENCY 05/14/2010  . Carpal tunnel syndrome 12/31/2007  . Degeneration of cervical intervertebral disc 12/31/2007  . HYPERLIPIDEMIA 10/08/2006  . HYPOTHYROIDISM 10/08/2006  . Irritable bowel syndrome 03/03/2008  . LUMBAR RADICULOPATHY, RIGHT 12/31/2006  . OSTEOARTHRITIS, LOWER LEG, LEFT 05/10/2009  . SINUSITIS - ACUTE-NOS 07/22/2009  . TESTICULAR HYPOFUNCTION 06/26/2009   Past Surgical History:  Procedure Laterality Date  . BACK SURGERY    . KNEE SURGERY    . LUMBAR LAMINECTOMY  2008   with revision  . SPINAL FUSION  2011  . spinal fusion revision  2012   Social History   Socioeconomic History  . Marital status: Married    Spouse name: Not on file  . Number of children: Not on file  . Years of education: Not on file  . Highest education level: Not on file  Occupational History  . Not on file  Tobacco Use  . Smoking status: Never Smoker  . Smokeless tobacco: Never Used  Substance and Sexual Activity  . Alcohol use: No  . Drug use: No  . Sexual activity: Yes  Other Topics Concern  . Not on file  Social History Narrative  . Not on file   Social Determinants of Health   Financial Resource Strain: Not on file  Food Insecurity: Not on file  Transportation Needs: Not on file  Physical Activity: Not on file  Stress: Not on file  Social Connections: Not on file   Family History  Problem Relation Age of Onset  . Hypothyroidism Father   . Asthma Other   . Diabetes Other   . Allergy (severe) Other   . Hypothyroidism Mother   . Hypothyroidism Sister   . Hypothyroidism Brother    Allergies  Allergen Reactions  . Iodine     IVP dye   . Levofloxacin     Prior to Admission medications   Medication Sig Start Date End Date Taking? Authorizing Provider  JATENZO 198 MG CAPS Take 1 capsule at breakfast and dinner daily 02/15/20  Yes Reather Littler, MD  levothyroxine (SYNTHROID) 150 MCG tablet TAKE 1 TABLET BY MOUTH 30 MINUTES BEFORE BREAKFAST EVERY DAY Patient taking differently: 162.5 mcg. 05/04/20  Yes Reather Littler, MD  levothyroxine (SYNTHROID) 25 MCG tablet Take 1 tablet daily 03/27/20  Yes Reather Littler, MD  montelukast (SINGULAIR) 10 MG tablet Take 10 mg by mouth at bedtime. 07/06/19  Yes [provider]  Multiple Vitamin (MULTIVITAMIN) capsule Take 1 capsule by mouth daily.   Yes [provider]  Olopatadine HCl 0.6 % SOLN  11/10/17  Yes [provider]  Triamcinolone Acetonide (NASACORT ALLERGY 24HR NA) Place into the nose.   Yes [provider]  EPINEPHrine 0.3 mg/0.3 mL IJ SOAJ injection EpiPen 2-Pak 0.3 mg/0.3 mL injection, auto-injector    [provider]  nitroGLYCERIN (NITRODUR - DOSED IN MG/24 HR) 0.2 mg/hr patch Place 1/4 patch to the affected area daily. 01/03/20   Enid Baas, MD     Positive ROS: All other systems have been reviewed and were otherwise negative with the exception of those mentioned in the HPI and as above.  Physical Exam: General: Alert, no acute distress Cardiovascular:  No pedal edema Respiratory: No cyanosis, no use of accessory musculature GI: abdomen soft Skin: No lesions in the area of chief complaint Neurologic: Sensation intact distally Psychiatric: Patient is competent for consent with normal mood and affect Lymphatic: no lymphedema  MUSCULOSKELETAL: exam stable  Assessment: right thumb infection  Plan: Plan for Procedure(s): IRRIGATION AND DEBRIDEMENT RIGHT THUMB  The risks benefits and alternatives were discussed with the patient including but not limited to the risks of nonoperative treatment, versus surgical intervention including infection, bleeding,  nerve injury,  blood clots, cardiopulmonary complications, morbidity, mortality, among others, and they were willing to proceed.   Preoperative templating of the joint replacement has been completed, documented, and submitted to the Operating Room personnel in order to optimize intra-operative equipment management.   Glee Arvin, MD 05/24/2020 1:55 PM

## 2020-05-24 NOTE — Anesthesia Procedure Notes (Signed)
Procedure Name: MAC Date/Time: 05/24/2020 2:29 PM Performed by: Signe Colt, CRNA Pre-anesthesia Checklist: Patient identified, Emergency Drugs available, Suction available, Patient being monitored and Timeout performed Patient Re-evaluated:Patient Re-evaluated prior to induction Oxygen Delivery Method: Simple face mask

## 2020-05-24 NOTE — Transfer of Care (Signed)
Immediate Anesthesia Transfer of Care Note  Patient: Joseph Santana  Procedure(s) Performed: IRRIGATION AND DEBRIDEMENT RIGHT THUMB (Right )  Patient Location: PACU  Anesthesia Type:MAC  Level of Consciousness: awake, alert , oriented and patient cooperative  Airway & Oxygen Therapy: Patient Spontanous Breathing and Patient connected to face mask oxygen  Post-op Assessment: Report given to RN and Post -op Vital signs reviewed and stable  Post vital signs: Reviewed and stable  Last Vitals:  Vitals Value Taken Time  BP    Temp    Pulse 51 05/24/20 1441  Resp    SpO2 100 % 05/24/20 1441  Vitals shown include unvalidated device data.  Last Pain:  Vitals:   05/24/20 1307  TempSrc: Oral  PainSc: 0-No pain      Patients Stated Pain Goal: 4 (57/32/20 2542)  Complications: No complications documented.

## 2020-05-25 ENCOUNTER — Encounter (HOSPITAL_BASED_OUTPATIENT_CLINIC_OR_DEPARTMENT_OTHER): Payer: Self-pay | Admitting: Orthopaedic Surgery

## 2020-05-25 DIAGNOSIS — J3081 Allergic rhinitis due to animal (cat) (dog) hair and dander: Secondary | ICD-10-CM | POA: Diagnosis not present

## 2020-05-25 DIAGNOSIS — J3089 Other allergic rhinitis: Secondary | ICD-10-CM | POA: Diagnosis not present

## 2020-05-25 DIAGNOSIS — J301 Allergic rhinitis due to pollen: Secondary | ICD-10-CM | POA: Diagnosis not present

## 2020-05-25 LAB — ACID FAST SMEAR (AFB, MYCOBACTERIA): Acid Fast Smear: NEGATIVE

## 2020-05-29 LAB — AEROBIC/ANAEROBIC CULTURE W GRAM STAIN (SURGICAL/DEEP WOUND)
Culture: NO GROWTH
Culture: NO GROWTH
Gram Stain: NONE SEEN

## 2020-05-31 ENCOUNTER — Ambulatory Visit (INDEPENDENT_AMBULATORY_CARE_PROVIDER_SITE_OTHER): Payer: BC Managed Care – PPO | Admitting: Orthopaedic Surgery

## 2020-05-31 ENCOUNTER — Encounter: Payer: Self-pay | Admitting: Orthopaedic Surgery

## 2020-05-31 DIAGNOSIS — L089 Local infection of the skin and subcutaneous tissue, unspecified: Secondary | ICD-10-CM

## 2020-05-31 NOTE — Progress Notes (Signed)
   Post-Op Visit Note   Patient: Joseph Santana           Date of Birth: 1956/03/15           MRN: [id number] Visit Date: 05/31/2020 PCP: Corwin Levins, MD   Assessment & Plan:  Chief Complaint:  Chief Complaint  Patient presents with  . Right Thumb - Routine Post Op   Visit Diagnoses:  1. Infection of thumb     Plan:   Joseph Santana is 1 week status post marsupialization of the right thumb concerning for chronic fungal infection.  So far the bacterial and fungal and AFB cultures are negative.  He is feeling fine.  He has been treating this with mupirocin ointment twice a day with a Band-Aid.  Right thumb is healing from the marsupialization no signs of infection.  No neurovascular compromise.  He will continue the mupirocin ointment and Band-Aid.  Follow-up in 2 weeks for clinical recheck.  In the meantime I will continue to look out for the cultures.  Follow-Up Instructions: Return in about 2 weeks (around 06/14/2020).   Orders:  No orders of the defined types were placed in this encounter.  No orders of the defined types were placed in this encounter.   Imaging: No results found.  PMFS History: Patient Active Problem List   Diagnosis Date Noted  . Infection of thumb 05/24/2020  . Foreign body of right thumb 04/21/2020  . Groin pain 12/22/2019  . Quadriceps tendinitis 06/07/2019  . Osteoarthritis, shoulder 06/07/2019  . Left foot pain 07/10/2017  . Hypogonadism in male 06/26/2009  . Hypothyroidism 10/08/2006   Past Medical History:  Diagnosis Date  . ALLERGIC REACTION 07/22/2009  . ALLERGIC RHINITIS 10/08/2006  . B12 DEFICIENCY 05/14/2010  . Carpal tunnel syndrome 12/31/2007  . Degeneration of cervical intervertebral disc 12/31/2007  . HYPERLIPIDEMIA 10/08/2006  . HYPOTHYROIDISM 10/08/2006  . Irritable bowel syndrome 03/03/2008  . LUMBAR RADICULOPATHY, RIGHT 12/31/2006  . OSTEOARTHRITIS, LOWER LEG, LEFT 05/10/2009  . SINUSITIS - ACUTE-NOS 07/22/2009  . TESTICULAR  HYPOFUNCTION 06/26/2009    Family History  Problem Relation Age of Onset  . Hypothyroidism Father   . Asthma Other   . Diabetes Other   . Allergy (severe) Other   . Hypothyroidism Mother   . Hypothyroidism Sister   . Hypothyroidism Brother     Past Surgical History:  Procedure Laterality Date  . BACK SURGERY    . I & D EXTREMITY Right 05/24/2020   Procedure: IRRIGATION AND DEBRIDEMENT RIGHT THUMB;  Surgeon: Tarry Kos, MD;  Location: [facility];  Service: Orthopedics;  Laterality: Right;  . KNEE SURGERY    . LUMBAR LAMINECTOMY  2008   with revision  . SPINAL FUSION  2011  . spinal fusion revision  2012   Social History   Occupational History  . Not on file  Tobacco Use  . Smoking status: Never Smoker  . Smokeless tobacco: Never Used  Substance and Sexual Activity  . Alcohol use: No  . Drug use: No  . Sexual activity: Yes

## 2020-06-01 DIAGNOSIS — J301 Allergic rhinitis due to pollen: Secondary | ICD-10-CM | POA: Diagnosis not present

## 2020-06-01 DIAGNOSIS — J3089 Other allergic rhinitis: Secondary | ICD-10-CM | POA: Diagnosis not present

## 2020-06-01 DIAGNOSIS — J3081 Allergic rhinitis due to animal (cat) (dog) hair and dander: Secondary | ICD-10-CM | POA: Diagnosis not present

## 2020-06-07 ENCOUNTER — Other Ambulatory Visit: Payer: Self-pay | Admitting: Endocrinology

## 2020-06-07 DIAGNOSIS — E230 Hypopituitarism: Secondary | ICD-10-CM

## 2020-06-07 DIAGNOSIS — E039 Hypothyroidism, unspecified: Secondary | ICD-10-CM

## 2020-06-13 ENCOUNTER — Other Ambulatory Visit (INDEPENDENT_AMBULATORY_CARE_PROVIDER_SITE_OTHER): Payer: BC Managed Care – PPO

## 2020-06-13 ENCOUNTER — Other Ambulatory Visit: Payer: Self-pay

## 2020-06-13 DIAGNOSIS — E039 Hypothyroidism, unspecified: Secondary | ICD-10-CM

## 2020-06-13 DIAGNOSIS — E230 Hypopituitarism: Secondary | ICD-10-CM

## 2020-06-13 LAB — T4, FREE: Free T4: 1.12 ng/dL (ref 0.60–1.60)

## 2020-06-13 LAB — TESTOSTERONE: Testosterone: 531.52 ng/dL (ref 300.00–890.00)

## 2020-06-13 LAB — CBC
HCT: 44.7 % (ref 39.0–52.0)
Hemoglobin: 15.4 g/dL (ref 13.0–17.0)
MCHC: 34.5 g/dL (ref 30.0–36.0)
MCV: 89.4 fl (ref 78.0–100.0)
Platelets: 264 10*3/uL (ref 150.0–400.0)
RBC: 5 Mil/uL (ref 4.22–5.81)
RDW: 13 % (ref 11.5–15.5)
WBC: 5.3 10*3/uL (ref 4.0–10.5)

## 2020-06-13 LAB — TSH: TSH: 3.11 u[IU]/mL (ref 0.35–4.50)

## 2020-06-15 ENCOUNTER — Ambulatory Visit (INDEPENDENT_AMBULATORY_CARE_PROVIDER_SITE_OTHER): Payer: BC Managed Care – PPO | Admitting: Physician Assistant

## 2020-06-15 ENCOUNTER — Other Ambulatory Visit: Payer: Self-pay | Admitting: Physician Assistant

## 2020-06-15 ENCOUNTER — Encounter: Payer: Self-pay | Admitting: Orthopaedic Surgery

## 2020-06-15 DIAGNOSIS — L089 Local infection of the skin and subcutaneous tissue, unspecified: Secondary | ICD-10-CM

## 2020-06-15 MED ORDER — MUPIROCIN 2 % EX OINT: 1.0000 "application " | TOPICAL_OINTMENT | Freq: Two times a day (BID) | CUTANEOUS | 2 refills | Status: DC

## 2020-06-15 NOTE — Progress Notes (Signed)
   Post-Op Visit Note   Patient: Joseph Santana           Date of Birth: 11/20/1955           MRN: [id number] Visit Date: 06/15/2020 PCP: Corwin Levins, MD   Assessment & Plan:  Chief Complaint:  Chief Complaint  Patient presents with  . Right Thumb - Pain   Visit Diagnoses:  1. Infection of thumb     Plan: Patient is a pleasant 65 year old gentleman who comes in today 3 weeks out right thumb I&D.  He has been doing well.  He has been doing twice daily mupirocin bandage changes.  No fevers or chills.  Bacterial, fungal and AFB are negative.  Examination of the right thumb shows a nearly healed wound without signs of infection.  Fingers are warm and well-perfused.  He is neurovascular intact distally.  At this point, he will continue with the mupirocin plus Band-Aid dressing changes twice a day until his wound is fully healed.  He will follow up with [place] in 3 weeks time for recheck.  He will call with any concerns or questions in the meantime.  Follow-Up Instructions: Return in about 3 weeks (around 07/06/2020).   Orders:  No orders of the defined types were placed in this encounter.  No orders of the defined types were placed in this encounter.   Imaging: No new imaging  PMFS History: Patient Active Problem List   Diagnosis Date Noted  . Infection of thumb 05/24/2020  . Foreign body of right thumb 04/21/2020  . Groin pain 12/22/2019  . Quadriceps tendinitis 06/07/2019  . Osteoarthritis, shoulder 06/07/2019  . Left foot pain 07/10/2017  . Hypogonadism in male 06/26/2009  . Hypothyroidism 10/08/2006   Past Medical History:  Diagnosis Date  . ALLERGIC REACTION 07/22/2009  . ALLERGIC RHINITIS 10/08/2006  . B12 DEFICIENCY 05/14/2010  . Carpal tunnel syndrome 12/31/2007  . Degeneration of cervical intervertebral disc 12/31/2007  . HYPERLIPIDEMIA 10/08/2006  . HYPOTHYROIDISM 10/08/2006  . Irritable bowel syndrome 03/03/2008  . LUMBAR RADICULOPATHY, RIGHT 12/31/2006  .  OSTEOARTHRITIS, LOWER LEG, LEFT 05/10/2009  . SINUSITIS - ACUTE-NOS 07/22/2009  . TESTICULAR HYPOFUNCTION 06/26/2009    Family History  Problem Relation Age of Onset  . Hypothyroidism Father   . Asthma Other   . Diabetes Other   . Allergy (severe) Other   . Hypothyroidism Mother   . Hypothyroidism Sister   . Hypothyroidism Brother     Past Surgical History:  Procedure Laterality Date  . BACK SURGERY    . I & D EXTREMITY Right 05/24/2020   Procedure: IRRIGATION AND DEBRIDEMENT RIGHT THUMB;  Surgeon: Tarry Kos, MD;  Location: [facility];  Service: Orthopedics;  Laterality: Right;  . KNEE SURGERY    . LUMBAR LAMINECTOMY  2008   with revision  . SPINAL FUSION  2011  . spinal fusion revision  2012   Social History   Occupational History  . Not on file  Tobacco Use  . Smoking status: Never Smoker  . Smokeless tobacco: Never Used  Substance and Sexual Activity  . Alcohol use: No  . Drug use: No  . Sexual activity: Yes

## 2020-06-16 DIAGNOSIS — J301 Allergic rhinitis due to pollen: Secondary | ICD-10-CM | POA: Diagnosis not present

## 2020-06-16 DIAGNOSIS — J3089 Other allergic rhinitis: Secondary | ICD-10-CM | POA: Diagnosis not present

## 2020-06-16 DIAGNOSIS — J3081 Allergic rhinitis due to animal (cat) (dog) hair and dander: Secondary | ICD-10-CM | POA: Diagnosis not present

## 2020-06-19 ENCOUNTER — Other Ambulatory Visit: Payer: Self-pay | Admitting: Endocrinology

## 2020-06-19 NOTE — Progress Notes (Signed)
Patient ID: Joseph Santana, male   DOB: 1955-10-10, 65 y.o.   MRN: [id number]            Reason for Appointment:  Follow-up of low testosterone      History of Present Illness:   HYPOGONADISM: Prior history: He was diagnosed to have a low testosterone level in 2012 or so when he was coming to his physician with complaints of increased fatigue.  Not clear what his baseline testosterone level was but he had a level of about 130 in 2013 around the time when he was having back surgery No record of detail evaluation with pituitary function available He thinks he was tried on AndroGel initially but because of skin irritation he was changed to testosterone injections.  He may have been prescribed Axiron also but does not remember this He thinks he had significantly better with taking testosterone injections, apparently was taking about 100 mg every 2 weeks He stopped taking the injections 6-8 months prior to his initial consultation  as they were not renewed and he had a change in his PCP  RECENT history: On his initial consultation he was complaining of fatigue  Baseline testosterone was 208 with LH of 2.1 and normal prolactin, free testosterone not done by the lab  He had been on a trial of clomiphene half tablet, initially 3 times a week since 01/11/16. With this he had more energy after starting treatment With this his testosterone level came back to normal until 2/18 when it was 494, subsequently had been progressively declining and was 273 done in 1/19 This was despite taking 50 mg 5 days a week; with this he was complaining of more fatigue and sluggishness and decreased motivation   Also complaining of decreased libido  RECENT history: Previously on Fortesta gel but this was changed because of redness, irritation and sunburn-like reaction on the skin Because of the skin irritation he was changed to the Axiron preparation, but this could not keep his levels consistent  He has been able  to start Jatenzo in 06/2019 after approval through his insurance  He was advised to take his morning dose with breakfast and evening dose with dinner and he is doing this  Initially started on 158 mg and now taking 198 mg since 02/2020 when his testosterone level had been lower He may have had some fatigue at that time also  Recently has not had any fatigue and feels fairly good, no change in libido Now his testosterone level has improved back up to over 500  His lab was drawn about 5 hours after his breakfast that day  His hemoglobin and also last PSA have been quite normal   He has had the following testosterone levels recently  Lab Results  Component Value Date   TESTOSTERONE 531.52 06/13/2020   TESTOSTERONE 487.59 03/21/2020   TESTOSTERONE 257.37 (L) 02/07/2020   TESTOSTERONE 554.70 07/28/2019    Lab Results  Component Value Date   LH 3.39 03/03/2017   LH 2.94 02/28/2016   LH 2.10 01/10/2016   Lab Results  Component Value Date   HGB 15.4 06/13/2020   Hypothyroidism was first diagnosed  about 30 years ago  At the time of diagnosis patient was having symptoms of  fatigue but he is not sure about all the other symptoms Most probably tested also because of strong family history of thyroid disease  This had been followed by PCP  He takes his thyroid supplement regularly in the morning  He  had been on levothyroxine 150 mcg daily for some time Even with trying to take this up to 30-minute before breakfast his TSH was still running high normal at 4.4 and now he is taking additional 12.5 mcg levothyroxine with his 150 dose  No complaints of fatigue now TSH back to normal at 3.1          Patient's weight history is as follows:  Wt Readings from Last 3 Encounters:  06/20/20 156 lb (70.8 kg)  05/24/20 153 lb 10.6 oz (69.7 kg)  05/08/20 150 lb (68 kg)    Thyroid function results have been as follows:  Lab Results  Component Value Date   TSH 3.11 06/13/2020   TSH  4.36 03/21/2020   TSH 4.11 02/07/2020   FREET4 1.12 06/13/2020   FREET4 0.91 02/07/2020   FREET4 1.04 06/29/2019      Past Medical History:  Diagnosis Date  . ALLERGIC REACTION 07/22/2009  . ALLERGIC RHINITIS 10/08/2006  . B12 DEFICIENCY 05/14/2010  . Carpal tunnel syndrome 12/31/2007  . Degeneration of cervical intervertebral disc 12/31/2007  . HYPERLIPIDEMIA 10/08/2006  . HYPOTHYROIDISM 10/08/2006  . Irritable bowel syndrome 03/03/2008  . LUMBAR RADICULOPATHY, RIGHT 12/31/2006  . OSTEOARTHRITIS, LOWER LEG, LEFT 05/10/2009  . SINUSITIS - ACUTE-NOS 07/22/2009  . TESTICULAR HYPOFUNCTION 06/26/2009    Past Surgical History:  Procedure Laterality Date  . BACK SURGERY    . I & D EXTREMITY Right 05/24/2020   Procedure: IRRIGATION AND DEBRIDEMENT RIGHT THUMB;  Surgeon: Tarry Kos, MD;  Location: [facility];  Service: Orthopedics;  Laterality: Right;  . KNEE SURGERY    . LUMBAR LAMINECTOMY  2008   with revision  . SPINAL FUSION  2011  . spinal fusion revision  2012    Family History  Problem Relation Age of Onset  . Hypothyroidism Father   . Asthma Other   . Diabetes Other   . Allergy (severe) Other   . Hypothyroidism Mother   . Hypothyroidism Sister   . Hypothyroidism Brother     Social History:  reports that he has never smoked. He has never used smokeless tobacco. He reports that he does not drink alcohol and does not use drugs.  Allergies:  Allergies  Allergen Reactions  . Iodine     IVP dye   . Levofloxacin     Allergies as of 06/20/2020      Reactions   Iodine    IVP dye   Levofloxacin       Medication List       Accurate as of June 20, 2020  4:02 PM. If you have any questions, ask your nurse or doctor.        EPINEPHrine 0.3 mg/0.3 mL Soaj injection Commonly known as: EPI-PEN EpiPen 2-Pak 0.3 mg/0.3 mL injection, auto-injector   HYDROcodone-acetaminophen 5-325 MG tablet Commonly known as: Norco Take 1 tablet by mouth every 6 (six) hours  as needed.   Jatenzo 198 MG Caps Generic drug: Testosterone Undecanoate TAKE 1 CAPSULE BY MOUTH AT BREAKFAST AND DINNER DAILY   levothyroxine 25 MCG tablet Commonly known as: Synthroid Take 1 tablet daily What changed: Another medication with the same name was changed. Make sure you understand how and when to take each.   levothyroxine 150 MCG tablet Commonly known as: SYNTHROID TAKE 1 TABLET BY MOUTH 30 MINUTES BEFORE BREAKFAST EVERY DAY What changed: See the new instructions.   montelukast 10 MG tablet Commonly known as: SINGULAIR Take 10 mg by mouth  at bedtime.   multivitamin capsule Take 1 capsule by mouth daily.   mupirocin ointment 2 % Commonly known as: BACTROBAN Apply 1 application topically 2 (two) times daily.   NASACORT ALLERGY 24HR NA Place into the nose.   nitroGLYCERIN 0.2 mg/hr patch Commonly known as: NITRODUR - Dosed in mg/24 hr Place 1/4 patch to the affected area daily.   Olopatadine HCl 0.6 % Soln       Review of Systems  He has had chronic insomnia        Examination:    BP 110/60   Pulse (!) 59   Ht 5\' 10"  (1.778 m)   Wt 156 lb (70.8 kg)   SpO2 99%   BMI 22.38 kg/m     Assessment:  HYPOGONADISM:   He has hypogonadotropic hypogonadism with baseline testosterone of 208 and has been symptomatic He is taking Jatenzo 198 mg twice daily With this now his energy level is quite good  Testosterone level is now consistently therapeutic around 500 He has been taking this supplement regularly with breakfast and dinner Labs were done about 5 hours after his morning dose  HYPOTHYROIDISM, TSH upper normal previously and now with 162.5 mcg his TSH is 3.1 #Fatigue   PLAN:  Continue both Jatenzo and levothyroxine as before Follow-up testosterone again to be done about 6 hours after morning dose Continue to monitor hematocrit  Follow-up in 6 months unless having any unusual fatigue   06/20/2020, 4:02 PM

## 2020-06-20 ENCOUNTER — Ambulatory Visit (INDEPENDENT_AMBULATORY_CARE_PROVIDER_SITE_OTHER): Payer: BC Managed Care – PPO | Admitting: Endocrinology

## 2020-06-20 ENCOUNTER — Other Ambulatory Visit: Payer: Self-pay

## 2020-06-20 ENCOUNTER — Encounter: Payer: Self-pay | Admitting: Endocrinology

## 2020-06-20 VITALS — BP 110/60 | HR 59 | Ht 70.0 in | Wt 156.0 lb

## 2020-06-20 DIAGNOSIS — E230 Hypopituitarism: Secondary | ICD-10-CM | POA: Diagnosis not present

## 2020-06-20 DIAGNOSIS — E039 Hypothyroidism, unspecified: Secondary | ICD-10-CM | POA: Diagnosis not present

## 2020-06-21 ENCOUNTER — Ambulatory Visit (INDEPENDENT_AMBULATORY_CARE_PROVIDER_SITE_OTHER): Payer: BC Managed Care – PPO | Admitting: Family Medicine

## 2020-06-21 VITALS — BP 100/64 | Ht 70.0 in | Wt 150.0 lb

## 2020-06-21 DIAGNOSIS — J3089 Other allergic rhinitis: Secondary | ICD-10-CM | POA: Diagnosis not present

## 2020-06-21 DIAGNOSIS — M84362G Stress fracture, left tibia, subsequent encounter for fracture with delayed healing: Secondary | ICD-10-CM | POA: Diagnosis not present

## 2020-06-21 DIAGNOSIS — J3081 Allergic rhinitis due to animal (cat) (dog) hair and dander: Secondary | ICD-10-CM | POA: Diagnosis not present

## 2020-06-21 DIAGNOSIS — J301 Allergic rhinitis due to pollen: Secondary | ICD-10-CM | POA: Diagnosis not present

## 2020-06-21 NOTE — Patient Instructions (Signed)
You have a delayed union of this stress fracture. No running for the next 4 weeks. I would avoid added weight when you do squats. Cycling, rowing, elliptical, walking all ok as long as no pain. Resist the temptation to run even if you're feeling great.

## 2020-06-22 ENCOUNTER — Encounter: Payer: Self-pay | Admitting: Family Medicine

## 2020-06-22 DIAGNOSIS — M25551 Pain in right hip: Secondary | ICD-10-CM | POA: Diagnosis not present

## 2020-06-22 DIAGNOSIS — M79662 Pain in left lower leg: Secondary | ICD-10-CM | POA: Diagnosis not present

## 2020-06-22 DIAGNOSIS — M25572 Pain in left ankle and joints of left foot: Secondary | ICD-10-CM | POA: Diagnosis not present

## 2020-06-22 NOTE — Progress Notes (Signed)
PCP: Corwin Levins, MD  Subjective:   HPI: Patient is a 65 y.o. male here for left shin pain.  12/20: Patient was initially seen on 10/18 due to left shin pain.  He was diagnosed with a stress fracture and started on stress fracture protocol.  He was reevaluated on 11/1 and reported he had been doing well but he did have to stop at the strengthening exercises because the pain was worsening.  He then messaged sports medicine on 11/10 regarding worsening pain since MRI was ordered.  MRI completed on 12/2 showed distal tibialis posterior tenosynovitis.  It also showed patchy indistinct marrow edema in the distal tibial shaft and metaphysis.  With increased edema compared to 11/19/2019.  Also may be a sprain in the deep tibiotalar portion of the deltoid ligament.  Subcutaneous edema along the medial anterior ankle was also seen.  Patient reports that he has continued to do none weightbearing exercises such as riding exercise bike and using the elliptical.  He does acknowledge that he ran 3 separate miles over the last few weeks.  2 of the mile runs he was wearing his brace and the third he was not.  Reports that the run where he did not wear the brace he noticed a significant amount of pain after the run as well as the that night.  Pain is in his tibia as well as in his ankle medially.  He takes ibuprofen intermittently as needed for the pain.  05/08/20: Patient reports pain has improved since last visit but still present. He had been cross training daily but has backed down to every other day. Able to bike 8-9 miles at a time without a problem. Stopped wearing aircast on 1/15. Stopped using elliptical. He tried running 9 minute mile pace but pain still persistent left medial shin.  3/9: Patient reports overall he is slightly improved compared to last visit. We discussed after his CT no running for 3 weeks then reevaluating. Starting around 2/14 he started running 12, 19, 14 miles overall in the  weeks following this date. Soreness initially that improves then worsens again after about 5 miles. No swelling. No new injuries.  Past Medical History:  Diagnosis Date  . ALLERGIC REACTION 07/22/2009  . ALLERGIC RHINITIS 10/08/2006  . B12 DEFICIENCY 05/14/2010  . Carpal tunnel syndrome 12/31/2007  . Degeneration of cervical intervertebral disc 12/31/2007  . HYPERLIPIDEMIA 10/08/2006  . HYPOTHYROIDISM 10/08/2006  . Irritable bowel syndrome 03/03/2008  . LUMBAR RADICULOPATHY, RIGHT 12/31/2006  . OSTEOARTHRITIS, LOWER LEG, LEFT 05/10/2009  . SINUSITIS - ACUTE-NOS 07/22/2009  . TESTICULAR HYPOFUNCTION 06/26/2009    Current Outpatient Medications on File Prior to Visit  Medication Sig Dispense Refill  . EPINEPHrine 0.3 mg/0.3 mL IJ SOAJ injection EpiPen 2-Pak 0.3 mg/0.3 mL injection, auto-injector    . HYDROcodone-acetaminophen (NORCO) 5-325 MG tablet Take 1 tablet by mouth every 6 (six) hours as needed. 20 tablet 0  . JATENZO 198 MG CAPS TAKE 1 CAPSULE BY MOUTH AT BREAKFAST AND DINNER DAILY 60 capsule 1  . levothyroxine (SYNTHROID) 150 MCG tablet TAKE 1 TABLET BY MOUTH 30 MINUTES BEFORE BREAKFAST EVERY DAY (Patient taking differently: 162.5 mcg.) 90 tablet 1  . levothyroxine (SYNTHROID) 25 MCG tablet Take 1 tablet daily 90 tablet 1  . montelukast (SINGULAIR) 10 MG tablet Take 10 mg by mouth at bedtime.    . Multiple Vitamin (MULTIVITAMIN) capsule Take 1 capsule by mouth daily.    . mupirocin ointment (BACTROBAN) 2 % Apply 1 application  topically 2 (two) times daily. 22 g 2  . nitroGLYCERIN (NITRODUR - DOSED IN MG/24 HR) 0.2 mg/hr patch Place 1/4 patch to the affected area daily. 30 patch 0  . Olopatadine HCl 0.6 % SOLN     . Triamcinolone Acetonide (NASACORT ALLERGY 24HR NA) Place into the nose.     No current facility-administered medications on file prior to visit.    Past Surgical History:  Procedure Laterality Date  . BACK SURGERY    . I & D EXTREMITY Right 05/24/2020   Procedure:  IRRIGATION AND DEBRIDEMENT RIGHT THUMB;  Surgeon: Tarry Kos, MD;  Location: [facility];  Service: Orthopedics;  Laterality: Right;  . KNEE SURGERY    . LUMBAR LAMINECTOMY  2008   with revision  . SPINAL FUSION  2011  . spinal fusion revision  2012    Allergies  Allergen Reactions  . Iodine     IVP dye   . Levofloxacin     Social History   Socioeconomic History  . Marital status: Married    Spouse name: Not on file  . Number of children: Not on file  . Years of education: Not on file  . Highest education level: Not on file  Occupational History  . Not on file  Tobacco Use  . Smoking status: Never Smoker  . Smokeless tobacco: Never Used  Substance and Sexual Activity  . Alcohol use: No  . Drug use: No  . Sexual activity: Yes  Other Topics Concern  . Not on file  Social History Narrative  . Not on file   Social Determinants of Health   Financial Resource Strain: Not on file  Food Insecurity: Not on file  Transportation Needs: Not on file  Physical Activity: Not on file  Stress: Not on file  Social Connections: Not on file  Intimate Partner Violence: Not on file    Family History  Problem Relation Age of Onset  . Hypothyroidism Father   . Asthma Other   . Diabetes Other   . Allergy (severe) Other   . Hypothyroidism Mother   . Hypothyroidism Sister   . Hypothyroidism Brother     BP 100/64   Ht 5\' 10"  (1.778 m)   Wt 150 lb (68 kg)   BMI 21.52 kg/m   [facility] 12/22/2019 01/31/2020 02/14/2020 03/15/2020  Frequency of aerobic exercise (# of days/week) 7 7 6 6   Average time in minutes 90 90 75 75  Frequency of strengthening activities (# of days/week) 3 1 0 0    No flowsheet data found.  Review of Systems: See HPI above.     Objective:  Physical Exam:  Gen: NAD, comfortable in exam room  Left ankle/lower leg: No gross deformity, swelling, bruising. FROM ankle without pain.  Normal strength. Mild TTP  medial tibia between distal and middle 1/3rd. Negative syndesmotic compression, thompsons.  Limited MSK u/s left tibia: callus again evident with mild neovascularity in area of stress fracture.  Assessment & Plan:  1. Left distal tibia stress fracture - delayed union.  CT did show bridging callus with fracture line still evident though.  Had wanted him to come back after 3 weeks to reevaluate, depending on how he was doing consider walk:jog in aircast.  He has been running 12-19 miles a week past 3 weeks.  We had a discussion about delayed healing of this stress fracture though it is healing.  Believe he is pushing himself too much to  get back out and run as he feels a little better and not fully allowing this to heal.  We discussed this could lead to nonunion, possible IM rod.  No running at all next 4 weeks.  Cycling, rowing, elliptical, walking all ok though if no pain with these.

## 2020-06-26 LAB — FUNGAL ORGANISM REFLEX

## 2020-06-26 LAB — FUNGUS CULTURE RESULT

## 2020-06-26 LAB — FUNGUS CULTURE WITH STAIN

## 2020-06-28 DIAGNOSIS — J3081 Allergic rhinitis due to animal (cat) (dog) hair and dander: Secondary | ICD-10-CM | POA: Diagnosis not present

## 2020-06-28 DIAGNOSIS — J301 Allergic rhinitis due to pollen: Secondary | ICD-10-CM | POA: Diagnosis not present

## 2020-06-28 DIAGNOSIS — J3089 Other allergic rhinitis: Secondary | ICD-10-CM | POA: Diagnosis not present

## 2020-07-04 ENCOUNTER — Ambulatory Visit (INDEPENDENT_AMBULATORY_CARE_PROVIDER_SITE_OTHER): Payer: BC Managed Care – PPO | Admitting: Orthopaedic Surgery

## 2020-07-04 DIAGNOSIS — L089 Local infection of the skin and subcutaneous tissue, unspecified: Secondary | ICD-10-CM

## 2020-07-04 NOTE — Progress Notes (Signed)
   Post-Op Visit Note   Patient: Joseph Santana           Date of Birth: [birth year]           MRN: [id number] Visit Date: 07/04/2020 PCP: Corwin Levins, MD   Assessment & Plan:  Chief Complaint:  Chief Complaint  Patient presents with  . Right Hand - Follow-up    Thumb wound   Visit Diagnoses:  1. Infection of thumb     Plan:   Joseph Santana returns today for wound check of the right thumb.  He has no complaints.  The surgical site has completely healed.  There is no tenderness to palpation.  He has excellent movement and arc of motion of the thumb.  At this point we will release him.  Follow-Up Instructions: Return if symptoms worsen or fail to improve.   Orders:  No orders of the defined types were placed in this encounter.  No orders of the defined types were placed in this encounter.   Imaging: No results found.  PMFS History: Patient Active Problem List   Diagnosis Date Noted  . Infection of thumb 05/24/2020  . Foreign body of right thumb 04/21/2020  . Groin pain 12/22/2019  . Quadriceps tendinitis 06/07/2019  . Osteoarthritis, shoulder 06/07/2019  . Left foot pain 07/10/2017  . Hypogonadism in male 06/26/2009  . Hypothyroidism 10/08/2006   Past Medical History:  Diagnosis Date  . ALLERGIC REACTION 07/22/2009  . ALLERGIC RHINITIS 10/08/2006  . B12 DEFICIENCY 05/14/2010  . Carpal tunnel syndrome 12/31/2007  . Degeneration of cervical intervertebral disc 12/31/2007  . HYPERLIPIDEMIA 10/08/2006  . HYPOTHYROIDISM 10/08/2006  . Irritable bowel syndrome 03/03/2008  . LUMBAR RADICULOPATHY, RIGHT 12/31/2006  . OSTEOARTHRITIS, LOWER LEG, LEFT 05/10/2009  . SINUSITIS - ACUTE-NOS 07/22/2009  . TESTICULAR HYPOFUNCTION 06/26/2009    Family History  Problem Relation Age of Onset  . Hypothyroidism Father   . Asthma Other   . Diabetes Other   . Allergy (severe) Other   . Hypothyroidism Mother   . Hypothyroidism Sister   . Hypothyroidism Brother     Past Surgical History:   Procedure Laterality Date  . BACK SURGERY    . I & D EXTREMITY Right 05/24/2020   Procedure: IRRIGATION AND DEBRIDEMENT RIGHT THUMB;  Surgeon: Tarry Kos, MD;  Location: [facility];  Service: Orthopedics;  Laterality: Right;  . KNEE SURGERY    . LUMBAR LAMINECTOMY  2008   with revision  . SPINAL FUSION  2011  . spinal fusion revision  2012   Social History   Occupational History  . Not on file  Tobacco Use  . Smoking status: Never Smoker  . Smokeless tobacco: Never Used  Substance and Sexual Activity  . Alcohol use: No  . Drug use: No  . Sexual activity: Yes

## 2020-07-06 ENCOUNTER — Ambulatory Visit: Payer: BC Managed Care – PPO | Admitting: Orthopaedic Surgery

## 2020-07-07 DIAGNOSIS — J3081 Allergic rhinitis due to animal (cat) (dog) hair and dander: Secondary | ICD-10-CM | POA: Diagnosis not present

## 2020-07-07 DIAGNOSIS — J301 Allergic rhinitis due to pollen: Secondary | ICD-10-CM | POA: Diagnosis not present

## 2020-07-07 DIAGNOSIS — J3089 Other allergic rhinitis: Secondary | ICD-10-CM | POA: Diagnosis not present

## 2020-07-07 LAB — ACID FAST CULTURE WITH REFLEXED SENSITIVITIES (MYCOBACTERIA): Acid Fast Culture: NEGATIVE

## 2020-07-09 DIAGNOSIS — M84362G Stress fracture, left tibia, subsequent encounter for fracture with delayed healing: Secondary | ICD-10-CM

## 2020-07-13 DIAGNOSIS — J3089 Other allergic rhinitis: Secondary | ICD-10-CM | POA: Diagnosis not present

## 2020-07-13 DIAGNOSIS — J301 Allergic rhinitis due to pollen: Secondary | ICD-10-CM | POA: Diagnosis not present

## 2020-07-13 DIAGNOSIS — J3081 Allergic rhinitis due to animal (cat) (dog) hair and dander: Secondary | ICD-10-CM | POA: Diagnosis not present

## 2020-07-17 ENCOUNTER — Other Ambulatory Visit: Payer: Self-pay | Admitting: *Deleted

## 2020-07-17 ENCOUNTER — Encounter: Payer: Self-pay | Admitting: *Deleted

## 2020-07-17 DIAGNOSIS — M84362G Stress fracture, left tibia, subsequent encounter for fracture with delayed healing: Secondary | ICD-10-CM

## 2020-07-17 NOTE — Progress Notes (Signed)
Authorization number for the MRI left tib/fib is [id number]

## 2020-07-20 DIAGNOSIS — J3089 Other allergic rhinitis: Secondary | ICD-10-CM | POA: Diagnosis not present

## 2020-07-20 DIAGNOSIS — J3081 Allergic rhinitis due to animal (cat) (dog) hair and dander: Secondary | ICD-10-CM | POA: Diagnosis not present

## 2020-07-20 DIAGNOSIS — J301 Allergic rhinitis due to pollen: Secondary | ICD-10-CM | POA: Diagnosis not present

## 2020-07-22 ENCOUNTER — Telehealth: Payer: BC Managed Care – PPO | Admitting: Nurse Practitioner

## 2020-07-22 DIAGNOSIS — J019 Acute sinusitis, unspecified: Secondary | ICD-10-CM

## 2020-07-22 MED ORDER — CEFDINIR 300 MG PO CAPS: 300.0000 mg | ORAL_CAPSULE | Freq: Two times a day (BID) | ORAL | 0 refills | Status: DC

## 2020-07-22 NOTE — Progress Notes (Signed)
We are sorry that you are not feeling well.  Here is how we plan to help!  Based on what you have shared with me it looks like you have sinusitis.  Sinusitis is inflammation and infection in the sinus cavities of the head.  Based on your presentation I believe you most likely have Acute Bacterial Sinusitis.  This is an infection caused by bacteria and is treated with antibiotics. I have prescribed cefdinir 1 po bid for 7 days. You may use an oral decongestant such as Mucinex D or if you have glaucoma or high blood pressure use plain Mucinex. Saline nasal spray help and can safely be used as often as needed for congestion.  If you develop worsening sinus pain, fever or notice severe headache and vision changes, or if symptoms are not better after completion of antibiotic, please schedule an appointment with a health care provider.    Sinus infections are not as easily transmitted as other respiratory infection, however we still recommend that you avoid close contact with loved ones, especially the very young and elderly.  Remember to wash your hands thoroughly throughout the day as this is the number one way to prevent the spread of infection!  Home Care:  Only take medications as instructed by your medical team.  Complete the entire course of an antibiotic.  Do not take these medications with alcohol.  A steam or ultrasonic humidifier can help congestion.  You can place a towel over your head and breathe in the steam from hot water coming from a faucet.  Avoid close contacts especially the very young and the elderly.  Cover your mouth when you cough or sneeze.  Always remember to wash your hands.  Get Help Right Away If:  You develop worsening fever or sinus pain.  You develop a severe head ache or visual changes.  Your symptoms persist after you have completed your treatment plan.  Make sure you  Understand these instructions.  Will watch your condition.  Will get help right away  if you are not doing well or get worse.  Your e-visit answers were reviewed by a board certified advanced clinical practitioner to complete your personal care plan.  Depending on the condition, your plan could have included both over the counter or prescription medications.  If there is a problem please reply  once you have received a response from your provider.  Your safety is important to [place].  If you have drug allergies check your prescription carefully.    You can use MyChart to ask questions about today's visit, request a non-urgent call back, or ask for a work or school excuse for 24 hours related to this e-Visit. If it has been greater than 24 hours you will need to follow up with your provider, or enter a new e-Visit to address those concerns.  You will get an e-mail in the next two days asking about your experience.  I hope that your e-visit has been valuable and will speed your recovery. Thank you for using e-visits.  5-10 minutes spent reviewing and documenting in chart.

## 2020-07-27 DIAGNOSIS — J3089 Other allergic rhinitis: Secondary | ICD-10-CM | POA: Diagnosis not present

## 2020-07-27 DIAGNOSIS — J3081 Allergic rhinitis due to animal (cat) (dog) hair and dander: Secondary | ICD-10-CM | POA: Diagnosis not present

## 2020-07-27 DIAGNOSIS — J301 Allergic rhinitis due to pollen: Secondary | ICD-10-CM | POA: Diagnosis not present

## 2020-07-29 ENCOUNTER — Ambulatory Visit
Admission: RE | Admit: 2020-07-29 | Discharge: 2020-07-29 | Disposition: A | Discharge status: Home / Self Care | Payer: BC Managed Care – PPO | Source: Ambulatory Visit | Attending: Family Medicine | Admitting: Family Medicine

## 2020-07-29 ENCOUNTER — Other Ambulatory Visit: Payer: Self-pay

## 2020-07-29 DIAGNOSIS — M84362G Stress fracture, left tibia, subsequent encounter for fracture with delayed healing: Secondary | ICD-10-CM

## 2020-07-29 DIAGNOSIS — M84362D Stress fracture, left tibia, subsequent encounter for fracture with routine healing: Secondary | ICD-10-CM | POA: Diagnosis not present

## 2020-08-03 DIAGNOSIS — J3089 Other allergic rhinitis: Secondary | ICD-10-CM | POA: Diagnosis not present

## 2020-08-03 DIAGNOSIS — J301 Allergic rhinitis due to pollen: Secondary | ICD-10-CM | POA: Diagnosis not present

## 2020-08-03 DIAGNOSIS — J3081 Allergic rhinitis due to animal (cat) (dog) hair and dander: Secondary | ICD-10-CM | POA: Diagnosis not present

## 2020-08-10 DIAGNOSIS — J3081 Allergic rhinitis due to animal (cat) (dog) hair and dander: Secondary | ICD-10-CM | POA: Diagnosis not present

## 2020-08-10 DIAGNOSIS — J3089 Other allergic rhinitis: Secondary | ICD-10-CM | POA: Diagnosis not present

## 2020-08-10 DIAGNOSIS — J301 Allergic rhinitis due to pollen: Secondary | ICD-10-CM | POA: Diagnosis not present

## 2020-08-17 DIAGNOSIS — J301 Allergic rhinitis due to pollen: Secondary | ICD-10-CM | POA: Diagnosis not present

## 2020-08-17 DIAGNOSIS — J3081 Allergic rhinitis due to animal (cat) (dog) hair and dander: Secondary | ICD-10-CM | POA: Diagnosis not present

## 2020-08-17 DIAGNOSIS — J3089 Other allergic rhinitis: Secondary | ICD-10-CM | POA: Diagnosis not present

## 2020-08-18 ENCOUNTER — Other Ambulatory Visit: Payer: Self-pay | Admitting: Endocrinology

## 2020-08-31 DIAGNOSIS — J301 Allergic rhinitis due to pollen: Secondary | ICD-10-CM | POA: Diagnosis not present

## 2020-08-31 DIAGNOSIS — J3081 Allergic rhinitis due to animal (cat) (dog) hair and dander: Secondary | ICD-10-CM | POA: Diagnosis not present

## 2020-08-31 DIAGNOSIS — J3089 Other allergic rhinitis: Secondary | ICD-10-CM | POA: Diagnosis not present

## 2020-08-31 NOTE — Progress Notes (Signed)
Referral to Dr. Everardo Pacific for left tibial stress fracture [facility] [address] [place], [place] [phone]  Appt: 09/01/20 @ 9:30 am

## 2020-09-01 DIAGNOSIS — M84362A Stress fracture, left tibia, initial encounter for fracture: Secondary | ICD-10-CM | POA: Diagnosis not present

## 2020-09-05 DIAGNOSIS — J301 Allergic rhinitis due to pollen: Secondary | ICD-10-CM | POA: Diagnosis not present

## 2020-09-06 DIAGNOSIS — J3089 Other allergic rhinitis: Secondary | ICD-10-CM | POA: Diagnosis not present

## 2020-09-07 DIAGNOSIS — J301 Allergic rhinitis due to pollen: Secondary | ICD-10-CM | POA: Diagnosis not present

## 2020-09-07 DIAGNOSIS — J3089 Other allergic rhinitis: Secondary | ICD-10-CM | POA: Diagnosis not present

## 2020-09-07 DIAGNOSIS — J3081 Allergic rhinitis due to animal (cat) (dog) hair and dander: Secondary | ICD-10-CM | POA: Diagnosis not present

## 2020-09-14 DIAGNOSIS — J301 Allergic rhinitis due to pollen: Secondary | ICD-10-CM | POA: Diagnosis not present

## 2020-09-14 DIAGNOSIS — J3089 Other allergic rhinitis: Secondary | ICD-10-CM | POA: Diagnosis not present

## 2020-09-14 DIAGNOSIS — J3081 Allergic rhinitis due to animal (cat) (dog) hair and dander: Secondary | ICD-10-CM | POA: Diagnosis not present

## 2020-09-19 DIAGNOSIS — J3081 Allergic rhinitis due to animal (cat) (dog) hair and dander: Secondary | ICD-10-CM | POA: Diagnosis not present

## 2020-09-22 DIAGNOSIS — J301 Allergic rhinitis due to pollen: Secondary | ICD-10-CM | POA: Diagnosis not present

## 2020-09-22 DIAGNOSIS — J3081 Allergic rhinitis due to animal (cat) (dog) hair and dander: Secondary | ICD-10-CM | POA: Diagnosis not present

## 2020-09-22 DIAGNOSIS — J3089 Other allergic rhinitis: Secondary | ICD-10-CM | POA: Diagnosis not present

## 2020-09-28 DIAGNOSIS — J3089 Other allergic rhinitis: Secondary | ICD-10-CM | POA: Diagnosis not present

## 2020-09-28 DIAGNOSIS — J301 Allergic rhinitis due to pollen: Secondary | ICD-10-CM | POA: Diagnosis not present

## 2020-09-28 DIAGNOSIS — J3081 Allergic rhinitis due to animal (cat) (dog) hair and dander: Secondary | ICD-10-CM | POA: Diagnosis not present

## 2020-10-04 DIAGNOSIS — H52203 Unspecified astigmatism, bilateral: Secondary | ICD-10-CM | POA: Diagnosis not present

## 2020-10-04 DIAGNOSIS — H2513 Age-related nuclear cataract, bilateral: Secondary | ICD-10-CM | POA: Diagnosis not present

## 2020-10-04 DIAGNOSIS — H524 Presbyopia: Secondary | ICD-10-CM | POA: Diagnosis not present

## 2020-10-04 DIAGNOSIS — H5213 Myopia, bilateral: Secondary | ICD-10-CM | POA: Diagnosis not present

## 2020-10-05 DIAGNOSIS — J3081 Allergic rhinitis due to animal (cat) (dog) hair and dander: Secondary | ICD-10-CM | POA: Diagnosis not present

## 2020-10-05 DIAGNOSIS — J3089 Other allergic rhinitis: Secondary | ICD-10-CM | POA: Diagnosis not present

## 2020-10-05 DIAGNOSIS — J301 Allergic rhinitis due to pollen: Secondary | ICD-10-CM | POA: Diagnosis not present

## 2020-10-13 DIAGNOSIS — J3089 Other allergic rhinitis: Secondary | ICD-10-CM | POA: Diagnosis not present

## 2020-10-13 DIAGNOSIS — J301 Allergic rhinitis due to pollen: Secondary | ICD-10-CM | POA: Diagnosis not present

## 2020-10-13 DIAGNOSIS — J3081 Allergic rhinitis due to animal (cat) (dog) hair and dander: Secondary | ICD-10-CM | POA: Diagnosis not present

## 2020-10-17 ENCOUNTER — Other Ambulatory Visit: Payer: Self-pay | Admitting: Endocrinology

## 2020-10-19 DIAGNOSIS — J3081 Allergic rhinitis due to animal (cat) (dog) hair and dander: Secondary | ICD-10-CM | POA: Diagnosis not present

## 2020-10-19 DIAGNOSIS — J301 Allergic rhinitis due to pollen: Secondary | ICD-10-CM | POA: Diagnosis not present

## 2020-10-19 DIAGNOSIS — J3089 Other allergic rhinitis: Secondary | ICD-10-CM | POA: Diagnosis not present

## 2020-10-21 ENCOUNTER — Telehealth: Payer: BC Managed Care – PPO | Admitting: Nurse Practitioner

## 2020-10-21 DIAGNOSIS — J019 Acute sinusitis, unspecified: Secondary | ICD-10-CM

## 2020-10-21 MED ORDER — CEFDINIR 300 MG PO CAPS: 300.0000 mg | ORAL_CAPSULE | Freq: Two times a day (BID) | ORAL | 0 refills | Status: DC

## 2020-10-21 NOTE — Progress Notes (Signed)

## 2020-10-21 NOTE — Addendum Note (Signed)
Addended by: Bennie Pierini on: 10/21/2020 02:43 PM   Modules accepted: Orders

## 2020-11-07 DIAGNOSIS — J3081 Allergic rhinitis due to animal (cat) (dog) hair and dander: Secondary | ICD-10-CM | POA: Diagnosis not present

## 2020-11-07 DIAGNOSIS — J3089 Other allergic rhinitis: Secondary | ICD-10-CM | POA: Diagnosis not present

## 2020-11-07 DIAGNOSIS — J301 Allergic rhinitis due to pollen: Secondary | ICD-10-CM | POA: Diagnosis not present

## 2020-11-10 ENCOUNTER — Encounter: Payer: Self-pay | Admitting: Internal Medicine

## 2020-11-10 ENCOUNTER — Other Ambulatory Visit: Payer: Self-pay

## 2020-11-10 ENCOUNTER — Ambulatory Visit (INDEPENDENT_AMBULATORY_CARE_PROVIDER_SITE_OTHER): Payer: BC Managed Care – PPO | Admitting: Internal Medicine

## 2020-11-10 VITALS — BP 116/68 | HR 53 | Temp 97.7°F | Ht 70.0 in | Wt 153.0 lb

## 2020-11-10 DIAGNOSIS — E7800 Pure hypercholesterolemia, unspecified: Secondary | ICD-10-CM | POA: Diagnosis not present

## 2020-11-10 DIAGNOSIS — Z23 Encounter for immunization: Secondary | ICD-10-CM

## 2020-11-10 DIAGNOSIS — E039 Hypothyroidism, unspecified: Secondary | ICD-10-CM

## 2020-11-10 DIAGNOSIS — E538 Deficiency of other specified B group vitamins: Secondary | ICD-10-CM | POA: Diagnosis not present

## 2020-11-10 DIAGNOSIS — Z0000 Encounter for general adult medical examination without abnormal findings: Secondary | ICD-10-CM | POA: Insufficient documentation

## 2020-11-10 DIAGNOSIS — Z125 Encounter for screening for malignant neoplasm of prostate: Secondary | ICD-10-CM | POA: Diagnosis not present

## 2020-11-10 DIAGNOSIS — Z0001 Encounter for general adult medical examination with abnormal findings: Secondary | ICD-10-CM | POA: Diagnosis not present

## 2020-11-10 DIAGNOSIS — E559 Vitamin D deficiency, unspecified: Secondary | ICD-10-CM

## 2020-11-10 LAB — CBC WITH DIFFERENTIAL/PLATELET
Basophils Absolute: 0.1 10*3/uL (ref 0.0–0.1)
Basophils Relative: 1.3 % (ref 0.0–3.0)
Eosinophils Absolute: 0.1 10*3/uL (ref 0.0–0.7)
Eosinophils Relative: 2.5 % (ref 0.0–5.0)
HCT: 46.2 % (ref 39.0–52.0)
Hemoglobin: 15.7 g/dL (ref 13.0–17.0)
Lymphocytes Relative: 26.2 % (ref 12.0–46.0)
Lymphs Abs: 1.1 10*3/uL (ref 0.7–4.0)
MCHC: 34 g/dL (ref 30.0–36.0)
MCV: 90.7 fl (ref 78.0–100.0)
Monocytes Absolute: 0.5 10*3/uL (ref 0.1–1.0)
Monocytes Relative: 10.8 % (ref 3.0–12.0)
Neutro Abs: 2.6 10*3/uL (ref 1.4–7.7)
Neutrophils Relative %: 59.2 % (ref 43.0–77.0)
Platelets: 225 10*3/uL (ref 150.0–400.0)
RBC: 5.09 Mil/uL (ref 4.22–5.81)
RDW: 12.9 % (ref 11.5–15.5)
WBC: 4.3 10*3/uL (ref 4.0–10.5)

## 2020-11-10 LAB — URINALYSIS, ROUTINE W REFLEX MICROSCOPIC
Bilirubin Urine: NEGATIVE
Hgb urine dipstick: NEGATIVE
Ketones, ur: NEGATIVE
Leukocytes,Ua: NEGATIVE
Nitrite: NEGATIVE
RBC / HPF: NONE SEEN (ref 0–?)
Specific Gravity, Urine: 1.015 (ref 1.000–1.030)
Total Protein, Urine: NEGATIVE
Urine Glucose: NEGATIVE
Urobilinogen, UA: 0.2 (ref 0.0–1.0)
WBC, UA: NONE SEEN (ref 0–?)
pH: 7.5 (ref 5.0–8.0)

## 2020-11-10 LAB — BASIC METABOLIC PANEL
BUN: 17 mg/dL (ref 6–23)
CO2: 33 mEq/L — ABNORMAL HIGH (ref 19–32)
Calcium: 9.6 mg/dL (ref 8.4–10.5)
Chloride: 101 mEq/L (ref 96–112)
Creatinine, Ser: 1.06 mg/dL (ref 0.40–1.50)
GFR: 75.04 mL/min (ref >60.00)
Glucose, Bld: 87 mg/dL (ref 70–99)
Potassium: 4.4 mEq/L (ref 3.5–5.1)
Sodium: 140 mEq/L (ref 135–145)

## 2020-11-10 LAB — HEPATIC FUNCTION PANEL
ALT: 21 U/L (ref 0–53)
AST: 25 U/L (ref 0–37)
Albumin: 4.4 g/dL (ref 3.5–5.2)
Alkaline Phosphatase: 45 U/L (ref 39–117)
Bilirubin, Direct: 0.2 mg/dL (ref 0.0–0.3)
Total Bilirubin: 0.9 mg/dL (ref 0.2–1.2)
Total Protein: 6.8 g/dL (ref 6.0–8.3)

## 2020-11-10 LAB — LIPID PANEL
Cholesterol: 164 mg/dL (ref 0–200)
HDL: 57.7 mg/dL (ref >39.00)
LDL Cholesterol: 97 mg/dL (ref 0–99)
NonHDL: 106.77
Total CHOL/HDL Ratio: 3
Triglycerides: 49 mg/dL (ref 0.0–149.0)
VLDL: 9.8 mg/dL (ref 0.0–40.0)

## 2020-11-10 LAB — VITAMIN D 25 HYDROXY (VIT D DEFICIENCY, FRACTURES): VITD: 98.62 ng/mL (ref 30.00–100.00)

## 2020-11-10 LAB — PSA: PSA: 1.48 ng/mL (ref 0.10–4.00)

## 2020-11-10 LAB — TSH: TSH: 0.58 u[IU]/mL (ref 0.35–5.50)

## 2020-11-10 LAB — VITAMIN B12: Vitamin B-12: >1550 pg/mL — ABNORMAL HIGH (ref 211–911)

## 2020-11-10 NOTE — Patient Instructions (Addendum)
You had the Shingles shot #1 today  Please return in 2 -6 mo for shingles shot #2 with a Nurse Visit  We have discussed the Cardiac CT Score test to measure the calcification level (if any) in your heart arteries.  This test has been ordered in our Computer System, so please call [facility] directly, as they prefer this, at [phone] to be scheduled.  Please continue all other medications as before, and refills have been done if requested.  Please have the pharmacy call with any other refills you may need.  Please continue your efforts at being more active, low cholesterol diet, and weight control.  You are otherwise up to date with prevention measures today.  Please keep your appointments with your specialists as you may have planned  Please make an Appointment to return for your 1 year visit, or sooner if needed

## 2020-11-11 ENCOUNTER — Encounter: Payer: Self-pay | Admitting: Internal Medicine

## 2020-11-11 NOTE — Assessment & Plan Note (Signed)
Lab Results  Component Value Date   TSH 0.58 11/10/2020   Stable, pt to continue levothyroxine

## 2020-11-11 NOTE — Progress Notes (Signed)
Chief Complaint:: wellness exam       HPI:  Joseph Santana is a 65 y.o. male here for wellness exam; due for shingrix #1 today, o/w up to date with preventive referrals and immunizations. No new complaints.  Pt denies chest pain, increased sob or doe, wheezing, orthopnea, PND, increased LE swelling, palpitations, dizziness or syncope.   Pt denies polydipsia, polyuria, or new focal neuro s/s.  Pt denies fever, wt loss, night sweats, loss of appetite, or other constitutional symptoms  .     Wt Readings from Last 3 Encounters:  11/10/20 153 lb (69.4 kg)  06/21/20 150 lb (68 kg)  06/20/20 156 lb (70.8 kg)   BP Readings from Last 3 Encounters:  11/10/20 116/68  06/21/20 100/64  06/20/20 110/60   Immunization History  Administered Date(s) Administered   Influenza Split 02/15/2011, 02/12/2012   Influenza Whole 12/31/2007, 01/13/2009, 02/13/2010, 02/04/2014   Influenza,inj,Quad PF,6+ Mos 01/22/2013, 12/28/2015, 01/14/2017, 01/14/2018, 01/07/2019   PFIZER(Purple Top)SARS-COV-2 Vaccination 06/28/2019, 07/19/2019, 03/05/2020   Tdap 12/28/2015   Zoster Recombinat (Shingrix) 11/10/2020  There are no preventive care reminders to display for this patient.    Past Medical History:  Diagnosis Date   ALLERGIC REACTION 07/22/2009   ALLERGIC RHINITIS 10/08/2006   B12 DEFICIENCY 05/14/2010   Carpal tunnel syndrome 12/31/2007   Degeneration of cervical intervertebral disc 12/31/2007   HYPERLIPIDEMIA 10/08/2006   Hypogonadism in male 06/26/2009   Qualifier: Diagnosis of  By: Lovell Sheehan MD, Jobin Montelongo E    HYPOTHYROIDISM 10/08/2006   Irritable bowel syndrome 03/03/2008   LUMBAR RADICULOPATHY, RIGHT 12/31/2006   OSTEOARTHRITIS, LOWER LEG, LEFT 05/10/2009   SINUSITIS - ACUTE-NOS 07/22/2009   TESTICULAR HYPOFUNCTION 06/26/2009   Past Surgical History:  Procedure Laterality Date   BACK SURGERY     I & D EXTREMITY Right 05/24/2020   Procedure: IRRIGATION AND DEBRIDEMENT RIGHT THUMB;  Surgeon: Tarry Kos, MD;   Location: [facility];  Service: Orthopedics;  Laterality: Right;   KNEE SURGERY     LUMBAR LAMINECTOMY  2008   with revision   SPINAL FUSION  2011   spinal fusion revision  2012    reports that he has never smoked. He has never used smokeless tobacco. He reports that he does not drink alcohol and does not use drugs. family history includes Allergy (severe) in an other family member; Asthma in an other family member; Diabetes in an other family member; Hypothyroidism in his brother, father, mother, and sister. Allergies  Allergen Reactions   Iodine     IVP dye    Levofloxacin    Current Outpatient Medications on File Prior to Visit  Medication Sig Dispense Refill   EPINEPHrine 0.3 mg/0.3 mL IJ SOAJ injection EpiPen 2-Pak 0.3 mg/0.3 mL injection, auto-injector     JATENZO 198 MG CAPS TAKE 1 CAPSULE BY MOUTH AT BREAKFAST AND DINNER DAILY 60 capsule 4   levothyroxine (SYNTHROID) 150 MCG tablet TAKE 1 TABLET BY MOUTH 30 MINUTES BEFORE BREAKFAST EVERY DAY. 90 tablet 1   levothyroxine (SYNTHROID) 25 MCG tablet TAKE 1 TABLET BY MOUTH EVERY DAY 90 tablet 1   montelukast (SINGULAIR) 10 MG tablet Take 10 mg by mouth at bedtime.     Multiple Vitamin (MULTIVITAMIN) capsule Take 1 capsule by mouth daily.     Triamcinolone Acetonide (NASACORT ALLERGY 24HR NA) Place into the nose.     nitroGLYCERIN (NITRODUR - DOSED IN MG/24 HR) 0.2 mg/hr patch Place 1/4 patch to  the affected area daily. (Patient not taking: Reported on 11/10/2020) 30 patch 0   No current facility-administered medications on file prior to visit.        ROS:  All others reviewed and negative.  Objective        PE:  BP 116/68   Pulse (!) 53   Temp 97.7 F (36.5 C) (Oral)   Ht 5\' 10"  (1.778 m)   Wt 153 lb (69.4 kg)   SpO2 99%   BMI 21.95 kg/m                 Constitutional: Pt appears in NAD               HENT: Head: NCAT.                Right Ear: External ear normal.                 Left Ear: External ear  normal.                Eyes: . Pupils are equal, round, and reactive to light. Conjunctivae and EOM are normal               Nose: without d/c or deformity               Neck: Neck supple. Gross normal ROM               Cardiovascular: Normal rate and regular rhythm.                 Pulmonary/Chest: Effort normal and breath sounds without rales or wheezing.                Abd:  Soft, NT, ND, + BS, no organomegaly               Neurological: Pt is alert. At baseline orientation, motor grossly intact               Skin: Skin is warm. No rashes, no other new lesions, LE edema - none               Psychiatric: Pt behavior is normal without agitation   Micro: none  Cardiac tracings I have personally interpreted today:  none  Pertinent Radiological findings (summarize): none   Lab Results  Component Value Date   WBC 4.3 11/10/2020   HGB 15.7 11/10/2020   HCT 46.2 11/10/2020   PLT 225.0 11/10/2020   GLUCOSE 87 11/10/2020   CHOL 164 11/10/2020   TRIG 49.0 11/10/2020   HDL 57.70 11/10/2020   LDLDIRECT 146.2 11/19/2012   LDLCALC 97 11/10/2020   ALT 21 11/10/2020   AST 25 11/10/2020   NA 140 11/10/2020   K 4.4 11/10/2020   CL 101 11/10/2020   CREATININE 1.06 11/10/2020   BUN 17 11/10/2020   CO2 33 (H) 11/10/2020   TSH 0.58 11/10/2020   PSA 1.48 11/10/2020   Assessment/Plan:  Joseph Santana is a 65 y.o. White or Caucasian [1] male with  has a past medical history of ALLERGIC REACTION (07/22/2009), ALLERGIC RHINITIS (10/08/2006), B12 DEFICIENCY (05/14/2010), Carpal tunnel syndrome (12/31/2007), Degeneration of cervical intervertebral disc (12/31/2007), HYPERLIPIDEMIA (10/08/2006), Hypogonadism in male (06/26/2009), HYPOTHYROIDISM (10/08/2006), Irritable bowel syndrome (03/03/2008), LUMBAR RADICULOPATHY, RIGHT (12/31/2006), OSTEOARTHRITIS, LOWER LEG, LEFT (05/10/2009), SINUSITIS - ACUTE-NOS (07/22/2009), and TESTICULAR HYPOFUNCTION (06/26/2009).  Preventative health care Age and sex appropriate  education and counseling updated with regular exercise and diet Referrals for  preventative services - none needed; also for self pay cardiac CT score Immunizations addressed - for shingrix #1 today Smoking counseling  - none needed Evidence for depression or other mood disorder - none significant Most recent labs reviewed. I have personally reviewed and have noted: 1) the patient's medical and social history 2) The patient's current medications and supplements 3) The patient's height, weight, and BMI have been recorded in the chart   Hypothyroidism Lab Results  Component Value Date   TSH 0.58 11/10/2020   Stable, pt to continue levothyroxine  Followup: Return in about 1 year (around 11/10/2021).  Oliver Barre, MD 11/11/2020 3:23 PM [facility]

## 2020-11-11 NOTE — Assessment & Plan Note (Addendum)
Age and sex appropriate education and counseling updated with regular exercise and diet Referrals for preventative services - none needed; also for self pay cardiac CT score Immunizations addressed - for shingrix #1 today Smoking counseling  - none needed Evidence for depression or other mood disorder - none significant Most recent labs reviewed. I have personally reviewed and have noted: 1) the patient's medical and social history 2) The patient's current medications and supplements 3) The patient's height, weight, and BMI have been recorded in the chart

## 2020-11-23 DIAGNOSIS — J301 Allergic rhinitis due to pollen: Secondary | ICD-10-CM | POA: Diagnosis not present

## 2020-11-23 DIAGNOSIS — J3081 Allergic rhinitis due to animal (cat) (dog) hair and dander: Secondary | ICD-10-CM | POA: Diagnosis not present

## 2020-11-23 DIAGNOSIS — J3089 Other allergic rhinitis: Secondary | ICD-10-CM | POA: Diagnosis not present

## 2020-11-27 ENCOUNTER — Encounter: Payer: Self-pay | Admitting: Internal Medicine

## 2020-11-30 DIAGNOSIS — J301 Allergic rhinitis due to pollen: Secondary | ICD-10-CM | POA: Diagnosis not present

## 2020-11-30 DIAGNOSIS — J3081 Allergic rhinitis due to animal (cat) (dog) hair and dander: Secondary | ICD-10-CM | POA: Diagnosis not present

## 2020-11-30 DIAGNOSIS — J3089 Other allergic rhinitis: Secondary | ICD-10-CM | POA: Diagnosis not present

## 2020-12-13 ENCOUNTER — Ambulatory Visit: Payer: BC Managed Care – PPO

## 2020-12-14 DIAGNOSIS — J301 Allergic rhinitis due to pollen: Secondary | ICD-10-CM | POA: Diagnosis not present

## 2020-12-14 DIAGNOSIS — J3089 Other allergic rhinitis: Secondary | ICD-10-CM | POA: Diagnosis not present

## 2020-12-14 DIAGNOSIS — J3081 Allergic rhinitis due to animal (cat) (dog) hair and dander: Secondary | ICD-10-CM | POA: Diagnosis not present

## 2020-12-20 DIAGNOSIS — J3081 Allergic rhinitis due to animal (cat) (dog) hair and dander: Secondary | ICD-10-CM | POA: Diagnosis not present

## 2020-12-20 DIAGNOSIS — J301 Allergic rhinitis due to pollen: Secondary | ICD-10-CM | POA: Diagnosis not present

## 2020-12-20 DIAGNOSIS — J3089 Other allergic rhinitis: Secondary | ICD-10-CM | POA: Diagnosis not present

## 2020-12-21 ENCOUNTER — Other Ambulatory Visit: Payer: BC Managed Care – PPO

## 2020-12-21 DIAGNOSIS — J3089 Other allergic rhinitis: Secondary | ICD-10-CM | POA: Diagnosis not present

## 2020-12-21 DIAGNOSIS — T63441D Toxic effect of venom of bees, accidental (unintentional), subsequent encounter: Secondary | ICD-10-CM | POA: Diagnosis not present

## 2020-12-21 DIAGNOSIS — Z91013 Allergy to seafood: Secondary | ICD-10-CM | POA: Diagnosis not present

## 2020-12-21 DIAGNOSIS — J301 Allergic rhinitis due to pollen: Secondary | ICD-10-CM | POA: Diagnosis not present

## 2020-12-26 ENCOUNTER — Ambulatory Visit: Payer: BC Managed Care – PPO | Admitting: Endocrinology

## 2020-12-27 ENCOUNTER — Ambulatory Visit (INDEPENDENT_AMBULATORY_CARE_PROVIDER_SITE_OTHER): Payer: BC Managed Care – PPO

## 2020-12-27 DIAGNOSIS — Z23 Encounter for immunization: Secondary | ICD-10-CM

## 2020-12-28 DIAGNOSIS — J3081 Allergic rhinitis due to animal (cat) (dog) hair and dander: Secondary | ICD-10-CM | POA: Diagnosis not present

## 2020-12-28 DIAGNOSIS — J3089 Other allergic rhinitis: Secondary | ICD-10-CM | POA: Diagnosis not present

## 2020-12-28 DIAGNOSIS — J301 Allergic rhinitis due to pollen: Secondary | ICD-10-CM | POA: Diagnosis not present

## 2021-01-01 DIAGNOSIS — J3081 Allergic rhinitis due to animal (cat) (dog) hair and dander: Secondary | ICD-10-CM | POA: Diagnosis not present

## 2021-01-04 DIAGNOSIS — J301 Allergic rhinitis due to pollen: Secondary | ICD-10-CM | POA: Diagnosis not present

## 2021-01-04 DIAGNOSIS — J3081 Allergic rhinitis due to animal (cat) (dog) hair and dander: Secondary | ICD-10-CM | POA: Diagnosis not present

## 2021-01-04 DIAGNOSIS — J3089 Other allergic rhinitis: Secondary | ICD-10-CM | POA: Diagnosis not present

## 2021-01-08 ENCOUNTER — Other Ambulatory Visit: Payer: Self-pay

## 2021-01-08 ENCOUNTER — Ambulatory Visit (INDEPENDENT_AMBULATORY_CARE_PROVIDER_SITE_OTHER): Payer: BC Managed Care – PPO

## 2021-01-08 DIAGNOSIS — J3081 Allergic rhinitis due to animal (cat) (dog) hair and dander: Secondary | ICD-10-CM | POA: Diagnosis not present

## 2021-01-08 DIAGNOSIS — J301 Allergic rhinitis due to pollen: Secondary | ICD-10-CM | POA: Diagnosis not present

## 2021-01-08 DIAGNOSIS — Z23 Encounter for immunization: Secondary | ICD-10-CM

## 2021-01-08 DIAGNOSIS — J3089 Other allergic rhinitis: Secondary | ICD-10-CM | POA: Diagnosis not present

## 2021-01-10 DIAGNOSIS — J3081 Allergic rhinitis due to animal (cat) (dog) hair and dander: Secondary | ICD-10-CM | POA: Diagnosis not present

## 2021-01-11 ENCOUNTER — Ambulatory Visit: Payer: BC Managed Care – PPO

## 2021-01-15 ENCOUNTER — Other Ambulatory Visit: Payer: Self-pay | Admitting: Endocrinology

## 2021-01-18 DIAGNOSIS — J301 Allergic rhinitis due to pollen: Secondary | ICD-10-CM | POA: Diagnosis not present

## 2021-01-18 DIAGNOSIS — J3089 Other allergic rhinitis: Secondary | ICD-10-CM | POA: Diagnosis not present

## 2021-01-19 ENCOUNTER — Other Ambulatory Visit (INDEPENDENT_AMBULATORY_CARE_PROVIDER_SITE_OTHER): Payer: BC Managed Care – PPO

## 2021-01-19 ENCOUNTER — Other Ambulatory Visit: Payer: Self-pay

## 2021-01-19 DIAGNOSIS — E039 Hypothyroidism, unspecified: Secondary | ICD-10-CM

## 2021-01-19 DIAGNOSIS — E230 Hypopituitarism: Secondary | ICD-10-CM

## 2021-01-19 LAB — TSH: TSH: 0.61 u[IU]/mL (ref 0.35–5.50)

## 2021-01-19 LAB — CBC
HCT: 48.8 % (ref 39.0–52.0)
Hemoglobin: 16.4 g/dL (ref 13.0–17.0)
MCHC: 33.6 g/dL (ref 30.0–36.0)
MCV: 90.1 fl (ref 78.0–100.0)
Platelets: 288 10*3/uL (ref 150.0–400.0)
RBC: 5.42 Mil/uL (ref 4.22–5.81)
RDW: 13 % (ref 11.5–15.5)
WBC: 5.2 10*3/uL (ref 4.0–10.5)

## 2021-01-19 LAB — TESTOSTERONE: Testosterone: 608.57 ng/dL (ref 300.00–890.00)

## 2021-01-24 ENCOUNTER — Other Ambulatory Visit: Payer: Self-pay

## 2021-01-24 ENCOUNTER — Ambulatory Visit (INDEPENDENT_AMBULATORY_CARE_PROVIDER_SITE_OTHER): Payer: BC Managed Care – PPO | Admitting: Endocrinology

## 2021-01-24 VITALS — BP 102/66 | HR 67 | Ht 70.0 in | Wt 153.2 lb

## 2021-01-24 DIAGNOSIS — E230 Hypopituitarism: Secondary | ICD-10-CM

## 2021-01-24 DIAGNOSIS — E039 Hypothyroidism, unspecified: Secondary | ICD-10-CM

## 2021-01-24 NOTE — Progress Notes (Signed)
Patient ID: Joseph Santana, male   DOB: 1956-04-11, 65 y.o.   MRN: [id number]            Reason for Appointment:  Follow-up of low testosterone      History of Present Illness:   HYPOGONADISM: Prior history: He was diagnosed to have a low testosterone level in 2012 or so when he was coming to his physician with complaints of increased fatigue.  Not clear what his baseline testosterone level was but he had a level of about 130 in 2013 around the time when he was having back surgery No record of detail evaluation with pituitary function available He thinks he was tried on AndroGel initially but because of skin irritation he was changed to testosterone injections.  He may have been prescribed Axiron also but does not remember this He thinks he had significantly better with taking testosterone injections, apparently was taking about 100 mg every 2 weeks He stopped taking the injections 6-8 months prior to his initial consultation  as they were not renewed and he had a change in his PCP  RECENT history: On his initial consultation he was complaining of fatigue  Baseline testosterone was 208 with LH of 2.1 and normal prolactin, free testosterone not done by the lab  He had been on a trial of clomiphene half tablet, initially 3 times a week since 01/11/16. With this he had more energy after starting treatment With this his testosterone level came back to normal until 2/18 when it was 494, subsequently had been progressively declining and was 273 done in 1/19 This was despite taking 50 mg 5 days a week; with this he was complaining of more fatigue and sluggishness and decreased motivation   Also complaining of decreased libido  RECENT history: Previously on Fortesta gel but this was changed because of redness, irritation and sunburn-like reaction on the skin Because of the skin irritation he was changed to the Axiron preparation, but this could not keep his levels consistent  He has been able  to start Jatenzo in 06/2019 after approval through his insurance  He was advised to take his morning dose with breakfast and evening dose with dinner  He says in the morning he takes capsule with a bagel and peanut butter  Initially started on 158 mg and subsequently taking 198 mg since 02/2020 when his testosterone level had been lower  Again has not had any unusual fatigue, may feel a little tired in the afternoons as before He thinks his strength and stamina are fairly good, no change in libido Now his testosterone level has gone up to 609 but this was done about 2 hours after his medication in the morning  His hemoglobin and also last PSA have been consistently normal   He has had the following testosterone levels recently  Lab Results  Component Value Date   TESTOSTERONE 608.57 01/19/2021   TESTOSTERONE 531.52 06/13/2020   TESTOSTERONE 487.59 03/21/2020   TESTOSTERONE 257.37 (L) 02/07/2020    Lab Results  Component Value Date   LH 3.39 03/03/2017   LH 2.94 02/28/2016   LH 2.10 01/10/2016   Lab Results  Component Value Date   HGB 16.4 01/19/2021   Hypothyroidism was first diagnosed  about 30 years ago  At the time of diagnosis patient was having symptoms of  fatigue but he is not sure about all the other symptoms Most probably tested also because of strong family history of thyroid disease  He takes his thyroid  supplement regularly but over the last 4 to 6 weeks has started taking this at bedtime which is about 3 hours after dinner He did this because he could not be consistent with taking it an hour before breakfast in the morning  He had been on levothyroxine 150 mcg daily along with additional half of the 25 mcg dose His dose was increased when TSH was 4.4  No complaints of fatigue recently TSH levels as follows          Patient's weight history is as follows:  Wt Readings from Last 3 Encounters:  01/24/21 153 lb 3.2 oz (69.5 kg)  11/10/20 153 lb (69.4 kg)   06/21/20 150 lb (68 kg)    Thyroid function results have been as follows:  Lab Results  Component Value Date   TSH 0.61 01/19/2021   TSH 0.58 11/10/2020   TSH 3.11 06/13/2020   FREET4 1.12 06/13/2020   FREET4 0.91 02/07/2020   FREET4 1.04 06/29/2019      Past Medical History:  Diagnosis Date   ALLERGIC REACTION 07/22/2009   ALLERGIC RHINITIS 10/08/2006   B12 DEFICIENCY 05/14/2010   Carpal tunnel syndrome 12/31/2007   Degeneration of cervical intervertebral disc 12/31/2007   HYPERLIPIDEMIA 10/08/2006   Hypogonadism in male 06/26/2009   Qualifier: Diagnosis of  By: Lovell Sheehan MD, John E    HYPOTHYROIDISM 10/08/2006   Irritable bowel syndrome 03/03/2008   LUMBAR RADICULOPATHY, RIGHT 12/31/2006   OSTEOARTHRITIS, LOWER LEG, LEFT 05/10/2009   SINUSITIS - ACUTE-NOS 07/22/2009   TESTICULAR HYPOFUNCTION 06/26/2009    Past Surgical History:  Procedure Laterality Date   BACK SURGERY     I & D EXTREMITY Right 05/24/2020   Procedure: IRRIGATION AND DEBRIDEMENT RIGHT THUMB;  Surgeon: Tarry Kos, MD;  Location: [facility];  Service: Orthopedics;  Laterality: Right;   KNEE SURGERY     LUMBAR LAMINECTOMY  2008   with revision   SPINAL FUSION  2011   spinal fusion revision  2012    Family History  Problem Relation Age of Onset   Hypothyroidism Father    Asthma Other    Diabetes Other    Allergy (severe) Other    Hypothyroidism Mother    Hypothyroidism Sister    Hypothyroidism Brother     Social History:  reports that he has never smoked. He has never used smokeless tobacco. He reports that he does not drink alcohol and does not use drugs.  Allergies:  Allergies  Allergen Reactions   Iodine     IVP dye    Levofloxacin     Allergies as of 01/24/2021       Reactions   Iodine    IVP dye   Levofloxacin         Medication List        Accurate as of January 24, 2021  9:04 AM. If you have any questions, ask your nurse or doctor.          EPINEPHrine 0.3  mg/0.3 mL Soaj injection Commonly known as: EPI-PEN EpiPen 2-Pak 0.3 mg/0.3 mL injection, auto-injector   Jatenzo 198 MG Caps Generic drug: Testosterone Undecanoate TAKE 1 CAPSULE BY MOUTH AT BREAKFAST AND DINNER DAILY   levothyroxine 150 MCG tablet Commonly known as: SYNTHROID TAKE 1 TABLET BY MOUTH 30 MINUTES BEFORE BREAKFAST EVERY DAY.   levothyroxine 25 MCG tablet Commonly known as: SYNTHROID TAKE 1 TABLET BY MOUTH EVERY DAY   montelukast 10 MG tablet Commonly known as: SINGULAIR Take 10  mg by mouth at bedtime.   multivitamin capsule Take 1 capsule by mouth daily.   NASACORT ALLERGY 24HR NA Place into the nose.   nitroGLYCERIN 0.2 mg/hr patch Commonly known as: NITRODUR - Dosed in mg/24 hr Place 1/4 patch to the affected area daily.        Review of Systems  He has had chronic insomnia        Examination:    BP 102/66   Pulse 67   Ht 5\' 10"  (1.778 m)   Wt 153 lb 3.2 oz (69.5 kg)   SpO2 99%   BMI 21.98 kg/m     Assessment:  HYPOGONADISM:   He has hypogonadotropic hypogonadism with baseline testosterone of 208 and has been symptomatic He is taking Jatenzo 198 mg twice daily with food He has subjectively done very well with no unusual fatigue  Testosterone level is now consistently therapeutic around 600 although this was checked 2 hours after his medication Discussed timing of taking the medication with food and making sure he has a small amount of fat intake with breakfast and dinner Since his testosterone level at the recommended 5 to 6 hours after the doses probably a little lower current level is appropriate  HYPOTHYROIDISM, TSH upper normal previously and now with 162.5 mcg his TSH is below 1.0 but has no symptoms suggestive of hyperthyroidism or hypothyroidism Discussed relationship of TSH to his thyroid levels  Although he has had a stress fracture of his tibia last year he had this because of his running and unlikely to be related to  osteopenia   PLAN:  He will take his medications as discussed above  May continue to take his levothyroxine at bedtime at least 2 to 3 hours after any food in the evening  Follow-up in 6 months unless having any unusual fatigue   June 01/24/2021, 9:04 AM

## 2021-01-25 DIAGNOSIS — J301 Allergic rhinitis due to pollen: Secondary | ICD-10-CM | POA: Diagnosis not present

## 2021-01-25 DIAGNOSIS — J3089 Other allergic rhinitis: Secondary | ICD-10-CM | POA: Diagnosis not present

## 2021-02-02 DIAGNOSIS — J3089 Other allergic rhinitis: Secondary | ICD-10-CM | POA: Diagnosis not present

## 2021-02-02 DIAGNOSIS — J301 Allergic rhinitis due to pollen: Secondary | ICD-10-CM | POA: Diagnosis not present

## 2021-02-04 DIAGNOSIS — Z20822 Contact with and (suspected) exposure to covid-19: Secondary | ICD-10-CM | POA: Diagnosis not present

## 2021-02-05 ENCOUNTER — Ambulatory Visit: Payer: BC Managed Care – PPO | Admitting: Family Medicine

## 2021-02-05 ENCOUNTER — Other Ambulatory Visit: Payer: Self-pay

## 2021-02-05 ENCOUNTER — Telehealth (INDEPENDENT_AMBULATORY_CARE_PROVIDER_SITE_OTHER): Payer: BC Managed Care – PPO | Admitting: Internal Medicine

## 2021-02-05 DIAGNOSIS — U071 COVID-19: Secondary | ICD-10-CM | POA: Diagnosis not present

## 2021-02-05 MED ORDER — HYDROCODONE BIT-HOMATROP MBR 5-1.5 MG/5ML PO SOLN: 5.0000 mL | Freq: Four times a day (QID) | ORAL | 0 refills | Status: AC | PRN

## 2021-02-05 MED ORDER — ALBUTEROL SULFATE HFA 108 (90 BASE) MCG/ACT IN AERS: 2.0000 | INHALATION_SPRAY | Freq: Four times a day (QID) | RESPIRATORY_TRACT | 1 refills | Status: DC | PRN

## 2021-02-05 MED ORDER — NIRMATRELVIR/RITONAVIR (PAXLOVID)TABLET: 3.0000 | ORAL_TABLET | Freq: Two times a day (BID) | ORAL | 0 refills | Status: AC

## 2021-02-05 NOTE — Progress Notes (Signed)
Patient ID: Joseph Santana, male   DOB: 1955-06-25, 65 y.o.   MRN: [id number]  Virtual Visit via Video Note  I connected with Joseph Santana on Feb 05, 2021 at  1:40 PM EDT by a video enabled telemedicine application and verified that I am speaking with the correct person using two identifiers.  Location of all participants today Patient: at home Provider: at office   I discussed the limitations of evaluation and management by telemedicine and the availability of in person appointments. The patient expressed understanding and agreed to proceed.  History of Present Illness: Here with acute onset URI symptoms on Friday oct 21 after he and wife attended social gathering, to him feels flu like, achy all over, HA, feverish, ST, congestion and diarrhea; but no n/v or sob.  Taste and small intact, but found to be covid + by home testing on oct 23.  Still feels moderately ill, asking for the paxlovid.  Pt denies chest pain, increased sob or doe, wheezing, orthopnea, PND, increased LE swelling, palpitations, dizziness or syncope.   Pt denies polydipsia, polyuria, or new focal neuro s/s.   Pt denies recent wt loss, night sweats, loss of appetite, or other constitutional symptoms  Past Medical History:  Diagnosis Date   ALLERGIC REACTION 07/22/2009   ALLERGIC RHINITIS 10/08/2006   B12 DEFICIENCY 05/14/2010   Carpal tunnel syndrome 12/31/2007   Degeneration of cervical intervertebral disc 12/31/2007   HYPERLIPIDEMIA 10/08/2006   Hypogonadism in male 06/26/2009   Qualifier: Diagnosis of  By: Lovell Sheehan MD, Arlis Yale E    HYPOTHYROIDISM 10/08/2006   Irritable bowel syndrome 03/03/2008   LUMBAR RADICULOPATHY, RIGHT 12/31/2006   OSTEOARTHRITIS, LOWER LEG, LEFT 05/10/2009   SINUSITIS - ACUTE-NOS 07/22/2009   TESTICULAR HYPOFUNCTION 06/26/2009   Past Surgical History:  Procedure Laterality Date   BACK SURGERY     I & D EXTREMITY Right 05/24/2020   Procedure: IRRIGATION AND DEBRIDEMENT RIGHT THUMB;  Surgeon: Tarry Kos,  MD;  Location: [facility];  Service: Orthopedics;  Laterality: Right;   KNEE SURGERY     LUMBAR LAMINECTOMY  2008   with revision   SPINAL FUSION  2011   spinal fusion revision  2012    reports that he has never smoked. He has never used smokeless tobacco. He reports that he does not drink alcohol and does not use drugs. family history includes Allergy (severe) in an other family member; Asthma in an other family member; Diabetes in an other family member; Hypothyroidism in his brother, father, mother, and sister. Allergies  Allergen Reactions   Iodine     IVP dye    Levofloxacin    Current Outpatient Medications on File Prior to Visit  Medication Sig Dispense Refill   EPINEPHrine 0.3 mg/0.3 mL IJ SOAJ injection EpiPen 2-Pak 0.3 mg/0.3 mL injection, auto-injector     JATENZO 198 MG CAPS TAKE 1 CAPSULE BY MOUTH AT BREAKFAST AND DINNER DAILY 60 capsule 3   levothyroxine (SYNTHROID) 150 MCG tablet TAKE 1 TABLET BY MOUTH 30 MINUTES BEFORE BREAKFAST EVERY DAY. 90 tablet 1   levothyroxine (SYNTHROID) 25 MCG tablet TAKE 1 TABLET BY MOUTH EVERY DAY 90 tablet 1   montelukast (SINGULAIR) 10 MG tablet Take 10 mg by mouth at bedtime.     Multiple Vitamin (MULTIVITAMIN) capsule Take 1 capsule by mouth daily.     nitroGLYCERIN (NITRODUR - DOSED IN MG/24 HR) 0.2 mg/hr patch Place 1/4 patch to the affected area daily. 30 patch 0  Triamcinolone Acetonide (NASACORT ALLERGY 24HR NA) Place into the nose.     No current facility-administered medications on file prior to visit.   Observations/Objective: Alert, NAD, appropriate mood and affect, resps normal, cn 2-12 intact, moves all 4s, no visible rash or swelling Lab Results  Component Value Date   WBC 5.2 01/19/2021   HGB 16.4 01/19/2021   HCT 48.8 01/19/2021   PLT 288.0 01/19/2021   GLUCOSE 87 11/10/2020   CHOL 164 11/10/2020   TRIG 49.0 11/10/2020   HDL 57.70 11/10/2020   LDLDIRECT 146.2 11/19/2012   LDLCALC 97 11/10/2020    ALT 21 11/10/2020   AST 25 11/10/2020   NA 140 11/10/2020   K 4.4 11/10/2020   CL 101 11/10/2020   CREATININE 1.06 11/10/2020   BUN 17 11/10/2020   CO2 33 (H) 11/10/2020   TSH 0.61 01/19/2021   PSA 1.48 11/10/2020   Assessment and Plan: See notes  Follow Up Instructions: See notes   I discussed the assessment and treatment plan with the patient. The patient was provided an opportunity to ask questions and all were answered. The patient agreed with the plan and demonstrated an understanding of the instructions.   The patient was advised to call back or seek an in-person evaluation if the symptoms worsen or if the condition fails to improve as anticipated.   Oliver Barre, MD

## 2021-02-07 ENCOUNTER — Telehealth: Payer: Self-pay | Admitting: Internal Medicine

## 2021-02-07 NOTE — Telephone Encounter (Signed)
Pt called on 10.23.2022 and stateshe tested positive for covid today. Congestion and sore throat on Friday. Yesterday chills and achiness started. Highest 99.63F. Last dose of tylenol 12pm. Current pain level 6/10. Diarrhea present, denies blood. Drinking fluids and urinating normally. Denies chest pain, SOB, or any other symptoms at this time.  Chief complaint: Cold Symptoms   Advised to call PCP within 24 hours. Patient complied

## 2021-02-12 ENCOUNTER — Telehealth: Payer: Self-pay | Admitting: Internal Medicine

## 2021-02-12 ENCOUNTER — Encounter: Payer: Self-pay | Admitting: Internal Medicine

## 2021-02-12 DIAGNOSIS — U071 COVID-19: Secondary | ICD-10-CM | POA: Insufficient documentation

## 2021-02-12 NOTE — Patient Instructions (Signed)
Please take all new medication as prescribed 

## 2021-02-12 NOTE — Telephone Encounter (Signed)
Pt. Called on 10.29.2022 and stated he finished paxlovid but he still has covid symptoms. He has congestion, sore throat, headache, and fatigue. Denies fever at this time.   Pt. Was advised to see PCP within 3 days. Pt. Complied. Attempted to contact pt. To schedule appt. Please schedule\ if pt. Returns call.

## 2021-02-12 NOTE — Assessment & Plan Note (Signed)
With acute infection, for paxlovid course, cough med prn, and continue otc immidum prn; and  to f/u any worsening symptoms or concerns

## 2021-02-13 ENCOUNTER — Other Ambulatory Visit: Payer: Self-pay

## 2021-02-13 ENCOUNTER — Encounter: Payer: Self-pay | Admitting: Internal Medicine

## 2021-02-13 ENCOUNTER — Telehealth (INDEPENDENT_AMBULATORY_CARE_PROVIDER_SITE_OTHER): Payer: BC Managed Care – PPO | Admitting: Internal Medicine

## 2021-02-13 DIAGNOSIS — U071 COVID-19: Secondary | ICD-10-CM | POA: Diagnosis not present

## 2021-02-13 MED ORDER — PREDNISONE 20 MG PO TABS: 40.0000 mg | ORAL_TABLET | Freq: Every day | ORAL | 0 refills | Status: DC

## 2021-02-13 NOTE — Assessment & Plan Note (Signed)
Overall improving s/p paxlovid. Rx prednisone for ongoing sore throat, shortness of breath. Advised of cdc guidelines for quarantine.

## 2021-02-13 NOTE — Progress Notes (Signed)
Virtual Visit via Audio Note  I connected with Joseph Santana on 02/13/21 at  9:20 AM EDT by an audio-only enabled telemedicine application and verified that I am speaking with the correct person using two identifiers.  The patient and the provider were at separate locations throughout the entire encounter. Patient location: home, Provider location: work   I discussed the limitations of evaluation and management by telemedicine and the availability of in person appointments. The patient expressed understanding and agreed to proceed. The patient and the provider were the only parties present for the visit unless noted in HPI below.  History of Present Illness: The patient is a 65 y.o. man with visit for follow up covid-19. Started taking antiviral 02/05/21. Still having sore throat and diarrhea and fatigue.   Observations/Objective: A and O times 3, no SOB or cough during visit  Assessment and Plan: See problem oriented charting  Follow Up Instructions: rx prednisone, advised of cdc guidelines for quarantine  Visit time 11 minutes in non-face to face communication with patient and coordination of care.  I discussed the assessment and treatment plan with the patient. The patient was provided an opportunity to ask questions and all were answered. The patient agreed with the plan and demonstrated an understanding of the instructions.   The patient was advised to call back or seek an in-person evaluation if the symptoms worsen or if the condition fails to improve as anticipated.  Myrlene Broker, MD

## 2021-02-14 DIAGNOSIS — Z20822 Contact with and (suspected) exposure to covid-19: Secondary | ICD-10-CM | POA: Diagnosis not present

## 2021-02-19 MED ORDER — MOLNUPIRAVIR EUA 200MG CAPSULE: 4.0000 | ORAL_CAPSULE | Freq: Two times a day (BID) | ORAL | 0 refills | Status: AC

## 2021-02-19 NOTE — Addendum Note (Signed)
Addended by: Corwin Levins on: 02/19/2021 06:08 PM   Modules accepted: Orders

## 2021-02-19 NOTE — Telephone Encounter (Signed)
Unfortunately some patients do have this kind or "rebound" of symptoms and positive testing  - and no one know why this occurs  Ok for change tx by adding molnupiravir (the different antibiotic for covid)

## 2021-02-20 ENCOUNTER — Telehealth: Payer: Self-pay | Admitting: Internal Medicine

## 2021-02-20 NOTE — Telephone Encounter (Signed)
Please see encounter dated 02/12/21

## 2021-02-20 NOTE — Telephone Encounter (Signed)
Patient notified

## 2021-02-20 NOTE — Telephone Encounter (Signed)
Patient states pharmacy has a paxlovid  rx for him  Patient states he was not aware the medication was prescribed  Advised patient I did not see current rx for medication   Patient requesting a call back

## 2021-02-28 ENCOUNTER — Ambulatory Visit (INDEPENDENT_AMBULATORY_CARE_PROVIDER_SITE_OTHER): Payer: BC Managed Care – PPO | Admitting: Family Medicine

## 2021-02-28 ENCOUNTER — Encounter: Payer: Self-pay | Admitting: Family Medicine

## 2021-02-28 ENCOUNTER — Ambulatory Visit
Admission: RE | Admit: 2021-02-28 | Discharge: 2021-02-28 | Disposition: A | Discharge status: Home / Self Care | Payer: BC Managed Care – PPO | Source: Ambulatory Visit | Attending: Family Medicine | Admitting: Family Medicine

## 2021-02-28 VITALS — BP 110/72 | Ht 70.0 in | Wt 150.0 lb

## 2021-02-28 DIAGNOSIS — M25552 Pain in left hip: Secondary | ICD-10-CM | POA: Diagnosis not present

## 2021-02-28 NOTE — Progress Notes (Signed)
PCP: Corwin Levins, MD  Subjective:   HPI: Patient is a 65 y.o. male here for left hip pain.  Patient reports tibia improved to the point where it was not painful. However when he was about 10-11 weeks into marathon training running 50-55 miles a week he started to get pain in left quad, hip, IT band area. Described as a soreness during his run that worsened with running. Took a couple periods of time off and pain would improve. Most recently took 25 days off but when tried to run pain was so severe he had to stop. Did have pain with walking prior to the longer rest period. No swelling, bruising.  Past Medical History:  Diagnosis Date   ALLERGIC REACTION 07/22/2009   ALLERGIC RHINITIS 10/08/2006   B12 DEFICIENCY 05/14/2010   Carpal tunnel syndrome 12/31/2007   Degeneration of cervical intervertebral disc 12/31/2007   HYPERLIPIDEMIA 10/08/2006   Hypogonadism in male 06/26/2009   Qualifier: Diagnosis of  By: Lovell Sheehan MD, John E    HYPOTHYROIDISM 10/08/2006   Irritable bowel syndrome 03/03/2008   LUMBAR RADICULOPATHY, RIGHT 12/31/2006   OSTEOARTHRITIS, LOWER LEG, LEFT 05/10/2009   SINUSITIS - ACUTE-NOS 07/22/2009   TESTICULAR HYPOFUNCTION 06/26/2009    Current Outpatient Medications on File Prior to Visit  Medication Sig Dispense Refill   albuterol (VENTOLIN HFA) 108 (90 Base) MCG/ACT inhaler Inhale 2 puffs into the lungs every 6 (six) hours as needed for wheezing or shortness of breath. 8 g 1   EPINEPHrine 0.3 mg/0.3 mL IJ SOAJ injection EpiPen 2-Pak 0.3 mg/0.3 mL injection, auto-injector     JATENZO 198 MG CAPS TAKE 1 CAPSULE BY MOUTH AT BREAKFAST AND DINNER DAILY 60 capsule 3   levothyroxine (SYNTHROID) 150 MCG tablet TAKE 1 TABLET BY MOUTH 30 MINUTES BEFORE BREAKFAST EVERY DAY. 90 tablet 1   levothyroxine (SYNTHROID) 25 MCG tablet TAKE 1 TABLET BY MOUTH EVERY DAY 90 tablet 1   montelukast (SINGULAIR) 10 MG tablet Take 10 mg by mouth at bedtime.     Multiple Vitamin (MULTIVITAMIN) capsule  Take 1 capsule by mouth daily.     nitroGLYCERIN (NITRODUR - DOSED IN MG/24 HR) 0.2 mg/hr patch Place 1/4 patch to the affected area daily. 30 patch 0   predniSONE (DELTASONE) 20 MG tablet Take 2 tablets (40 mg total) by mouth daily with breakfast. 10 tablet 0   Triamcinolone Acetonide (NASACORT ALLERGY 24HR NA) Place into the nose.     No current facility-administered medications on file prior to visit.    Past Surgical History:  Procedure Laterality Date   BACK SURGERY     I & D EXTREMITY Right 05/24/2020   Procedure: IRRIGATION AND DEBRIDEMENT RIGHT THUMB;  Surgeon: Tarry Kos, MD;  Location: [facility];  Service: Orthopedics;  Laterality: Right;   KNEE SURGERY     LUMBAR LAMINECTOMY  2008   with revision   SPINAL FUSION  2011   spinal fusion revision  2012    Allergies  Allergen Reactions   Iodine     IVP dye    Levofloxacin     BP 110/72   Ht 5\' 10"  (1.778 m)   Wt 150 lb (68 kg)   BMI 21.52 kg/m   [facility] 12/22/2019 01/31/2020 02/14/2020 03/15/2020 02/28/2021  Frequency of aerobic exercise (# of days/week) 7 7 6 6 6   Average time in minutes 90 90 75 75 80  Frequency of strengthening activities (# of days/week) 3 1 0  0 1    No flowsheet data found.      Objective:  Physical Exam:  Gen: NAD, comfortable in exam room  Left hip: No deformity. FROM with 5/5 strength. No tenderness to palpation. NVI distally. Negative logroll Negative faber, fadir, and piriformis stretches. Negative fulcrum, positive hop test.   Assessment & Plan:  1. Left hip pain - history and exam concerning for proximal femur or hip stress fracture.  Has stopped running.  Will go ahead with MRI to confirm and assess location.  Treatment will depend on MRI findings.

## 2021-02-28 NOTE — Patient Instructions (Signed)
I'm concerned you have a stress fracture of your left hip, proximal femur. We will go ahead with an MRI to assess and I'll call you with the results. No running in meantime as we discussed. Hopefully we can get this done quickly.

## 2021-03-01 ENCOUNTER — Other Ambulatory Visit: Payer: Self-pay | Admitting: *Deleted

## 2021-03-01 DIAGNOSIS — J301 Allergic rhinitis due to pollen: Secondary | ICD-10-CM | POA: Diagnosis not present

## 2021-03-01 DIAGNOSIS — J3081 Allergic rhinitis due to animal (cat) (dog) hair and dander: Secondary | ICD-10-CM | POA: Diagnosis not present

## 2021-03-01 DIAGNOSIS — J3089 Other allergic rhinitis: Secondary | ICD-10-CM | POA: Diagnosis not present

## 2021-03-07 ENCOUNTER — Ambulatory Visit
Admission: RE | Admit: 2021-03-07 | Discharge: 2021-03-07 | Disposition: A | Discharge status: Home / Self Care | Payer: BC Managed Care – PPO | Source: Ambulatory Visit | Attending: Family Medicine | Admitting: Family Medicine

## 2021-03-07 ENCOUNTER — Other Ambulatory Visit: Payer: Self-pay

## 2021-03-07 DIAGNOSIS — J301 Allergic rhinitis due to pollen: Secondary | ICD-10-CM | POA: Diagnosis not present

## 2021-03-07 DIAGNOSIS — J3081 Allergic rhinitis due to animal (cat) (dog) hair and dander: Secondary | ICD-10-CM | POA: Diagnosis not present

## 2021-03-07 DIAGNOSIS — M25552 Pain in left hip: Secondary | ICD-10-CM

## 2021-03-07 DIAGNOSIS — J3089 Other allergic rhinitis: Secondary | ICD-10-CM | POA: Diagnosis not present

## 2021-03-12 ENCOUNTER — Ambulatory Visit (INDEPENDENT_AMBULATORY_CARE_PROVIDER_SITE_OTHER): Payer: BC Managed Care – PPO | Admitting: Family Medicine

## 2021-03-12 VITALS — BP 112/72 | Ht 70.0 in | Wt 150.0 lb

## 2021-03-12 DIAGNOSIS — M25552 Pain in left hip: Secondary | ICD-10-CM

## 2021-03-13 ENCOUNTER — Encounter: Payer: Self-pay | Admitting: Family Medicine

## 2021-03-13 NOTE — Progress Notes (Signed)
MRI reviewed and discussed with patient.  There is a focus on medial side of proximal femur of edema (just distal to neck)  without fracture line consistent with stress reaction.  We reviewed protocol for this from [facility] and provided him with a copy.  Stressed importance that he follows this in 3 week intervals and not return to running at all until we have evaluated him, ensured negative hop and fulcrum tests.  He can weight bear fully as he is without pain.  Swimming, stationary bike if no pain.  No strength training of left leg.  Given his typical fast pace we also discussed when he advances to next phase of protocol he will have to start with markedly slower pace.  He verbalized understanding.  F/u in 3 weeks.

## 2021-03-14 DIAGNOSIS — J301 Allergic rhinitis due to pollen: Secondary | ICD-10-CM | POA: Diagnosis not present

## 2021-03-14 DIAGNOSIS — J3089 Other allergic rhinitis: Secondary | ICD-10-CM | POA: Diagnosis not present

## 2021-03-15 ENCOUNTER — Other Ambulatory Visit: Payer: Self-pay | Admitting: Endocrinology

## 2021-03-16 ENCOUNTER — Other Ambulatory Visit: Payer: BC Managed Care – PPO

## 2021-03-18 ENCOUNTER — Other Ambulatory Visit: Payer: BC Managed Care – PPO

## 2021-03-21 DIAGNOSIS — J3081 Allergic rhinitis due to animal (cat) (dog) hair and dander: Secondary | ICD-10-CM | POA: Diagnosis not present

## 2021-03-22 DIAGNOSIS — J3089 Other allergic rhinitis: Secondary | ICD-10-CM | POA: Diagnosis not present

## 2021-03-22 DIAGNOSIS — J301 Allergic rhinitis due to pollen: Secondary | ICD-10-CM | POA: Diagnosis not present

## 2021-03-26 DIAGNOSIS — J301 Allergic rhinitis due to pollen: Secondary | ICD-10-CM | POA: Diagnosis not present

## 2021-03-26 DIAGNOSIS — T63441D Toxic effect of venom of bees, accidental (unintentional), subsequent encounter: Secondary | ICD-10-CM | POA: Diagnosis not present

## 2021-03-26 DIAGNOSIS — J3089 Other allergic rhinitis: Secondary | ICD-10-CM | POA: Diagnosis not present

## 2021-03-26 DIAGNOSIS — Z91013 Allergy to seafood: Secondary | ICD-10-CM | POA: Diagnosis not present

## 2021-03-27 ENCOUNTER — Encounter: Payer: Self-pay | Admitting: Family Medicine

## 2021-03-28 ENCOUNTER — Encounter: Payer: Self-pay | Admitting: Family Medicine

## 2021-03-29 ENCOUNTER — Encounter: Payer: Self-pay | Admitting: Endocrinology

## 2021-03-29 ENCOUNTER — Ambulatory Visit
Admission: RE | Admit: 2021-03-29 | Discharge: 2021-03-29 | Disposition: A | Discharge status: Home / Self Care | Payer: BC Managed Care – PPO | Source: Ambulatory Visit | Attending: Family Medicine | Admitting: Family Medicine

## 2021-03-29 ENCOUNTER — Other Ambulatory Visit: Payer: Self-pay

## 2021-03-29 ENCOUNTER — Other Ambulatory Visit: Payer: Self-pay | Admitting: Endocrinology

## 2021-03-29 ENCOUNTER — Encounter: Payer: Self-pay | Admitting: Family Medicine

## 2021-03-29 DIAGNOSIS — M84362G Stress fracture, left tibia, subsequent encounter for fracture with delayed healing: Secondary | ICD-10-CM

## 2021-03-29 DIAGNOSIS — J3089 Other allergic rhinitis: Secondary | ICD-10-CM | POA: Diagnosis not present

## 2021-03-29 DIAGNOSIS — J301 Allergic rhinitis due to pollen: Secondary | ICD-10-CM | POA: Diagnosis not present

## 2021-03-29 MED ORDER — JATENZO 198 MG PO CAPS: ORAL_CAPSULE | ORAL | 1 refills | Status: DC

## 2021-03-29 NOTE — Telephone Encounter (Signed)
See other patient messages.

## 2021-03-29 NOTE — Addendum Note (Signed)
Addended by: Rutha Bouchard E on: 03/29/2021 08:43 AM   Modules accepted: Orders

## 2021-04-02 ENCOUNTER — Ambulatory Visit (INDEPENDENT_AMBULATORY_CARE_PROVIDER_SITE_OTHER): Payer: BC Managed Care – PPO | Admitting: Family Medicine

## 2021-04-02 VITALS — BP 124/72 | Ht 70.0 in | Wt 150.0 lb

## 2021-04-02 DIAGNOSIS — M84352D Stress fracture, left femur, subsequent encounter for fracture with routine healing: Secondary | ICD-10-CM

## 2021-04-03 ENCOUNTER — Encounter: Payer: Self-pay | Admitting: Family Medicine

## 2021-04-03 NOTE — Progress Notes (Signed)
PCP: Corwin Levins, MD  Subjective:   HPI: Patient is a 65 y.o. male here for left hip pain.  11/16: Patient reports tibia improved to the point where it was not painful. However when he was about 10-11 weeks into marathon training running 50-55 miles a week he started to get pain in left quad, hip, IT band area. Described as a soreness during his run that worsened with running. Took a couple periods of time off and pain would improve. Most recently took 25 days off but when tried to run pain was so severe he had to stop. Did have pain with walking prior to the longer rest period. No swelling, bruising.  12/19: Patient reports he has been doing well. Hip not bothering him currently. Has been able to use stationary bike without pain - 30 minutes at a time (8 miles). Not doing any lower body strengthening. Has been more than 3 weeks since he has run. Had massage lower extremity a couple weeks ago and when they pushed on location of old tibial stress fracture caused very severe pain - due to get MRI on 12/31.  Past Medical History:  Diagnosis Date   ALLERGIC REACTION 07/22/2009   ALLERGIC RHINITIS 10/08/2006   B12 DEFICIENCY 05/14/2010   Carpal tunnel syndrome 12/31/2007   Degeneration of cervical intervertebral disc 12/31/2007   HYPERLIPIDEMIA 10/08/2006   Hypogonadism in male 06/26/2009   Qualifier: Diagnosis of  By: Lovell Sheehan MD, John E    HYPOTHYROIDISM 10/08/2006   Irritable bowel syndrome 03/03/2008   LUMBAR RADICULOPATHY, RIGHT 12/31/2006   OSTEOARTHRITIS, LOWER LEG, LEFT 05/10/2009   SINUSITIS - ACUTE-NOS 07/22/2009   TESTICULAR HYPOFUNCTION 06/26/2009    Current Outpatient Medications on File Prior to Visit  Medication Sig Dispense Refill   albuterol (VENTOLIN HFA) 108 (90 Base) MCG/ACT inhaler Inhale 2 puffs into the lungs every 6 (six) hours as needed for wheezing or shortness of breath. 8 g 1   EPINEPHrine 0.3 mg/0.3 mL IJ SOAJ injection EpiPen 2-Pak 0.3 mg/0.3 mL injection,  auto-injector     JATENZO 198 MG CAPS 1 capsule with breakfast and 1 capsule with dinner daily 180 capsule 1   levothyroxine (SYNTHROID) 150 MCG tablet TAKE 1 TABLET BY MOUTH 30 MINUTES BEFORE BREAKFAST EVERY DAY. 90 tablet 1   levothyroxine (SYNTHROID) 25 MCG tablet TAKE 1 TABLET BY MOUTH EVERY DAY 90 tablet 1   montelukast (SINGULAIR) 10 MG tablet Take 10 mg by mouth at bedtime.     Multiple Vitamin (MULTIVITAMIN) capsule Take 1 capsule by mouth daily.     nitroGLYCERIN (NITRODUR - DOSED IN MG/24 HR) 0.2 mg/hr patch Place 1/4 patch to the affected area daily. 30 patch 0   predniSONE (DELTASONE) 20 MG tablet Take 2 tablets (40 mg total) by mouth daily with breakfast. 10 tablet 0   Triamcinolone Acetonide (NASACORT ALLERGY 24HR NA) Place into the nose.     No current facility-administered medications on file prior to visit.    Past Surgical History:  Procedure Laterality Date   BACK SURGERY     I & D EXTREMITY Right 05/24/2020   Procedure: IRRIGATION AND DEBRIDEMENT RIGHT THUMB;  Surgeon: Tarry Kos, MD;  Location: [facility];  Service: Orthopedics;  Laterality: Right;   KNEE SURGERY     LUMBAR LAMINECTOMY  2008   with revision   SPINAL FUSION  2011   spinal fusion revision  2012    Allergies  Allergen Reactions   Iodine  IVP dye    Levofloxacin     BP 124/72    Ht 5\' 10"  (1.778 m)    Wt 150 lb (68 kg)    BMI 21.52 kg/m   [facility] Adult [facility] 12/22/2019 01/31/2020 02/14/2020 03/15/2020 02/28/2021 04/02/2021  Frequency of aerobic exercise (# of days/week) 7 7 6 6 6 6   Average time in minutes 90 90 75 75 80 80  Frequency of strengthening activities (# of days/week) 3 1 0 0 1 1    No flowsheet data found.      Objective:  Physical Exam:  Gen: NAD, comfortable in exam room  Left hip: No deformity. FROM with 5/5 strength. No tenderness to palpation. NVI distally. Negative logroll Negative Hop, fulcrum.   Assessment & Plan:  1. Left  hip pain - Proximal femur stress reaction.  Has completed 3 weeks of protocol we reviewed (started at phase 2 with stress reaction instead of stress fracture).  Start phase 3 - jogging every other day with walk/jog.  Body weight exercise if not painful.  Printed protocol again.  Plan to f/u in 3 weeks prior to starting phase 4.

## 2021-04-05 DIAGNOSIS — J3089 Other allergic rhinitis: Secondary | ICD-10-CM | POA: Diagnosis not present

## 2021-04-05 DIAGNOSIS — J301 Allergic rhinitis due to pollen: Secondary | ICD-10-CM | POA: Diagnosis not present

## 2021-04-05 DIAGNOSIS — J3081 Allergic rhinitis due to animal (cat) (dog) hair and dander: Secondary | ICD-10-CM | POA: Diagnosis not present

## 2021-04-09 ENCOUNTER — Other Ambulatory Visit: Payer: Self-pay | Admitting: Endocrinology

## 2021-04-12 ENCOUNTER — Telehealth: Payer: BC Managed Care – PPO | Admitting: Physician Assistant

## 2021-04-12 DIAGNOSIS — R0981 Nasal congestion: Secondary | ICD-10-CM

## 2021-04-12 DIAGNOSIS — J301 Allergic rhinitis due to pollen: Secondary | ICD-10-CM | POA: Diagnosis not present

## 2021-04-12 DIAGNOSIS — J3089 Other allergic rhinitis: Secondary | ICD-10-CM | POA: Diagnosis not present

## 2021-04-12 MED ORDER — AMOXICILLIN-POT CLAVULANATE 875-125 MG PO TABS: 1.0000 | ORAL_TABLET | Freq: Two times a day (BID) | ORAL | 0 refills | Status: DC

## 2021-04-12 MED ORDER — IPRATROPIUM BROMIDE 0.03 % NA SOLN: 2.0000 | Freq: Two times a day (BID) | NASAL | 0 refills | Status: DC

## 2021-04-12 MED ORDER — FLUTICASONE PROPIONATE 50 MCG/ACT NA SUSP: 2.0000 | Freq: Every day | NASAL | 0 refills | Status: DC

## 2021-04-12 NOTE — Addendum Note (Signed)
Addended by: Margaretann Loveless on: 04/12/2021 09:53 AM   Modules accepted: Orders

## 2021-04-12 NOTE — Progress Notes (Signed)
E-Visit for Sinus Problems  We are sorry that you are not feeling well.  Here is how we plan to help!  Based on what you have shared with me it looks like you have sinusitis.  Sinusitis is inflammation and infection in the sinus cavities of the head.  Based on your presentation I believe you most likely have Acute Viral Sinusitis.This is an infection most likely caused by a virus. There is not specific treatment for viral sinusitis other than to help you with the symptoms until the infection runs its course.  You may use an oral decongestant such as Mucinex D or if you have glaucoma or high blood pressure use plain Mucinex. Saline nasal spray help and can safely be used as often as needed for congestion, I have prescribed: Fluticasone nasal spray two sprays in each nostril once a day and Ipratropium Bromide nasal spray 0.03% 2 sprays in eah nostril 2-3 times a day  Some authorities believe that zinc sprays or the use of Echinacea may shorten the course of your symptoms.  Sinus infections are not as easily transmitted as other respiratory infection, however we still recommend that you avoid close contact with loved ones, especially the very young and elderly.  Remember to wash your hands thoroughly throughout the day as this is the number one way to prevent the spread of infection!  Prolonged use of sudafed can cause rebound congestion. So I would recommend to stop using that if you have been using for more than 5-7 days.   Home Care: Only take medications as instructed by your medical team. Do not take these medications with alcohol. A steam or ultrasonic humidifier can help congestion.  You can place a towel over your head and breathe in the steam from hot water coming from a faucet. Avoid close contacts especially the very young and the elderly. Cover your mouth when you cough or sneeze. Always remember to wash your hands.  Get Help Right Away If: You develop worsening fever or sinus  pain. You develop a severe head ache or visual changes. Your symptoms persist after you have completed your treatment plan.  Make sure you Understand these instructions. Will watch your condition. Will get help right away if you are not doing well or get worse.   Thank you for choosing an e-visit.  Your e-visit answers were reviewed by a board certified advanced clinical practitioner to complete your personal care plan. Depending upon the condition, your plan could have included both over the counter or prescription medications.  Please review your pharmacy choice. Make sure the pharmacy is open so you can pick up prescription now. If there is a problem, you may contact your provider through [employer] and have the prescription routed to another pharmacy.  Your safety is important to [place]. If you have drug allergies check your prescription carefully.   For the next 24 hours you can use MyChart to ask questions about today's visit, request a non-urgent call back, or ask for a work or school excuse. You will get an email in the next two days asking about your experience. I hope that your e-visit has been valuable and will speed your recovery.  I provided 5 minutes of non face-to-face time during this encounter for chart review and documentation.

## 2021-04-13 DIAGNOSIS — H3500 Unspecified background retinopathy: Secondary | ICD-10-CM | POA: Diagnosis not present

## 2021-04-14 ENCOUNTER — Other Ambulatory Visit: Payer: Self-pay

## 2021-04-14 ENCOUNTER — Ambulatory Visit (HOSPITAL_BASED_OUTPATIENT_CLINIC_OR_DEPARTMENT_OTHER)
Admission: RE | Admit: 2021-04-14 | Discharge: 2021-04-14 | Disposition: A | Discharge status: Home / Self Care | Payer: BC Managed Care – PPO | Source: Ambulatory Visit | Attending: Family Medicine | Admitting: Family Medicine

## 2021-04-14 DIAGNOSIS — Z0389 Encounter for observation for other suspected diseases and conditions ruled out: Secondary | ICD-10-CM | POA: Diagnosis not present

## 2021-04-14 DIAGNOSIS — M84362G Stress fracture, left tibia, subsequent encounter for fracture with delayed healing: Secondary | ICD-10-CM | POA: Diagnosis not present

## 2021-04-14 DIAGNOSIS — Z8781 Personal history of (healed) traumatic fracture: Secondary | ICD-10-CM | POA: Diagnosis not present

## 2021-04-18 DIAGNOSIS — H43812 Vitreous degeneration, left eye: Secondary | ICD-10-CM | POA: Diagnosis not present

## 2021-04-18 DIAGNOSIS — H348122 Central retinal vein occlusion, left eye, stable: Secondary | ICD-10-CM | POA: Diagnosis not present

## 2021-04-18 DIAGNOSIS — H3562 Retinal hemorrhage, left eye: Secondary | ICD-10-CM | POA: Diagnosis not present

## 2021-04-18 DIAGNOSIS — H43821 Vitreomacular adhesion, right eye: Secondary | ICD-10-CM | POA: Diagnosis not present

## 2021-04-19 ENCOUNTER — Encounter: Payer: Self-pay | Admitting: Family Medicine

## 2021-04-20 DIAGNOSIS — J301 Allergic rhinitis due to pollen: Secondary | ICD-10-CM | POA: Diagnosis not present

## 2021-04-20 DIAGNOSIS — J3089 Other allergic rhinitis: Secondary | ICD-10-CM | POA: Diagnosis not present

## 2021-04-27 ENCOUNTER — Other Ambulatory Visit (HOSPITAL_COMMUNITY): Payer: Self-pay

## 2021-04-27 DIAGNOSIS — H43812 Vitreous degeneration, left eye: Secondary | ICD-10-CM | POA: Diagnosis not present

## 2021-04-27 DIAGNOSIS — H3500 Unspecified background retinopathy: Secondary | ICD-10-CM | POA: Diagnosis not present

## 2021-04-30 DIAGNOSIS — J3089 Other allergic rhinitis: Secondary | ICD-10-CM | POA: Diagnosis not present

## 2021-04-30 DIAGNOSIS — J301 Allergic rhinitis due to pollen: Secondary | ICD-10-CM | POA: Diagnosis not present

## 2021-05-01 DIAGNOSIS — J3089 Other allergic rhinitis: Secondary | ICD-10-CM | POA: Diagnosis not present

## 2021-05-01 DIAGNOSIS — J301 Allergic rhinitis due to pollen: Secondary | ICD-10-CM | POA: Diagnosis not present

## 2021-05-10 DIAGNOSIS — J3081 Allergic rhinitis due to animal (cat) (dog) hair and dander: Secondary | ICD-10-CM | POA: Diagnosis not present

## 2021-05-10 DIAGNOSIS — J301 Allergic rhinitis due to pollen: Secondary | ICD-10-CM | POA: Diagnosis not present

## 2021-05-10 DIAGNOSIS — J3089 Other allergic rhinitis: Secondary | ICD-10-CM | POA: Diagnosis not present

## 2021-05-17 DIAGNOSIS — J301 Allergic rhinitis due to pollen: Secondary | ICD-10-CM | POA: Diagnosis not present

## 2021-05-17 DIAGNOSIS — J3089 Other allergic rhinitis: Secondary | ICD-10-CM | POA: Diagnosis not present

## 2021-05-23 DIAGNOSIS — J3089 Other allergic rhinitis: Secondary | ICD-10-CM | POA: Diagnosis not present

## 2021-05-23 DIAGNOSIS — J301 Allergic rhinitis due to pollen: Secondary | ICD-10-CM | POA: Diagnosis not present

## 2021-06-06 DIAGNOSIS — J301 Allergic rhinitis due to pollen: Secondary | ICD-10-CM | POA: Diagnosis not present

## 2021-06-06 DIAGNOSIS — J3089 Other allergic rhinitis: Secondary | ICD-10-CM | POA: Diagnosis not present

## 2021-06-06 DIAGNOSIS — J3081 Allergic rhinitis due to animal (cat) (dog) hair and dander: Secondary | ICD-10-CM | POA: Diagnosis not present

## 2021-06-14 ENCOUNTER — Telehealth: Payer: Self-pay

## 2021-06-14 ENCOUNTER — Other Ambulatory Visit: Payer: Self-pay

## 2021-06-14 ENCOUNTER — Other Ambulatory Visit (HOSPITAL_COMMUNITY): Payer: Self-pay

## 2021-06-14 NOTE — Telephone Encounter (Signed)
Patient called stating his insurance says a PA for this year needs to be done for his Jatenzo. Patient only has 1 pill left do you think this can be expedited? ?

## 2021-06-14 NOTE — Telephone Encounter (Signed)
Patient Advocate Encounter ? ?Prior Authorization for [employer] 198mg  caps has been approved.   ? ?PA# ? ?Effective dates: 06/14/21 through 06/15/22 ? ?Per Test Claim Patients co-pay is $10. (30 days)  ? ?Patient must fill w/[facility] mail order pharmacy. ? ?Patient Advocate ?Fax: [phone]  ?

## 2021-06-14 NOTE — Telephone Encounter (Signed)
Patient Advocate Encounter ?  ?Received notification from patient calls that prior authorization for Jatenzo 198mg  caps is required by his/her insurance [facility]. ?  ?PA submitted on 06/14/21 ? ?Key#: [id number] ? ?Status is pending ?   ?[facility] will continue to follow: ? ?Patient Advocate ?Fax: [phone]  ?

## 2021-06-18 DIAGNOSIS — J301 Allergic rhinitis due to pollen: Secondary | ICD-10-CM | POA: Diagnosis not present

## 2021-06-18 DIAGNOSIS — J3081 Allergic rhinitis due to animal (cat) (dog) hair and dander: Secondary | ICD-10-CM | POA: Diagnosis not present

## 2021-06-18 DIAGNOSIS — J3089 Other allergic rhinitis: Secondary | ICD-10-CM | POA: Diagnosis not present

## 2021-06-25 ENCOUNTER — Encounter: Payer: Self-pay | Admitting: Family Medicine

## 2021-06-25 ENCOUNTER — Ambulatory Visit (INDEPENDENT_AMBULATORY_CARE_PROVIDER_SITE_OTHER): Payer: BC Managed Care – PPO | Admitting: Family Medicine

## 2021-06-25 DIAGNOSIS — M222X2 Patellofemoral disorders, left knee: Secondary | ICD-10-CM | POA: Diagnosis not present

## 2021-06-25 NOTE — Patient Instructions (Signed)
You have patellofemoral syndrome. ?Avoid painful activities when possible (often deep squats, lunges, leg press bother this). ?Cross train with swimming, cycling with low resistance, elliptical if needed for the next week then go back to running - I wouldn't start with the long run though, I would skip the one for this weekend. ?Do home exercises 3 sets of 10 twice a day as directed. ?Consider formal physical therapy. ?Avoid flat shoes, barefoot walking as much as possible. ?Icing 15 minutes at a time 3-4 times a day as needed. ?Tylenol or ibuprofen as needed for pain. ?Knee sleeve for support. ?Let me know how you're doing in 2 weeks. ?Moving forward doing these exercises, IT band stretch 3 times a week and arch supports may help prevent issues but the evidence is weak for this. ? ?

## 2021-06-25 NOTE — Progress Notes (Signed)
PCP: Corwin Levins, MD ? ?Subjective:  ? ?HPI: ?Patient is a 66 y.o. male here for left knee pain. ? ?Patient reports he started to get pain anterior left knee on Friday. ?Knee painful with feeling of weakness/decreased strength. ?No swelling or bruising. ?Has been icing, using compression sleeve, taking some ibuprofen. ?Has been training for [employer] which is next month. ?About a week ago he ran 20 miles slower than the prior long 19 mile run (about 30+ seconds slower). ?Unsure if this is related but often when he's injured he notices a drop in times before this. ? ?Past Medical History:  ?Diagnosis Date  ? ALLERGIC REACTION 07/22/2009  ? ALLERGIC RHINITIS 10/08/2006  ? B12 DEFICIENCY 05/14/2010  ? Carpal tunnel syndrome 12/31/2007  ? Degeneration of cervical intervertebral disc 12/31/2007  ? HYPERLIPIDEMIA 10/08/2006  ? Hypogonadism in male 06/26/2009  ? Qualifier: Diagnosis of  By: Lovell Sheehan MD, John E   ? HYPOTHYROIDISM 10/08/2006  ? Irritable bowel syndrome 03/03/2008  ? LUMBAR RADICULOPATHY, RIGHT 12/31/2006  ? OSTEOARTHRITIS, LOWER LEG, LEFT 05/10/2009  ? SINUSITIS - ACUTE-NOS 07/22/2009  ? TESTICULAR HYPOFUNCTION 06/26/2009  ? ? ?Current Outpatient Medications on File Prior to Visit  ?Medication Sig Dispense Refill  ? albuterol (VENTOLIN HFA) 108 (90 Base) MCG/ACT inhaler Inhale 2 puffs into the lungs every 6 (six) hours as needed for wheezing or shortness of breath. 8 g 1  ? amoxicillin-clavulanate (AUGMENTIN) 875-125 MG tablet Take 1 tablet by mouth 2 (two) times daily. 14 tablet 0  ? EPINEPHrine 0.3 mg/0.3 mL IJ SOAJ injection EpiPen 2-Pak 0.3 mg/0.3 mL injection, auto-injector    ? fluticasone (FLONASE) 50 MCG/ACT nasal spray Place 2 sprays into both nostrils daily. 16 g 0  ? ipratropium (ATROVENT) 0.03 % nasal spray Place 2 sprays into both nostrils every 12 (twelve) hours. 30 mL 0  ? JATENZO 198 MG CAPS 1 capsule with breakfast and 1 capsule with dinner daily 180 capsule 1  ? levothyroxine (SYNTHROID) 150 MCG  tablet TAKE 1 TABLET BY MOUTH 30 MINUTES BEFORE BREAKFAST EVERY DAY. 90 tablet 1  ? levothyroxine (SYNTHROID) 25 MCG tablet TAKE 1 TABLET BY MOUTH EVERY DAY 90 tablet 1  ? montelukast (SINGULAIR) 10 MG tablet Take 10 mg by mouth at bedtime.    ? Multiple Vitamin (MULTIVITAMIN) capsule Take 1 capsule by mouth daily.    ? nitroGLYCERIN (NITRODUR - DOSED IN MG/24 HR) 0.2 mg/hr patch Place 1/4 patch to the affected area daily. 30 patch 0  ? predniSONE (DELTASONE) 20 MG tablet Take 2 tablets (40 mg total) by mouth daily with breakfast. 10 tablet 0  ? Triamcinolone Acetonide (NASACORT ALLERGY 24HR NA) Place into the nose.    ? ?No current facility-administered medications on file prior to visit.  ? ? ?Past Surgical History:  ?Procedure Laterality Date  ? BACK SURGERY    ? I & D EXTREMITY Right 05/24/2020  ? Procedure: IRRIGATION AND DEBRIDEMENT RIGHT THUMB;  Surgeon: Tarry Kos, MD;  Location: [facility];  Service: Orthopedics;  Laterality: Right;  ? KNEE SURGERY    ? LUMBAR LAMINECTOMY  2008  ? with revision  ? SPINAL FUSION  2011  ? spinal fusion revision  2012  ? ? ?Allergies  ?Allergen Reactions  ? Iodine   ?  IVP dye ?  ? Levofloxacin   ? ? ?BP 120/70   Ht 5\' 10"  (1.778 m)   Wt 150 lb (68 kg)   BMI 21.52 kg/m?  ? ?  [facility] 12/22/2019 01/31/2020 02/14/2020 03/15/2020 02/28/2021 04/02/2021 06/25/2021  ?Frequency of aerobic exercise (# of days/week) 7 7 6 6 6 6 4   ?Average time in minutes 90 90 75 75 80 80 90  ?Frequency of strengthening activities (# of days/week) 3 1 0 0 1 1 2   ? ? ?No flowsheet data found. ? ?    ?Objective:  ?Physical Exam: ? ?Gen: NAD, comfortable in exam room ? ?Left knee: ?Mild VMO atrophy.  No other gross deformity, ecchymoses, swelling. ?No TTP currently - points to posterior patella as location of pain. ?FROM with normal strength. Except 5-/5 hip abduction left. ?Negative ant/post drawers. Negative valgus/varus testing. Negative lachman. ?Negative  mcmurrays, apleys. ?NV intact distally. ?Positive Obers but negative noble. ?  ?Assessment & Plan:  ?1. Left knee pain - 2/2 patellofemoral syndrome.  Home exercises reviewed focusing on VMO and hip abduction strengthening.  Cross training next week.  Icing, knee sleeve for support.  Let know how he's doing in 2 weeks.  Exam otherwise reassuring. ?

## 2021-06-25 NOTE — Progress Notes (Signed)
Patient was instructed in 10 minutes of therapeutic exercises for left knee pain to improve strength, ROM and function according to my instructions and plan of care by a Certified Athletic Trainer during the office visit.  Proper technique shown and discussed, handout provided.  All questions discussed and answered.  ?

## 2021-06-26 ENCOUNTER — Encounter: Payer: Self-pay | Admitting: Family Medicine

## 2021-06-26 DIAGNOSIS — J3089 Other allergic rhinitis: Secondary | ICD-10-CM | POA: Diagnosis not present

## 2021-06-26 DIAGNOSIS — J301 Allergic rhinitis due to pollen: Secondary | ICD-10-CM | POA: Diagnosis not present

## 2021-06-26 DIAGNOSIS — J3081 Allergic rhinitis due to animal (cat) (dog) hair and dander: Secondary | ICD-10-CM | POA: Diagnosis not present

## 2021-06-28 ENCOUNTER — Encounter: Payer: Self-pay | Admitting: Family Medicine

## 2021-07-04 DIAGNOSIS — J3081 Allergic rhinitis due to animal (cat) (dog) hair and dander: Secondary | ICD-10-CM | POA: Diagnosis not present

## 2021-07-04 DIAGNOSIS — J3089 Other allergic rhinitis: Secondary | ICD-10-CM | POA: Diagnosis not present

## 2021-07-04 DIAGNOSIS — J301 Allergic rhinitis due to pollen: Secondary | ICD-10-CM | POA: Diagnosis not present

## 2021-07-10 DIAGNOSIS — J3081 Allergic rhinitis due to animal (cat) (dog) hair and dander: Secondary | ICD-10-CM | POA: Diagnosis not present

## 2021-07-10 DIAGNOSIS — J3089 Other allergic rhinitis: Secondary | ICD-10-CM | POA: Diagnosis not present

## 2021-07-10 DIAGNOSIS — J301 Allergic rhinitis due to pollen: Secondary | ICD-10-CM | POA: Diagnosis not present

## 2021-07-18 DIAGNOSIS — H2513 Age-related nuclear cataract, bilateral: Secondary | ICD-10-CM | POA: Diagnosis not present

## 2021-07-18 DIAGNOSIS — H43812 Vitreous degeneration, left eye: Secondary | ICD-10-CM | POA: Diagnosis not present

## 2021-07-18 DIAGNOSIS — H3562 Retinal hemorrhage, left eye: Secondary | ICD-10-CM | POA: Diagnosis not present

## 2021-07-18 DIAGNOSIS — H348122 Central retinal vein occlusion, left eye, stable: Secondary | ICD-10-CM | POA: Diagnosis not present

## 2021-07-19 ENCOUNTER — Other Ambulatory Visit (INDEPENDENT_AMBULATORY_CARE_PROVIDER_SITE_OTHER): Payer: BC Managed Care – PPO

## 2021-07-19 DIAGNOSIS — E039 Hypothyroidism, unspecified: Secondary | ICD-10-CM | POA: Diagnosis not present

## 2021-07-19 DIAGNOSIS — E230 Hypopituitarism: Secondary | ICD-10-CM

## 2021-07-19 DIAGNOSIS — J3089 Other allergic rhinitis: Secondary | ICD-10-CM | POA: Diagnosis not present

## 2021-07-19 DIAGNOSIS — J301 Allergic rhinitis due to pollen: Secondary | ICD-10-CM | POA: Diagnosis not present

## 2021-07-19 LAB — CBC
HCT: 44.4 % (ref 39.0–52.0)
Hemoglobin: 15.1 g/dL (ref 13.0–17.0)
MCHC: 33.9 g/dL (ref 30.0–36.0)
MCV: 90.4 fl (ref 78.0–100.0)
Platelets: 239 10*3/uL (ref 150.0–400.0)
RBC: 4.91 Mil/uL (ref 4.22–5.81)
RDW: 12.7 % (ref 11.5–15.5)
WBC: 4.9 10*3/uL (ref 4.0–10.5)

## 2021-07-19 LAB — T4, FREE: Free T4: 1.17 ng/dL (ref 0.60–1.60)

## 2021-07-19 LAB — TESTOSTERONE: Testosterone: 728.73 ng/dL (ref 300.00–890.00)

## 2021-07-19 LAB — TSH: TSH: 0.7 u[IU]/mL (ref 0.35–5.50)

## 2021-07-25 DIAGNOSIS — J3081 Allergic rhinitis due to animal (cat) (dog) hair and dander: Secondary | ICD-10-CM | POA: Diagnosis not present

## 2021-07-25 DIAGNOSIS — J3089 Other allergic rhinitis: Secondary | ICD-10-CM | POA: Diagnosis not present

## 2021-07-25 DIAGNOSIS — J301 Allergic rhinitis due to pollen: Secondary | ICD-10-CM | POA: Diagnosis not present

## 2021-07-27 ENCOUNTER — Encounter: Payer: Self-pay | Admitting: Endocrinology

## 2021-07-27 ENCOUNTER — Ambulatory Visit (INDEPENDENT_AMBULATORY_CARE_PROVIDER_SITE_OTHER): Payer: BC Managed Care – PPO | Admitting: Endocrinology

## 2021-07-27 VITALS — BP 116/74 | HR 61 | Ht 70.0 in | Wt 155.4 lb

## 2021-07-27 DIAGNOSIS — E230 Hypopituitarism: Secondary | ICD-10-CM

## 2021-07-27 DIAGNOSIS — E039 Hypothyroidism, unspecified: Secondary | ICD-10-CM | POA: Diagnosis not present

## 2021-07-27 NOTE — Progress Notes (Signed)
Patient ID: Joseph Santana, male   DOB: 1955/06/13, 66 y.o.   MRN: [id number] ? ?       ? ? ? ?Reason for Appointment:  Follow-up of low testosterone  ? ? ? ? History of Present Illness:  ? ?HYPOGONADISM: ?Prior history: ?He was diagnosed to have a low testosterone level in 2012 or so when he was coming to his physician with complaints of increased fatigue.  Not clear what his baseline testosterone level was but he had a level of about 130 in 2013 around the time when he was having back surgery ?No record of detail evaluation with pituitary function available ?He thinks he was tried on AndroGel initially but because of skin irritation he was changed to testosterone injections.  He may have been prescribed Axiron also but does not remember this ?He thinks he had significantly better with taking testosterone injections, apparently was taking about 100 mg every 2 weeks ?He stopped taking the injections 6-8 months prior to his initial consultation  as they were not renewed and he had a change in his PCP ? ?RECENT history: ?On his initial consultation he was complaining of fatigue  ?Baseline testosterone was 208 with LH of 2.1 and normal prolactin, free testosterone not done by the lab ? ?He had been on a trial of clomiphene half tablet, initially 3 times a week since 01/11/16. With this he had more energy after starting treatment ?With this his testosterone level came back to normal until 2/18 when it was 494, subsequently had been progressively declining and was 273 done in 1/19 ?This was despite taking 50 mg 5 days a week; with this he was complaining of more fatigue and sluggishness and decreased motivation   ?Also complaining of decreased libido ? ?RECENT history: ?Previously on Fortesta gel but this was changed because of redness, irritation and sunburn-like reaction on the skin ?Because of the skin irritation he was changed to the Axiron preparation, but this could not keep his levels consistent ? ?He has been able  to start Jatenzo in 06/2019 after approval through his insurance ? ?He was advised to take his morning dose with breakfast and evening dose with dinner  ?Now in the morning he takes capsule with cereal and skim milk although sometime ago he was also getting peanut butter ? ?Initially started on 158 mg and subsequently taking 198 mg since 02/2020 when his testosterone level was about 250 ? ?He has good energy level and is able to do practice for marathon running that is coming up ?He has had occasional daytime somnolence and may wake up feeling groggy ?Also has a difficulty with sleep and late insomnia ? ?His testosterone level was done about 3 hours after his morning dose and appears to be getting progressively higher ? ?His hemoglobin and also last PSA have been consistently normal  ? ?He has had the following testosterone levels: ? ?Lab Results  ?Component Value Date  ? TESTOSTERONE 728.73 07/19/2021  ? TESTOSTERONE 608.57 01/19/2021  ? TESTOSTERONE 531.52 06/13/2020  ? TESTOSTERONE 487.59 03/21/2020  ? ? ?Lab Results  ?Component Value Date  ? LH 3.39 03/03/2017  ? LH 2.94 02/28/2016  ? LH 2.10 01/10/2016  ? ?Lab Results  ?Component Value Date  ? HGB 15.1 07/19/2021  ? ?Hypothyroidism was first diagnosed  about 30 years ago ? ?At the time of diagnosis patient was having symptoms of  fatigue but he is not sure about all the other symptoms ?Most probably tested also because  of strong family history of thyroid disease ? ?He takes his thyroid supplement regularly but over the last 4 to 6 weeks has started taking this at bedtime which is about 3 hours after dinner ?He did this because he could not be consistent with taking it an hour before breakfast in the morning ? ?He had been on levothyroxine 150 mcg daily along with additional half of the 25 mcg dose ?His dose was increased when TSH was 4.4 ? ?Occasionally he may feel sleepy or tired and he was thinking his thyroid level was low ?TSH levels as follows ? ?         ?Patient's weight history is as follows: ? ?Wt Readings from Last 3 Encounters:  ?07/27/21 155 lb 6.4 oz (70.5 kg)  ?06/25/21 150 lb (68 kg)  ?04/02/21 150 lb (68 kg)  ? ? ?Thyroid function results have been as follows: ? ?Lab Results  ?Component Value Date  ? TSH 0.70 07/19/2021  ? TSH 0.61 01/19/2021  ? TSH 0.58 11/10/2020  ? FREET4 1.17 07/19/2021  ? FREET4 1.12 06/13/2020  ? FREET4 0.91 02/07/2020  ? ? ? ? ?Past Medical History:  ?Diagnosis Date  ? ALLERGIC REACTION 07/22/2009  ? ALLERGIC RHINITIS 10/08/2006  ? B12 DEFICIENCY 05/14/2010  ? Carpal tunnel syndrome 12/31/2007  ? Degeneration of cervical intervertebral disc 12/31/2007  ? HYPERLIPIDEMIA 10/08/2006  ? Hypogonadism in male 06/26/2009  ? Qualifier: Diagnosis of  By: Lovell Sheehan MD, John E   ? HYPOTHYROIDISM 10/08/2006  ? Irritable bowel syndrome 03/03/2008  ? LUMBAR RADICULOPATHY, RIGHT 12/31/2006  ? OSTEOARTHRITIS, LOWER LEG, LEFT 05/10/2009  ? SINUSITIS - ACUTE-NOS 07/22/2009  ? TESTICULAR HYPOFUNCTION 06/26/2009  ? ? ?Past Surgical History:  ?Procedure Laterality Date  ? BACK SURGERY    ? I & D EXTREMITY Right 05/24/2020  ? Procedure: IRRIGATION AND DEBRIDEMENT RIGHT THUMB;  Surgeon: Tarry Kos, MD;  Location: [facility];  Service: Orthopedics;  Laterality: Right;  ? KNEE SURGERY    ? LUMBAR LAMINECTOMY  2008  ? with revision  ? SPINAL FUSION  2011  ? spinal fusion revision  2012  ? ? ?Family History  ?Problem Relation Age of Onset  ? Hypothyroidism Father   ? Asthma Other   ? Diabetes Other   ? Allergy (severe) Other   ? Hypothyroidism Mother   ? Hypothyroidism Sister   ? Hypothyroidism Brother   ? ? ?Social History:  reports that he has never smoked. He has never used smokeless tobacco. He reports that he does not drink alcohol and does not use drugs. ? ?Allergies:  ?Allergies  ?Allergen Reactions  ? Iodine   ?  IVP dye ?  ? Levofloxacin   ? ? ?Allergies as of 07/27/2021   ? ?   Reactions  ? Iodine   ? IVP dye  ? Levofloxacin   ? ?  ? ?  ?Medication  List  ?  ? ?  ? Accurate as of July 27, 2021  8:50 AM. If you have any questions, ask your nurse or doctor.  ?  ?  ? ?  ? ?albuterol 108 (90 Base) MCG/ACT inhaler ?Commonly known as: VENTOLIN HFA ?Inhale 2 puffs into the lungs every 6 (six) hours as needed for wheezing or shortness of breath. ?  ?amoxicillin-clavulanate 875-125 MG tablet ?Commonly known as: AUGMENTIN ?Take 1 tablet by mouth 2 (two) times daily. ?  ?EPINEPHrine 0.3 mg/0.3 mL Soaj injection ?Commonly known as: EPI-PEN ?EpiPen 2-Pak 0.3 mg/0.3  mL injection, auto-injector ?  ?fluticasone 50 MCG/ACT nasal spray ?Commonly known as: FLONASE ?Place 2 sprays into both nostrils daily. ?  ?ipratropium 0.03 % nasal spray ?Commonly known as: ATROVENT ?Place 2 sprays into both nostrils every 12 (twelve) hours. ?  ?Jatenzo 198 MG Caps ?Generic drug: Testosterone Undecanoate ?1 capsule with breakfast and 1 capsule with dinner daily ?  ?levothyroxine 150 MCG tablet ?Commonly known as: SYNTHROID ?TAKE 1 TABLET BY MOUTH 30 MINUTES BEFORE BREAKFAST EVERY DAY. ?  ?levothyroxine 25 MCG tablet ?Commonly known as: SYNTHROID ?TAKE 1 TABLET BY MOUTH EVERY DAY ?  ?montelukast 10 MG tablet ?Commonly known as: SINGULAIR ?Take 10 mg by mouth at bedtime. ?  ?multivitamin capsule ?Take 1 capsule by mouth daily. ?  ?NASACORT ALLERGY 24HR NA ?Place into the nose. ?  ?nitroGLYCERIN 0.2 mg/hr patch ?Commonly known as: NITRODUR - Dosed in mg/24 hr ?Place 1/4 patch to the affected area daily. ?  ?predniSONE 20 MG tablet ?Commonly known as: DELTASONE ?Take 2 tablets (40 mg total) by mouth daily with breakfast. ?  ? ?  ? ? ?Review of Systems ? ?He has had chronic insomnia ?      ? Examination: ?  ? BP 116/74 (BP Location: Left Arm, Patient Position: Sitting, Cuff Size: Normal)   Pulse 61   Ht 5\' 10"  (1.778 m)   Wt 155 lb 6.4 oz (70.5 kg)   SpO2 98%   BMI 22.30 kg/m?  ? ? ? ?Assessment: ? ?HYPOGONADISM:  ? ?He has hypogonadotropic hypogonadism with baseline testosterone of 208 and  has been symptomatic ?He is taking Jatenzo 198 mg twice daily with food ?He has subjectively done very well and is exercising vigorously preparing for marathon ?Currently however his testosterone level is high

## 2021-07-31 DIAGNOSIS — J301 Allergic rhinitis due to pollen: Secondary | ICD-10-CM | POA: Diagnosis not present

## 2021-07-31 DIAGNOSIS — J3081 Allergic rhinitis due to animal (cat) (dog) hair and dander: Secondary | ICD-10-CM | POA: Diagnosis not present

## 2021-07-31 DIAGNOSIS — J3089 Other allergic rhinitis: Secondary | ICD-10-CM | POA: Diagnosis not present

## 2021-08-10 ENCOUNTER — Ambulatory Visit (INDEPENDENT_AMBULATORY_CARE_PROVIDER_SITE_OTHER): Payer: BC Managed Care – PPO | Admitting: Family Medicine

## 2021-08-10 ENCOUNTER — Encounter: Payer: Self-pay | Admitting: Family Medicine

## 2021-08-10 ENCOUNTER — Telehealth: Payer: BC Managed Care – PPO | Admitting: Emergency Medicine

## 2021-08-10 VITALS — BP 110/66 | HR 55 | Temp 97.8°F | Ht 70.0 in | Wt 149.0 lb

## 2021-08-10 DIAGNOSIS — J0140 Acute pansinusitis, unspecified: Secondary | ICD-10-CM

## 2021-08-10 DIAGNOSIS — J029 Acute pharyngitis, unspecified: Secondary | ICD-10-CM

## 2021-08-10 DIAGNOSIS — R059 Cough, unspecified: Secondary | ICD-10-CM

## 2021-08-10 LAB — POC COVID19 BINAXNOW: SARS Coronavirus 2 Ag: NEGATIVE

## 2021-08-10 MED ORDER — PREDNISONE 20 MG PO TABS: 40.0000 mg | ORAL_TABLET | Freq: Every day | ORAL | 0 refills | Status: DC

## 2021-08-10 MED ORDER — AMOXICILLIN-POT CLAVULANATE 875-125 MG PO TABS: 1.0000 | ORAL_TABLET | Freq: Two times a day (BID) | ORAL | 0 refills | Status: DC

## 2021-08-10 NOTE — Patient Instructions (Signed)
If you are getting worse over the next 2 to 3 days or if you are just not feeling any better then you can start on the antibiotic and steroids. ? ?Continue to drink plenty of water. ? ?Follow-up if you are not back to baseline after completing the antibiotic (if you end up taking the antibiotic) ?

## 2021-08-10 NOTE — Progress Notes (Signed)
Based on what you shared with me, I feel your condition warrants further evaluation and I recommend that you be seen in a face to face visit. Based on your history of recurrent sinusitis and limited treatment options based on prior antibiotic use/failure of antibiotics, you should be seen in person.  You can see your primary care provider, or alternatively, you can be seen in an urgent care.  ?  ?NOTE: There will be NO CHARGE for this eVisit ?  ?If you are having a true medical emergency please call 911.   ?  ? For an urgent face to face visit, [facility] has six urgent care centers for your convenience:  ?  ? [facility] ?Get Driving Directions ?[phone] ?[phone] ?[place], [place] [address] ?  ? [facility]) ?Get Driving Directions ?[phone] ?[address] ?[place], [place] [address] ? ?[facility]) ?Get Driving Directions ?[phone] ?[address] ?[place],  [place]  [address] ? ?[facility] ?Get Driving Directions ?[phone] ?[address], [address] ?[place], [place] [address] ?  ?[facility] [place] ?Get Driving Directions  ?[phone] ?[address] ?[address] ?[place], [place] [address] ?  ?[facility] ?Get Driving Directions ?[phone] ?[address] ?[place], [place] [address] ? ?Your MyChart E-visit questionnaire answers were reviewed by a board certified advanced clinical practitioner to complete your personal care plan based on your specific symptoms.  Thank you for using e-Visits. ? ? ?   ?

## 2021-08-10 NOTE — Progress Notes (Signed)
? ?Acute Office Visit ? ?Subjective:  ? ?  ?Patient ID: Joseph Santana, male    DOB: [birth year], 66 y.o.   MRN: [id number] ? ?Chief Complaint  ?Patient presents with  ? Cough  ?  X3 days.   ? Nasal Congestion  ?  Along with allergies  ? Sore Throat  ? ? ?HPI ?Patient is in today for a 3 day history of dry cough, sore throat, nasal congestion, and sinus pressure.  ?States he feels like he has a sinus infection. States he usually needs oral steroids to clear his symptoms.  ? ?Denies fever, chills, dizziness, chest pain, palpitations, shortness of breath, abdominal pain, N/V/D, urinary symptoms, LE edema.  ? ?Taking mucinex and cough drops.  ? ?Gets allergy shots weekly.  ? ?Past Medical History:  ?Diagnosis Date  ? ALLERGIC REACTION 07/22/2009  ? ALLERGIC RHINITIS 10/08/2006  ? B12 DEFICIENCY 05/14/2010  ? Carpal tunnel syndrome 12/31/2007  ? Degeneration of cervical intervertebral disc 12/31/2007  ? HYPERLIPIDEMIA 10/08/2006  ? Hypogonadism in male 06/26/2009  ? Qualifier: Diagnosis of  By: Lovell Sheehan MD, John E   ? HYPOTHYROIDISM 10/08/2006  ? Irritable bowel syndrome 03/03/2008  ? LUMBAR RADICULOPATHY, RIGHT 12/31/2006  ? OSTEOARTHRITIS, LOWER LEG, LEFT 05/10/2009  ? SINUSITIS - ACUTE-NOS 07/22/2009  ? TESTICULAR HYPOFUNCTION 06/26/2009  ? ?Current Outpatient Medications on File Prior to Visit  ?Medication Sig Dispense Refill  ? albuterol (VENTOLIN HFA) 108 (90 Base) MCG/ACT inhaler Inhale 2 puffs into the lungs every 6 (six) hours as needed for wheezing or shortness of breath. 8 g 1  ? EPINEPHrine 0.3 mg/0.3 mL IJ SOAJ injection EpiPen 2-Pak 0.3 mg/0.3 mL injection, auto-injector    ? fluticasone (FLONASE) 50 MCG/ACT nasal spray Place 2 sprays into both nostrils daily. 16 g 0  ? ipratropium (ATROVENT) 0.03 % nasal spray Place 2 sprays into both nostrils every 12 (twelve) hours. 30 mL 0  ? JATENZO 198 MG CAPS 1 capsule with breakfast and 1 capsule with dinner daily 180 capsule 1  ? levothyroxine (SYNTHROID) 150 MCG tablet TAKE 1  TABLET BY MOUTH 30 MINUTES BEFORE BREAKFAST EVERY DAY. 90 tablet 1  ? levothyroxine (SYNTHROID) 25 MCG tablet TAKE 1 TABLET BY MOUTH EVERY DAY 90 tablet 1  ? montelukast (SINGULAIR) 10 MG tablet Take 10 mg by mouth at bedtime.    ? Multiple Vitamin (MULTIVITAMIN) capsule Take 1 capsule by mouth daily.    ? nitroGLYCERIN (NITRODUR - DOSED IN MG/24 HR) 0.2 mg/hr patch Place 1/4 patch to the affected area daily. 30 patch 0  ? Triamcinolone Acetonide (NASACORT ALLERGY 24HR NA) Place into the nose.    ? ?No current facility-administered medications on file prior to visit.  ? ? ? ?ROS ?Pertinent positives and negatives in the history of present illness. ? ? ?   ?Objective:  ?  ?BP 110/66 (BP Location: Left Arm, Patient Position: Sitting, Cuff Size: Large)   Pulse (!) 55   Temp 97.8 ?F (36.6 ?C) (Temporal)   Ht 5\' 10"  (1.778 m)   Wt 149 lb (67.6 kg)   SpO2 99%   BMI 21.38 kg/m?  ?BP Readings from Last 3 Encounters:  ?08/10/21 110/66  ?07/27/21 116/74  ?06/25/21 120/70  ? ?  ? ?Physical Exam ? ?Results for orders placed or performed in visit on 08/10/21  ?POC COVID-19  ?Result Value Ref Range  ? SARS Coronavirus 2 Ag Negative Negative  ? ?Alert and in no distress. Frontal sinus tenderness to palpation. Tympanic membranes  and canals are normal. Pharyngeal area is mildly erythematous. Neck is supple without adenopathy or thyromegaly. Cardiac exam shows a regular rate and rhythm.  Lungs are clear to auscultation. ? ? ?   ?Assessment & Plan:  ? ?Problem List Items Addressed This Visit   ?None ?Visit Diagnoses   ? ? Acute non-recurrent pansinusitis    -  Primary  ? Relevant Medications  ? predniSONE (DELTASONE) 20 MG tablet  ? amoxicillin-clavulanate (AUGMENTIN) 875-125 MG tablet  ? Cough, unspecified type      ? Relevant Orders  ? POC COVID-19 (Completed)  ? Sore throat      ? Relevant Orders  ? POC COVID-19 (Completed)  ? ?  ? ? ?Meds ordered this encounter  ?Medications  ? predniSONE (DELTASONE) 20 MG tablet  ?  Sig:  Take 2 tablets (40 mg total) by mouth daily with breakfast.  ?  Dispense:  10 tablet  ?  Refill:  0  ?  Order Specific Question:   Supervising Provider  ?  Answer:   Hillard Danker A [[id number]]  ? amoxicillin-clavulanate (AUGMENTIN) 875-125 MG tablet  ?  Sig: Take 1 tablet by mouth 2 (two) times daily.  ?  Dispense:  20 tablet  ?  Refill:  0  ?  Order Specific Question:   Supervising Provider  ?  Answer:   Hillard Danker A [[id number]]  ? ?Covid test is negative.  ?States Zpak does not work nor does Amoxil.  ?Augmentin prescribed. He also requests oral steroids.  ?Discussed holding off on starting the medication for 2-3 days until he is certain he is not clearing the infection.  ?Discussed symptomatic treatment.  ?Follow up as needed.  ? ?Return if symptoms worsen or fail to improve. ? ?Hetty Blend, NP-C ? ? ?

## 2021-08-12 DIAGNOSIS — Z91013 Allergy to seafood: Secondary | ICD-10-CM | POA: Insufficient documentation

## 2021-08-12 DIAGNOSIS — R509 Fever, unspecified: Secondary | ICD-10-CM | POA: Diagnosis not present

## 2021-08-12 DIAGNOSIS — J069 Acute upper respiratory infection, unspecified: Secondary | ICD-10-CM | POA: Diagnosis not present

## 2021-08-22 ENCOUNTER — Encounter: Payer: Self-pay | Admitting: Internal Medicine

## 2021-08-22 ENCOUNTER — Ambulatory Visit (INDEPENDENT_AMBULATORY_CARE_PROVIDER_SITE_OTHER): Payer: BC Managed Care – PPO | Admitting: Internal Medicine

## 2021-08-22 ENCOUNTER — Encounter: Payer: Self-pay | Admitting: Endocrinology

## 2021-08-22 ENCOUNTER — Other Ambulatory Visit: Payer: Self-pay | Admitting: Endocrinology

## 2021-08-22 VITALS — BP 114/66 | HR 63 | Temp 98.3°F | Ht 70.0 in | Wt 154.0 lb

## 2021-08-22 DIAGNOSIS — E7800 Pure hypercholesterolemia, unspecified: Secondary | ICD-10-CM

## 2021-08-22 DIAGNOSIS — E559 Vitamin D deficiency, unspecified: Secondary | ICD-10-CM | POA: Diagnosis not present

## 2021-08-22 DIAGNOSIS — Z0001 Encounter for general adult medical examination with abnormal findings: Secondary | ICD-10-CM | POA: Diagnosis not present

## 2021-08-22 DIAGNOSIS — E230 Hypopituitarism: Secondary | ICD-10-CM

## 2021-08-22 DIAGNOSIS — J309 Allergic rhinitis, unspecified: Secondary | ICD-10-CM

## 2021-08-22 DIAGNOSIS — E039 Hypothyroidism, unspecified: Secondary | ICD-10-CM

## 2021-08-22 DIAGNOSIS — E538 Deficiency of other specified B group vitamins: Secondary | ICD-10-CM

## 2021-08-22 DIAGNOSIS — J069 Acute upper respiratory infection, unspecified: Secondary | ICD-10-CM

## 2021-08-22 DIAGNOSIS — Z125 Encounter for screening for malignant neoplasm of prostate: Secondary | ICD-10-CM | POA: Diagnosis not present

## 2021-08-22 LAB — HEPATIC FUNCTION PANEL
ALT: 78 U/L — ABNORMAL HIGH (ref 0–53)
AST: 48 U/L — ABNORMAL HIGH (ref 0–37)
Albumin: 4.1 g/dL (ref 3.5–5.2)
Alkaline Phosphatase: 63 U/L (ref 39–117)
Bilirubin, Direct: 0.1 mg/dL (ref 0.0–0.3)
Total Bilirubin: 0.4 mg/dL (ref 0.2–1.2)
Total Protein: 6.6 g/dL (ref 6.0–8.3)

## 2021-08-22 LAB — LIPID PANEL
Cholesterol: 174 mg/dL (ref 0–200)
HDL: 45.4 mg/dL (ref >39.00)
LDL Cholesterol: 116 mg/dL — ABNORMAL HIGH (ref 0–99)
NonHDL: 128.38
Total CHOL/HDL Ratio: 4
Triglycerides: 64 mg/dL (ref 0.0–149.0)
VLDL: 12.8 mg/dL (ref 0.0–40.0)

## 2021-08-22 LAB — CBC WITH DIFFERENTIAL/PLATELET
Basophils Absolute: 0.1 10*3/uL (ref 0.0–0.1)
Basophils Relative: 1.1 % (ref 0.0–3.0)
Eosinophils Absolute: 0.2 10*3/uL (ref 0.0–0.7)
Eosinophils Relative: 2.5 % (ref 0.0–5.0)
HCT: 44.5 % (ref 39.0–52.0)
Hemoglobin: 15.2 g/dL (ref 13.0–17.0)
Lymphocytes Relative: 22.7 % (ref 12.0–46.0)
Lymphs Abs: 1.4 10*3/uL (ref 0.7–4.0)
MCHC: 34.2 g/dL (ref 30.0–36.0)
MCV: 89.3 fl (ref 78.0–100.0)
Monocytes Absolute: 0.7 10*3/uL (ref 0.1–1.0)
Monocytes Relative: 12.2 % — ABNORMAL HIGH (ref 3.0–12.0)
Neutro Abs: 3.8 10*3/uL (ref 1.4–7.7)
Neutrophils Relative %: 61.5 % (ref 43.0–77.0)
Platelets: 296 10*3/uL (ref 150.0–400.0)
RBC: 4.98 Mil/uL (ref 4.22–5.81)
RDW: 12.7 % (ref 11.5–15.5)
WBC: 6.1 10*3/uL (ref 4.0–10.5)

## 2021-08-22 LAB — BASIC METABOLIC PANEL
BUN: 22 mg/dL (ref 6–23)
CO2: 29 mEq/L (ref 19–32)
Calcium: 9.2 mg/dL (ref 8.4–10.5)
Chloride: 99 mEq/L (ref 96–112)
Creatinine, Ser: 1.05 mg/dL (ref 0.40–1.50)
GFR: 75.49 mL/min (ref >60.00)
Glucose, Bld: 94 mg/dL (ref 70–99)
Potassium: 4.4 mEq/L (ref 3.5–5.1)
Sodium: 135 mEq/L (ref 135–145)

## 2021-08-22 LAB — PSA: PSA: 1.21 ng/mL (ref 0.10–4.00)

## 2021-08-22 LAB — TESTOSTERONE: Testosterone: 984.13 ng/dL — ABNORMAL HIGH (ref 300.00–890.00)

## 2021-08-22 LAB — VITAMIN B12: Vitamin B-12: >1504 pg/mL — ABNORMAL HIGH (ref 211–911)

## 2021-08-22 LAB — VITAMIN D 25 HYDROXY (VIT D DEFICIENCY, FRACTURES): VITD: 74.8 ng/mL (ref 30.00–100.00)

## 2021-08-22 MED ORDER — METHYLPREDNISOLONE ACETATE 80 MG/ML IJ SUSP
80.0000 mg | Freq: Once | INTRAMUSCULAR | Status: AC
  Administered 2021-08-22: 80 mg via INTRAMUSCULAR

## 2021-08-22 MED ORDER — JATENZO 158 MG PO CAPS: 1.0000 | ORAL_CAPSULE | Freq: Two times a day (BID) | ORAL | 3 refills | Status: DC

## 2021-08-22 MED ORDER — PREDNISONE 10 MG PO TABS: ORAL_TABLET | ORAL | 0 refills | Status: DC

## 2021-08-22 NOTE — Patient Instructions (Signed)
You had the steroid shot today ° °Please take all new medication as prescribed - the prednisone ° °Please continue all other medications as before, and refills have been done if requested. ° °Please have the pharmacy call with any other refills you may need. ° °Please continue your efforts at being more active, low cholesterol diet, and weight control. ° °You are otherwise up to date with prevention measures today. ° °Please keep your appointments with your specialists as you may have planned ° °Please go to the LAB at the blood drawing area for the tests to be done ° °You will be contacted by phone if any changes need to be made immediately.  Otherwise, you will receive a letter about your results with an explanation, but please check with MyChart first. ° °Please remember to sign up for MyChart if you have not done so, as this will be important to you in the future with finding out test results, communicating by private email, and scheduling acute appointments online when needed. ° °Please make an Appointment to return for your 1 year visit, or sooner if needed °

## 2021-08-22 NOTE — Progress Notes (Signed)
Patient ID: Joseph Santana, male   DOB: [birth year], 66 y.o.   MRN: [id number] ? ? ? ?     Chief Complaint:: wellness exam and Office Visit (Sore throat and congestion after taking abx) ? , allergies ? ?     HPI:  Joseph Santana is a 66 y.o. male here for wellness exam; already up to date ?         ?              Also s/p recent URI improved with antibx course, but now Does have several wks ongoing nasal allergy symptoms with clearish congestion, itch and sneezing and mild ST, without fever, pain, cough, swelling or wheezing.  Pt denies chest pain, increased sob or doe, wheezing, orthopnea, PND, increased LE swelling, palpitations, dizziness or syncope.   Pt denies polydipsia, polyuria, or new focal neuro s/s.    Pt denies fever, wt loss, night sweats, loss of appetite, or other constitutional symptoms  Denies hyper or hypo thyroid symptoms such as voice, skin or hair change.   ?  ?Wt Readings from Last 3 Encounters:  ?08/22/21 154 lb (69.9 kg)  ?08/10/21 149 lb (67.6 kg)  ?07/27/21 155 lb 6.4 oz (70.5 kg)  ? ?BP Readings from Last 3 Encounters:  ?08/22/21 114/66  ?08/10/21 110/66  ?07/27/21 116/74  ? ?Immunization History  ?Administered Date(s) Administered  ? Influenza Split 02/15/2011, 02/12/2012  ? Influenza Whole 12/31/2007, 01/13/2009, 02/13/2010, 02/04/2014  ? Influenza, High Dose Seasonal PF 01/24/2015, 01/01/2016, 07/06/2019, 04/05/2020  ? Influenza,inj,Quad PF,6+ Mos 01/22/2013, 12/28/2015, 01/14/2017, 01/14/2018, 01/07/2019, 12/27/2020  ? PFIZER(Purple Top)SARS-COV-2 Vaccination 06/28/2019, 07/19/2019, 03/05/2020, 04/05/2020  ? [employer], [employer] 93yrs & up 01/02/2021  ? Td 01/01/2016  ? Tdap 12/28/2015  ? Zoster Recombinat (Shingrix) 11/10/2020, 01/08/2021  ?There are no preventive care reminders to display for this patient. ?  ? ?Past Medical History:  ?Diagnosis Date  ? ALLERGIC REACTION 07/22/2009  ? ALLERGIC RHINITIS 10/08/2006  ? B12 DEFICIENCY 05/14/2010  ? Carpal tunnel syndrome  12/31/2007  ? Degeneration of cervical intervertebral disc 12/31/2007  ? HYPERLIPIDEMIA 10/08/2006  ? Hypogonadism in male 06/26/2009  ? Qualifier: Diagnosis of  By: Lovell Sheehan MD, Deepa Barthel E   ? HYPOTHYROIDISM 10/08/2006  ? Irritable bowel syndrome 03/03/2008  ? LUMBAR RADICULOPATHY, RIGHT 12/31/2006  ? OSTEOARTHRITIS, LOWER LEG, LEFT 05/10/2009  ? SINUSITIS - ACUTE-NOS 07/22/2009  ? TESTICULAR HYPOFUNCTION 06/26/2009  ? ?Past Surgical History:  ?Procedure Laterality Date  ? BACK SURGERY    ? I & D EXTREMITY Right 05/24/2020  ? Procedure: IRRIGATION AND DEBRIDEMENT RIGHT THUMB;  Surgeon: Tarry Kos, MD;  Location:  SUR[facility]ERY CENTER;  Service: Orthopedics;  Laterality: Right;  ? KNEE SURGERY    ? LUMBAR LAMINECTOMY  2008  ? with revision  ? SPINAL FUSION  2011  ? spinal fusion revision  2012  ? ? reports that he has never smoked. He has never used smokeless tobacco. He reports that he does not drink alcohol and does not use drugs. ?family history includes Allergy (severe) in an other family member; Asthma in an other family member; Diabetes in an other family member; Hypothyroidism in his brother, father, mother, and sister. ?Allergies  ?Allergen Reactions  ? Iodine   ?  IVP dye ?  ? Levofloxacin   ? ?Current Outpatient Medications on File Prior to Visit  ?Medication Sig Dispense Refill  ? EPINEPHrine 0.3 mg/0.3 mL IJ SOAJ injection EpiPen 2-Pak 0.3 mg/0.3 mL injection,  auto-injector    ? levothyroxine (SYNTHROID) 150 MCG tablet TAKE 1 TABLET BY MOUTH 30 MINUTES BEFORE BREAKFAST EVERY DAY. 90 tablet 1  ? levothyroxine (SYNTHROID) 25 MCG tablet TAKE 1 TABLET BY MOUTH EVERY DAY 90 tablet 1  ? montelukast (SINGULAIR) 10 MG tablet Take 10 mg by mouth at bedtime.    ? Multiple Vitamin (MULTIVITAMIN) capsule Take 1 capsule by mouth daily.    ? Triamcinolone Acetonide (NASACORT ALLERGY 24HR NA) Place into the nose.    ? albuterol (VENTOLIN HFA) 108 (90 Base) MCG/ACT inhaler Inhale 2 puffs into the lungs every 6 (six) hours as  needed for wheezing or shortness of breath. (Patient not taking: Reported on 08/22/2021) 8 g 1  ? nitroGLYCERIN (NITRODUR - DOSED IN MG/24 HR) 0.2 mg/hr patch Place 1/4 patch to the affected area daily. (Patient not taking: Reported on 08/22/2021) 30 patch 0  ? ?No current facility-administered medications on file prior to visit.  ? ?     ROS:  All others reviewed and negative. ? ?Objective  ? ?     PE:  BP 114/66 (BP Location: Left Arm, Patient Position: Sitting, Cuff Size: Large)   Pulse 63   Temp 98.3 ?F (36.8 ?C) (Oral)   Ht 5\' 10"  (1.778 m)   Wt 154 lb (69.9 kg)   SpO2 97%   BMI 22.10 kg/m?  ? ?              Constitutional: Pt appears in NAD ?              HENT: Head: NCAT.  ?              Right Ear: External ear normal.   ?              Left Ear: External ear normal. Bilat tm's with mild erythema.  Max sinus areas non tender.  Pharynx with mild erythema, no exudate ?              Eyes: . Pupils are equal, round, and reactive to light. Conjunctivae and EOM are normal ?              Nose: without d/c or deformity ?              Neck: Neck supple. Gross normal ROM ?              Cardiovascular: Normal rate and regular rhythm.   ?              Pulmonary/Chest: Effort normal and breath sounds without rales or wheezing.  ?              Abd:  Soft, NT, ND, + BS, no organomegaly ?              Neurological: Pt is alert. At baseline orientation, motor grossly intact ?              Skin: Skin is warm. No rashes, no other new lesions, LE edema - none ?              Psychiatric: Pt behavior is normal without agitation  ? ?Micro: none ? ?Cardiac tracings I have personally interpreted today:  none ? ?Pertinent Radiological findings (summarize): none  ? ?Lab Results  ?Component Value Date  ? WBC 6.1 08/22/2021  ? HGB 15.2 08/22/2021  ? HCT 44.5 08/22/2021  ? PLT 296.0 08/22/2021  ? GLUCOSE 94 08/22/2021  ? CHOL 174  08/22/2021  ? TRIG 64.0 08/22/2021  ? HDL 45.40 08/22/2021  ? LDLDIRECT 146.2 11/19/2012  ? LDLCALC 116 (H)  08/22/2021  ? ALT 78 (H) 08/22/2021  ? AST 48 (H) 08/22/2021  ? NA 135 08/22/2021  ? K 4.4 08/22/2021  ? CL 99 08/22/2021  ? CREATININE 1.05 08/22/2021  ? BUN 22 08/22/2021  ? CO2 29 08/22/2021  ? TSH 0.70 07/19/2021  ? PSA 1.21 08/22/2021  ? ?Assessment/Plan:  ?Joseph Santana is a 66 y.o. White or Caucasian [1] male with  has a past medical history of ALLERGIC REACTION (07/22/2009), ALLERGIC RHINITIS (10/08/2006), B12 DEFICIENCY (05/14/2010), Carpal tunnel syndrome (12/31/2007), Degeneration of cervical intervertebral disc (12/31/2007), HYPERLIPIDEMIA (10/08/2006), Hypogonadism in male (06/26/2009), HYPOTHYROIDISM (10/08/2006), Irritable bowel syndrome (03/03/2008), LUMBAR RADICULOPATHY, RIGHT (12/31/2006), OSTEOARTHRITIS, LOWER LEG, LEFT (05/10/2009), SINUSITIS - ACUTE-NOS (07/22/2009), and TESTICULAR HYPOFUNCTION (06/26/2009). ? ?Encounter for well adult exam with abnormal findings ?Age and sex appropriate education and counseling updated with regular exercise and diet ?Referrals for preventative services - none needed ?Immunizations addressed - none needed ?Smoking counseling  - none needed ?Evidence for depression or other mood disorder - none significant ?Most recent labs reviewed. ?I have personally reviewed and have noted: ?1) the patient's medical and social history ?2) The patient's current medications and supplements ?3) The patient's height, weight, and BMI have been recorded in the chart ? ? ?Allergic rhinitis ?Mild to mod seasonal flare, for depomedrol im 80, prednisone taper,  to f/u any worsening symptoms or concerns ? ?Hypothyroidism ?Lab Results  ?Component Value Date  ? TSH 0.70 07/19/2021  ? ?Stable, pt to continue levothyroxine ? ? ?Upper respiratory infection ?By hx last wk now improved after antibx, pt does not appear to need further antibx at this time ? ?Hypogonadotropic hypogonadism (HCC) ?Also for testosterone level next labs ? ?Followup: Return in about 1 year (around 08/23/2022). ? ?Oliver BarreJames Aylyn Wenzler, MD  08/26/2021 7:02 AM ?[facility] ?[facility] ?Internal Medicine ?

## 2021-08-26 ENCOUNTER — Encounter: Payer: Self-pay | Admitting: Internal Medicine

## 2021-08-26 DIAGNOSIS — E230 Hypopituitarism: Secondary | ICD-10-CM | POA: Insufficient documentation

## 2021-08-26 DIAGNOSIS — J069 Acute upper respiratory infection, unspecified: Secondary | ICD-10-CM | POA: Insufficient documentation

## 2021-08-26 DIAGNOSIS — J309 Allergic rhinitis, unspecified: Secondary | ICD-10-CM | POA: Insufficient documentation

## 2021-08-26 NOTE — Assessment & Plan Note (Signed)
Also for testosterone level next labs ?

## 2021-08-26 NOTE — Assessment & Plan Note (Signed)
By hx last wk now improved after antibx, pt does not appear to need further antibx at this time ?

## 2021-08-26 NOTE — Assessment & Plan Note (Signed)
Lab Results  ?Component Value Date  ? TSH 0.70 07/19/2021  ? ?Stable, pt to continue levothyroxine ? ?

## 2021-08-26 NOTE — Assessment & Plan Note (Signed)

## 2021-08-26 NOTE — Assessment & Plan Note (Signed)
Mild to mod seasonal flare, for depomedrol im 80, prednisone taper, to f/u any worsening symptoms or concerns ?

## 2021-08-27 ENCOUNTER — Encounter: Payer: Self-pay | Admitting: Internal Medicine

## 2021-08-27 MED ORDER — AZITHROMYCIN 250 MG PO TABS: ORAL_TABLET | ORAL | 1 refills | Status: AC

## 2021-08-29 DIAGNOSIS — J3089 Other allergic rhinitis: Secondary | ICD-10-CM | POA: Diagnosis not present

## 2021-08-29 DIAGNOSIS — J301 Allergic rhinitis due to pollen: Secondary | ICD-10-CM | POA: Diagnosis not present

## 2021-08-29 DIAGNOSIS — J3081 Allergic rhinitis due to animal (cat) (dog) hair and dander: Secondary | ICD-10-CM | POA: Diagnosis not present

## 2021-09-06 DIAGNOSIS — J3081 Allergic rhinitis due to animal (cat) (dog) hair and dander: Secondary | ICD-10-CM | POA: Diagnosis not present

## 2021-09-06 DIAGNOSIS — J301 Allergic rhinitis due to pollen: Secondary | ICD-10-CM | POA: Diagnosis not present

## 2021-09-06 DIAGNOSIS — J3089 Other allergic rhinitis: Secondary | ICD-10-CM | POA: Diagnosis not present

## 2021-09-12 DIAGNOSIS — J301 Allergic rhinitis due to pollen: Secondary | ICD-10-CM | POA: Diagnosis not present

## 2021-09-12 DIAGNOSIS — J3089 Other allergic rhinitis: Secondary | ICD-10-CM | POA: Diagnosis not present

## 2021-09-20 DIAGNOSIS — J301 Allergic rhinitis due to pollen: Secondary | ICD-10-CM | POA: Diagnosis not present

## 2021-09-20 DIAGNOSIS — J3081 Allergic rhinitis due to animal (cat) (dog) hair and dander: Secondary | ICD-10-CM | POA: Diagnosis not present

## 2021-09-20 DIAGNOSIS — J3089 Other allergic rhinitis: Secondary | ICD-10-CM | POA: Diagnosis not present

## 2021-09-21 ENCOUNTER — Other Ambulatory Visit: Payer: Self-pay | Admitting: Internal Medicine

## 2021-09-21 ENCOUNTER — Other Ambulatory Visit: Payer: Self-pay | Admitting: Endocrinology

## 2021-09-21 NOTE — Telephone Encounter (Signed)
Please refill as per office routine med refill policy (all routine meds to be refilled for 3 mo or monthly (per pt preference) up to one year from last visit, then month to month grace period for 3 mo, then further med refills will have to be denied) ? ?

## 2021-09-23 ENCOUNTER — Other Ambulatory Visit: Payer: Self-pay | Admitting: Endocrinology

## 2021-09-23 DIAGNOSIS — E230 Hypopituitarism: Secondary | ICD-10-CM

## 2021-09-24 ENCOUNTER — Other Ambulatory Visit: Payer: Self-pay

## 2021-09-26 ENCOUNTER — Other Ambulatory Visit (INDEPENDENT_AMBULATORY_CARE_PROVIDER_SITE_OTHER): Payer: BC Managed Care – PPO

## 2021-09-26 ENCOUNTER — Other Ambulatory Visit: Payer: BC Managed Care – PPO

## 2021-09-26 DIAGNOSIS — J3081 Allergic rhinitis due to animal (cat) (dog) hair and dander: Secondary | ICD-10-CM | POA: Diagnosis not present

## 2021-09-26 DIAGNOSIS — J301 Allergic rhinitis due to pollen: Secondary | ICD-10-CM | POA: Diagnosis not present

## 2021-09-26 DIAGNOSIS — E230 Hypopituitarism: Secondary | ICD-10-CM

## 2021-09-26 DIAGNOSIS — J3089 Other allergic rhinitis: Secondary | ICD-10-CM | POA: Diagnosis not present

## 2021-09-26 LAB — TESTOSTERONE: Testosterone: 430.78 ng/dL (ref 300.00–890.00)

## 2021-09-28 ENCOUNTER — Other Ambulatory Visit: Payer: Self-pay | Admitting: Endocrinology

## 2021-09-28 ENCOUNTER — Encounter: Payer: Self-pay | Admitting: Endocrinology

## 2021-10-01 ENCOUNTER — Encounter: Payer: Self-pay | Admitting: Endocrinology

## 2021-10-01 DIAGNOSIS — J301 Allergic rhinitis due to pollen: Secondary | ICD-10-CM | POA: Diagnosis not present

## 2021-10-02 DIAGNOSIS — J3089 Other allergic rhinitis: Secondary | ICD-10-CM | POA: Diagnosis not present

## 2021-10-03 DIAGNOSIS — J301 Allergic rhinitis due to pollen: Secondary | ICD-10-CM | POA: Diagnosis not present

## 2021-10-03 DIAGNOSIS — J3089 Other allergic rhinitis: Secondary | ICD-10-CM | POA: Diagnosis not present

## 2021-10-03 DIAGNOSIS — J3081 Allergic rhinitis due to animal (cat) (dog) hair and dander: Secondary | ICD-10-CM | POA: Diagnosis not present

## 2021-10-07 ENCOUNTER — Other Ambulatory Visit: Payer: Self-pay | Admitting: Endocrinology

## 2021-10-07 DIAGNOSIS — E230 Hypopituitarism: Secondary | ICD-10-CM

## 2021-10-10 DIAGNOSIS — H0100A Unspecified blepharitis right eye, upper and lower eyelids: Secondary | ICD-10-CM | POA: Diagnosis not present

## 2021-10-10 DIAGNOSIS — H2513 Age-related nuclear cataract, bilateral: Secondary | ICD-10-CM | POA: Diagnosis not present

## 2021-10-10 DIAGNOSIS — H25013 Cortical age-related cataract, bilateral: Secondary | ICD-10-CM | POA: Diagnosis not present

## 2021-10-10 DIAGNOSIS — H524 Presbyopia: Secondary | ICD-10-CM | POA: Diagnosis not present

## 2021-10-17 ENCOUNTER — Other Ambulatory Visit (INDEPENDENT_AMBULATORY_CARE_PROVIDER_SITE_OTHER): Payer: BC Managed Care – PPO

## 2021-10-17 ENCOUNTER — Other Ambulatory Visit: Payer: Self-pay | Admitting: Endocrinology

## 2021-10-17 DIAGNOSIS — E230 Hypopituitarism: Secondary | ICD-10-CM

## 2021-10-17 DIAGNOSIS — J301 Allergic rhinitis due to pollen: Secondary | ICD-10-CM | POA: Diagnosis not present

## 2021-10-17 DIAGNOSIS — J3081 Allergic rhinitis due to animal (cat) (dog) hair and dander: Secondary | ICD-10-CM | POA: Diagnosis not present

## 2021-10-17 DIAGNOSIS — J3089 Other allergic rhinitis: Secondary | ICD-10-CM | POA: Diagnosis not present

## 2021-10-17 LAB — TESTOSTERONE: Testosterone: 430.3 ng/dL (ref 300.00–890.00)

## 2021-10-30 DIAGNOSIS — J301 Allergic rhinitis due to pollen: Secondary | ICD-10-CM | POA: Diagnosis not present

## 2021-10-30 DIAGNOSIS — J3081 Allergic rhinitis due to animal (cat) (dog) hair and dander: Secondary | ICD-10-CM | POA: Diagnosis not present

## 2021-10-30 DIAGNOSIS — J3089 Other allergic rhinitis: Secondary | ICD-10-CM | POA: Diagnosis not present

## 2021-10-31 ENCOUNTER — Other Ambulatory Visit: Payer: Self-pay | Admitting: Endocrinology

## 2021-10-31 MED ORDER — JATENZO 198 MG PO CAPS: ORAL_CAPSULE | ORAL | 5 refills | Status: DC

## 2021-11-08 DIAGNOSIS — J301 Allergic rhinitis due to pollen: Secondary | ICD-10-CM | POA: Diagnosis not present

## 2021-11-08 DIAGNOSIS — J3089 Other allergic rhinitis: Secondary | ICD-10-CM | POA: Diagnosis not present

## 2021-11-08 DIAGNOSIS — J3081 Allergic rhinitis due to animal (cat) (dog) hair and dander: Secondary | ICD-10-CM | POA: Diagnosis not present

## 2021-11-13 DIAGNOSIS — J3081 Allergic rhinitis due to animal (cat) (dog) hair and dander: Secondary | ICD-10-CM | POA: Diagnosis not present

## 2021-11-13 DIAGNOSIS — J3089 Other allergic rhinitis: Secondary | ICD-10-CM | POA: Diagnosis not present

## 2021-11-13 DIAGNOSIS — J301 Allergic rhinitis due to pollen: Secondary | ICD-10-CM | POA: Diagnosis not present

## 2021-11-14 DIAGNOSIS — H43812 Vitreous degeneration, left eye: Secondary | ICD-10-CM | POA: Diagnosis not present

## 2021-11-14 DIAGNOSIS — H43821 Vitreomacular adhesion, right eye: Secondary | ICD-10-CM | POA: Diagnosis not present

## 2021-11-14 DIAGNOSIS — H3562 Retinal hemorrhage, left eye: Secondary | ICD-10-CM | POA: Diagnosis not present

## 2021-11-14 DIAGNOSIS — H348122 Central retinal vein occlusion, left eye, stable: Secondary | ICD-10-CM | POA: Diagnosis not present

## 2021-11-19 ENCOUNTER — Ambulatory Visit (INDEPENDENT_AMBULATORY_CARE_PROVIDER_SITE_OTHER): Payer: BC Managed Care – PPO | Admitting: Family Medicine

## 2021-11-19 VITALS — BP 136/59 | Ht 70.0 in | Wt 150.0 lb

## 2021-11-19 DIAGNOSIS — J3089 Other allergic rhinitis: Secondary | ICD-10-CM | POA: Diagnosis not present

## 2021-11-19 DIAGNOSIS — S76312A Strain of muscle, fascia and tendon of the posterior muscle group at thigh level, left thigh, initial encounter: Secondary | ICD-10-CM

## 2021-11-19 DIAGNOSIS — J301 Allergic rhinitis due to pollen: Secondary | ICD-10-CM | POA: Diagnosis not present

## 2021-11-19 MED ORDER — NITROGLYCERIN 0.2 MG/HR TD PT24: MEDICATED_PATCH | TRANSDERMAL | 0 refills | Status: DC

## 2021-11-19 NOTE — Patient Instructions (Signed)
You have an overuse grade 1 hamstring strain. Use the nitro patches only during the day as we discussed. Do home exercises once daily - can add ankle weight if they're too easy. Icing 15 minutes at a time as needed including after exercise. You may have to take a short break from running to help this rest and focus on rehab (1 week) if you still struggle despite trying to rehab through this.  Then you would return to about 50% of your current mileage on each run. Consider shockwave therapy - would start this in about 2 weeks if you're struggling as it's 4 treatments spaced a week apart. Message me to let me know how you're doing.

## 2021-11-19 NOTE — Progress Notes (Unsigned)
PCP: Corwin Levins, MD  Subjective:   HPI: Patient is a 66 y.o. male here for left hamstring pain.  Patient was running 2 days ago when he noticed left hamstring pain that required him to stop running and walk instead. He is currently training for a marathon at the end of September. He has tried ice, heat, Theragun massage, compression, ibuprofen without relief. He was not able to run yesterday. He wants to run again tomorrow.  No swelling or bruising.  Past Medical History:  Diagnosis Date   ALLERGIC REACTION 07/22/2009   ALLERGIC RHINITIS 10/08/2006   B12 DEFICIENCY 05/14/2010   Carpal tunnel syndrome 12/31/2007   Degeneration of cervical intervertebral disc 12/31/2007   HYPERLIPIDEMIA 10/08/2006   Hypogonadism in male 06/26/2009   Qualifier: Diagnosis of  By: Lovell Sheehan MD, John E    HYPOTHYROIDISM 10/08/2006   Irritable bowel syndrome 03/03/2008   LUMBAR RADICULOPATHY, RIGHT 12/31/2006   OSTEOARTHRITIS, LOWER LEG, LEFT 05/10/2009   SINUSITIS - ACUTE-NOS 07/22/2009   TESTICULAR HYPOFUNCTION 06/26/2009    Current Outpatient Medications on File Prior to Visit  Medication Sig Dispense Refill   albuterol (VENTOLIN HFA) 108 (90 Base) MCG/ACT inhaler Inhale 2 puffs into the lungs every 6 (six) hours as needed for wheezing or shortness of breath. (Patient not taking: Reported on 08/22/2021) 8 g 1   EPINEPHrine 0.3 mg/0.3 mL IJ SOAJ injection EpiPen 2-Pak 0.3 mg/0.3 mL injection, auto-injector     levothyroxine (SYNTHROID) 150 MCG tablet TAKE 1 TABLET BY MOUTH 30 MINUTES BEFORE BREAKFAST EVERY DAY. 90 tablet 1   levothyroxine (SYNTHROID) 25 MCG tablet TAKE 1 TABLET BY MOUTH EVERY DAY 90 tablet 3   montelukast (SINGULAIR) 10 MG tablet Take 10 mg by mouth at bedtime.     Multiple Vitamin (MULTIVITAMIN) capsule Take 1 capsule by mouth daily.     nitroGLYCERIN (NITRODUR - DOSED IN MG/24 HR) 0.2 mg/hr patch Place 1/4 patch to the affected area daily. (Patient not taking: Reported on 08/22/2021) 30 patch 0    predniSONE (DELTASONE) 10 MG tablet 3 tabs by mouth per day for 3 days,2tabs per day for 3 days,1tab per day for 3 days 18 tablet 0   Testosterone Undecanoate (JATENZO) 198 MG CAPS 1 capsule twice a day with food 60 capsule 5   Triamcinolone Acetonide (NASACORT ALLERGY 24HR NA) Place into the nose.     No current facility-administered medications on file prior to visit.    Past Surgical History:  Procedure Laterality Date   BACK SURGERY     I & D EXTREMITY Right 05/24/2020   Procedure: IRRIGATION AND DEBRIDEMENT RIGHT THUMB;  Surgeon: Tarry Kos, MD;  Location: [facility];  Service: Orthopedics;  Laterality: Right;   KNEE SURGERY     LUMBAR LAMINECTOMY  2008   with revision   SPINAL FUSION  2011   spinal fusion revision  2012    Allergies  Allergen Reactions   Iodine     IVP dye    Levofloxacin     BP (!) 136/59   Ht 5\' 10"  (1.778 m)   Wt 150 lb (68 kg)   BMI 21.52 kg/m      12/22/2019   11:13 AM 01/31/2020    3:41 PM 02/14/2020    3:35 PM 03/15/2020    3:51 PM 02/28/2021    1:24 PM 04/02/2021    4:26 PM 06/25/2021    8:55 AM  [facility] Adult [facility]  Frequency of aerobic exercise (#  of days/week) 7 7 6 6 6 6 4   Average time in minutes 90 90 75 75 80 80 90  Frequency of strengthening activities (# of days/week) 3 1 0 0 1 1 2         No data to display              Objective:  Physical Exam:  Gen: NAD, comfortable in exam room Left leg: No obvious deformity, ecchymosis, erythema, or edema. 5/5 strength hip abduction, flexion, extension, knee extension and flexion. ROM full in all directions. Mild tenderness medial mid-hamstring.  No tenderness over hamstring origin or insertions. Negative hop test.    Limited MSK u/s left leg:  No visible tears in area of hamstring pain.  No edema surrounding hamstring.    Assessment & Plan:  1. Left hamstring pain likely 2/2 hamstring strain. Recommend home exercises, nitro patches during the  day, and activity modification as needed. Discussed possible shockwave therapy if not improving. Follow up as needed.   , MS4

## 2021-11-20 ENCOUNTER — Encounter: Payer: Self-pay | Admitting: Family Medicine

## 2021-11-23 ENCOUNTER — Ambulatory Visit: Payer: Self-pay | Admitting: Sports Medicine

## 2021-11-23 VITALS — Ht 70.0 in

## 2021-11-23 DIAGNOSIS — S76312D Strain of muscle, fascia and tendon of the posterior muscle group at thigh level, left thigh, subsequent encounter: Secondary | ICD-10-CM

## 2021-11-23 NOTE — Progress Notes (Signed)
Patient ID: Joseph Santana, male   DOB: 20-Dec-1955, 66 y.o.   MRN: [id number]  Joseph Santana presents today for his first soundwave treatment of a left hamstring strain.  Please see the office note by Dr. Pearletha Forge from November 19, 2021 for details regarding history and physical exam findings at that time.  At that visit, the possibility of soundwave treatment was discussed.  Joseph Santana would like to go ahead and proceed with that today.  First treatment was performed as below.  He tolerates this without difficulty.  He will return next week for his second treatment.  I did discuss the possibility of increasing the energy, frequency, or both at his next visit.  Procedure: ECSWT Indications: Left hamstring strain   Procedure Details Consent: Risks of procedure as well as the alternatives and risks of each were explained to the patient.  Written consent for procedure obtained. Time Out: Verified patient identification, verified procedure, site was marked, verified correct patient position, medications/allergies/relevent history reviewed.  The area was cleaned with alcohol swab.     The medial left hamstring was targeted for Extracorporeal shockwave therapy.    Preset: Status post muscular injury Power Level: 90 Frequency: 10 Impulse/cycles: 2000 Head size: Large   Patient tolerated procedure well without immediate complications    This note was dictated using Dragon naturally speaking software and may contain errors in syntax, spelling, or content which have not been identified prior to signing this note.

## 2021-11-27 DIAGNOSIS — J3081 Allergic rhinitis due to animal (cat) (dog) hair and dander: Secondary | ICD-10-CM | POA: Diagnosis not present

## 2021-11-27 DIAGNOSIS — J3089 Other allergic rhinitis: Secondary | ICD-10-CM | POA: Diagnosis not present

## 2021-11-27 DIAGNOSIS — J301 Allergic rhinitis due to pollen: Secondary | ICD-10-CM | POA: Diagnosis not present

## 2021-11-29 ENCOUNTER — Ambulatory Visit (INDEPENDENT_AMBULATORY_CARE_PROVIDER_SITE_OTHER): Payer: Self-pay | Admitting: Sports Medicine

## 2021-11-29 VITALS — Ht 70.0 in

## 2021-11-29 DIAGNOSIS — S76312D Strain of muscle, fascia and tendon of the posterior muscle group at thigh level, left thigh, subsequent encounter: Secondary | ICD-10-CM

## 2021-11-29 NOTE — Progress Notes (Signed)
  Joseph Santana - 66 y.o. male MRN [id number]  Date of birth: 04-25-1955    CHIEF COMPLAINT:   L hamstring strain   Joseph Santana presents today for his second shockwave treatment of a left hamstring strain.  Please see office note by Dr. Pearletha Forge from August 7 for details regarding history and physical exam findings at initial visit.  He tolerated the first treatment well and feels he is making improvements.  He is walking/running a few miles each day now and lifting weights in the gym several times per week.  He is hopeful to be in peak condition for his marathon in [place] in September.  We performed a second treatment as below.  He tolerated it without difficulty.  He will return next week for his third treatment.  We also discussed in the meantime he should work on eccentric strengthening exercises of the left hamstring and hip abductors.  Procedure: ECSWT Indications:  L hamstring strain   Procedure Details Consent: Risks of procedure as well as the alternatives and risks of each were explained to the patient.  Written consent for procedure obtained. Time Out: Verified patient identification, verified procedure, site was marked, verified correct patient position, medications/allergies/relevent history reviewed.  The area was cleaned with alcohol swab.     The L hamstring was targeted for Extracorporeal shockwave therapy.    Preset: Status post muscular injury Power Level: 110 Frequency: 12 Impulse/cycles: 2000 Head size: Large   Patient tolerated procedure well without immediate complications      Arvella Nigh, MD PGY-4, Sports Medicine Fellow [facility]  Patient seen and evaluated with the sports medicine fellow.  I agree with the above plan of care.  Second soundwave treatment of proximal left hamstring strain performed as above.  Follow-up next week for his third treatment.

## 2021-12-01 ENCOUNTER — Other Ambulatory Visit: Payer: Self-pay | Admitting: Endocrinology

## 2021-12-04 DIAGNOSIS — J3089 Other allergic rhinitis: Secondary | ICD-10-CM | POA: Diagnosis not present

## 2021-12-04 DIAGNOSIS — J3081 Allergic rhinitis due to animal (cat) (dog) hair and dander: Secondary | ICD-10-CM | POA: Diagnosis not present

## 2021-12-04 DIAGNOSIS — J301 Allergic rhinitis due to pollen: Secondary | ICD-10-CM | POA: Diagnosis not present

## 2021-12-05 ENCOUNTER — Ambulatory Visit (INDEPENDENT_AMBULATORY_CARE_PROVIDER_SITE_OTHER): Payer: Self-pay | Admitting: Family Medicine

## 2021-12-05 DIAGNOSIS — S76312D Strain of muscle, fascia and tendon of the posterior muscle group at thigh level, left thigh, subsequent encounter: Secondary | ICD-10-CM

## 2021-12-06 ENCOUNTER — Encounter: Payer: Self-pay | Admitting: Family Medicine

## 2021-12-06 NOTE — Progress Notes (Signed)
Patient returns for his third shockwave treatment for left hamstring strain. He's returned to running recently and overall doing well with low mileage (5, 6) - not running on back to back days. Doing his home exercise program as well.  Procedure: ECSWT Indications:  left hamstring strain   Procedure Details Consent: Risks of procedure as well as the alternatives and risks of each were explained to the patient.  Written consent for procedure obtained. Time Out: Verified patient identification, verified procedure, site was marked, verified correct patient position, medications/allergies/relevent history reviewed.  The area was cleaned with alcohol swab.     The left hamstring was targeted for Extracorporeal shockwave therapy.    Preset: post muscular injury Power Level: 120 Frequency: 12 Impulse/cycles: 2000 Head size: large   Patient tolerated procedure well without immediate complications.  Follow up in 1 week for fourth treatment.

## 2021-12-12 ENCOUNTER — Ambulatory Visit (INDEPENDENT_AMBULATORY_CARE_PROVIDER_SITE_OTHER): Payer: BC Managed Care – PPO | Admitting: Family Medicine

## 2021-12-12 ENCOUNTER — Ambulatory Visit (INDEPENDENT_AMBULATORY_CARE_PROVIDER_SITE_OTHER): Payer: Self-pay | Admitting: Family Medicine

## 2021-12-12 ENCOUNTER — Ambulatory Visit: Payer: Self-pay

## 2021-12-12 VITALS — BP 112/67 | Ht 70.0 in | Wt 150.0 lb

## 2021-12-12 VITALS — Ht 70.0 in | Wt 150.0 lb

## 2021-12-12 DIAGNOSIS — M79662 Pain in left lower leg: Secondary | ICD-10-CM | POA: Diagnosis not present

## 2021-12-12 DIAGNOSIS — J301 Allergic rhinitis due to pollen: Secondary | ICD-10-CM | POA: Diagnosis not present

## 2021-12-12 DIAGNOSIS — J3089 Other allergic rhinitis: Secondary | ICD-10-CM | POA: Diagnosis not present

## 2021-12-12 DIAGNOSIS — M79652 Pain in left thigh: Secondary | ICD-10-CM | POA: Diagnosis not present

## 2021-12-12 DIAGNOSIS — S76312D Strain of muscle, fascia and tendon of the posterior muscle group at thigh level, left thigh, subsequent encounter: Secondary | ICD-10-CM

## 2021-12-12 DIAGNOSIS — J3081 Allergic rhinitis due to animal (cat) (dog) hair and dander: Secondary | ICD-10-CM | POA: Diagnosis not present

## 2021-12-12 NOTE — Assessment & Plan Note (Signed)
Continues to have improvement with his pain however he is still experiencing some tightness in his hamstrings with running.  He had no tenderness to palpation today.  He underwent shockwave therapy today, detailed below.  Discussed with patient he may have some increased soreness tomorrow.  He will return to clinic in 1 week for repeat treatment.

## 2021-12-12 NOTE — Assessment & Plan Note (Signed)
Left lateral gastroc strain, there were no obvious defects visualized on ultrasound.  Patient has full strength.  Order sent for physical therapy today.  Patient has previously used nitroglycerin patches on his hamstring and was inquiring about also using that on his gastrocnemius.  He may use a quarter of a patch on his gastrocnemius, he was counseled on side effects.  He verbalized understanding.  Patient will follow-up in 1 week.

## 2021-12-12 NOTE — Progress Notes (Unsigned)
Established Patient Office Visit  Subjective   Patient ID: Joseph Santana, male    DOB: 10/06/1955  Age: 66 y.o. MRN: [id number]  Left calf pain  [name] presents today with chief complaint of calf pain that started to bother him on Saturday.  He warmed up for his usual running routine, as he is preparing for marathon and felt that his left calf was a little bit tighter than usual.  He was able to complete his 8 miles that morning however in the last mile he did have to incorporate some walking secondary to the tightness.  He has not run since Saturday but he did the stationary bike yesterday with no aggravation of his pain.  He has not tried any medications at this time.  He does have some discomfort with walking.  He denies hearing a pop, any bruising or swelling of the area.  ROS as listed above in HPI    Objective:     BP 112/67   Ht 5\' 10"  (1.778 m)   Wt 150 lb (68 kg)   BMI 21.52 kg/m   Physical Exam Vitals reviewed.  Constitutional:      General: He is not in acute distress.    Appearance: Normal appearance. He is not ill-appearing, toxic-appearing or diaphoretic.  Pulmonary:     Effort: Pulmonary effort is normal.  Neurological:     Mental Status: He is alert.   Left calf: No obvious deformity or asymmetry.  No acute ecchymosis or effusion.  Patient has some tenderness to palpation at the musculotendinous junction of his medial gastroc.  Full range of motion plantarflexion dorsiflexion supination and pronation.  Strength 5/5 plantarflexion dorsiflexion.  Negative Thompson's test.  Ultrasound of gastroc-left  Medial gastrocnemius tendon-visualized, no abnormality seen Medial gastrocnemius muscle-visualized, no abnormality seen Lateral gastrocnemius tendon-visualized, no abnormality seen Lateral gastrocnemius muscle-visualized, no abnormality seen  Summary and additional findings-muscle, musculotendinous junction and tendon was visualized under ultrasound with no  abnormalities seen, no hypoechoic fluid surrounding tendons or disruption of muscle fibers.  Low suspicion for high-grade strain or tear. Ultrasound was performed and interpreted by Dr. and Dr. Norton Blizzard. Kioni Stahl   Assessment & Plan:   Problem List Items Addressed This Visit       Other   Pain of left calf - Primary    Left lateral gastroc strain, there were no obvious defects visualized on ultrasound.  Patient has full strength.  Order sent for physical therapy today.  Patient has previously used nitroglycerin patches on his hamstring and was inquiring about also using that on his gastrocnemius.  He may use a quarter of a patch on his gastrocnemius, he was counseled on side effects.  He verbalized understanding.  Patient will follow-up in 1 week.      Relevant Orders   Lawrence Marseilles LIMITED JOINT SPACE STRUCTURES LOW LEFT   Procedure: ECSWT Indications:  left calf strain   Procedure Details Consent: Risks of procedure as well as the alternatives and risks of each were explained to the patient.  Written consent for procedure obtained. Time Out: Verified patient identification, verified procedure, site was marked, verified correct patient position, medications/allergies/relevent history reviewed.  The area was cleaned with alcohol swab.     The left medial gastroc including musculotendinous junction was targeted for Extracorporeal shockwave therapy.    Preset: post muscular injury Power Level: 90 Frequency: 10 Impulse/cycles: 2500 Head size: large   Patient tolerated procedure well without immediate complications   Return  in about 1 week (around 12/19/2021).    Claudie Leach, DO

## 2021-12-12 NOTE — Patient Instructions (Signed)
Westly Pam from [facility] will be calling you for PT

## 2021-12-12 NOTE — Progress Notes (Unsigned)
   Established Patient Office Visit  Subjective   Patient ID: Joseph Santana, male    DOB: 1955-04-21  Age: 66 y.o. MRN: [id number]  Shockwave therapy L hamstring.  He is here today requesting shockwave therapy for his hamstring strain but he first visited [place] for 11/19/21.  He is currently training for marathon next month, and having some tightness in his left hamstring while running.  He has been using nitro patches and stretching prior to running with some relief however he still experiencing some tightness.  He denies any new injuries to the area, bruising or radiation of his pain.  ROS as listed above in HPI    Objective:     Ht 5\' 10"  (1.778 m)   Wt 150 lb (68 kg)   BMI 21.52 kg/m   Physical Exam Vitals reviewed.  Constitutional:      General: He is not in acute distress.    Appearance: Normal appearance. He is normal weight. He is not ill-appearing, toxic-appearing or diaphoretic.  Pulmonary:     Effort: Pulmonary effort is normal.  Neurological:     Mental Status: He is alert.   Left hamstring: No obvious deformity or asymmetry.  No ecchymosis or edema.  No tenderness to palpation.  He does have some decreased knee extension last approximately 5 degrees.  Strength 5/5 pain-free with resisted knee extension.    Assessment & Plan:   Problem List Items Addressed This Visit       Musculoskeletal and Integument   Hamstring strain, left, subsequent encounter - Primary    Continues to have improvement with his pain however he is still experiencing some tightness in his hamstrings with running.  He had no tenderness to palpation today.  He underwent shockwave therapy today, detailed below.  Discussed with patient he may have some increased soreness tomorrow.  He will return to clinic in 1 week for repeat treatment.      Procedure: ECSWT Indications:  Left hamstring strain   Procedure Details Consent: Risks of procedure as well as the alternatives and risks of each were  explained to the patient.  Written consent for procedure obtained. Time Out: Verified patient identification, verified procedure, site was marked, verified correct patient position, medications/allergies/relevent history reviewed.  The area was cleaned with alcohol swab.     The left hamstring muscle bundle was targeted for Extracorporeal shockwave therapy.    Preset: post muscular injury Power Level: 90 Frequency: 10 Impulse/cycles: 2500 Head size: large   Patient tolerated procedure well without immediate complications     Return in about 1 week (around 12/19/2021).    02/18/2022, DO

## 2021-12-13 ENCOUNTER — Encounter: Payer: Self-pay | Admitting: Family Medicine

## 2021-12-13 DIAGNOSIS — M79652 Pain in left thigh: Secondary | ICD-10-CM | POA: Diagnosis not present

## 2021-12-18 DIAGNOSIS — M79652 Pain in left thigh: Secondary | ICD-10-CM | POA: Diagnosis not present

## 2021-12-19 ENCOUNTER — Ambulatory Visit (INDEPENDENT_AMBULATORY_CARE_PROVIDER_SITE_OTHER): Payer: Self-pay | Admitting: Family Medicine

## 2021-12-19 ENCOUNTER — Encounter: Payer: Self-pay | Admitting: Family Medicine

## 2021-12-19 VITALS — Ht 70.0 in

## 2021-12-19 DIAGNOSIS — S76312D Strain of muscle, fascia and tendon of the posterior muscle group at thigh level, left thigh, subsequent encounter: Secondary | ICD-10-CM

## 2021-12-19 DIAGNOSIS — M79662 Pain in left lower leg: Secondary | ICD-10-CM

## 2021-12-19 NOTE — Progress Notes (Signed)
PCP: Corwin Levins, MD  Subjective:   HPI: Patient is a 66 y.o. male here for shockwave treatment.  Patient returns for fifth shockwave treatment left hamstring, second left calf. He has been doing home exercises. Running on alter-G treadmill was helpful and without notable discomfort at 60, 65% body weight. He tried running outside and could only run 4.5 miles - tightness in calf was limiting. Tolerating shockwave treatments well.  Past Medical History:  Diagnosis Date   ALLERGIC REACTION 07/22/2009   ALLERGIC RHINITIS 10/08/2006   B12 DEFICIENCY 05/14/2010   Carpal tunnel syndrome 12/31/2007   Degeneration of cervical intervertebral disc 12/31/2007   HYPERLIPIDEMIA 10/08/2006   Hypogonadism in male 06/26/2009   Qualifier: Diagnosis of  By: Lovell Sheehan MD, John E    HYPOTHYROIDISM 10/08/2006   Irritable bowel syndrome 03/03/2008   LUMBAR RADICULOPATHY, RIGHT 12/31/2006   OSTEOARTHRITIS, LOWER LEG, LEFT 05/10/2009   SINUSITIS - ACUTE-NOS 07/22/2009   TESTICULAR HYPOFUNCTION 06/26/2009    Current Outpatient Medications on File Prior to Visit  Medication Sig Dispense Refill   albuterol (VENTOLIN HFA) 108 (90 Base) MCG/ACT inhaler Inhale 2 puffs into the lungs every 6 (six) hours as needed for wheezing or shortness of breath. (Patient not taking: Reported on 08/22/2021) 8 g 1   EPINEPHrine 0.3 mg/0.3 mL IJ SOAJ injection EpiPen 2-Pak 0.3 mg/0.3 mL injection, auto-injector     JATENZO 198 MG CAPS 1 CAPSULE WITH BREAKFAST AND 1 CAPSULE WITH DINNER DAILY 60 capsule 5   levothyroxine (SYNTHROID) 150 MCG tablet TAKE 1 TABLET BY MOUTH 30 MINUTES BEFORE BREAKFAST EVERY DAY. 90 tablet 1   levothyroxine (SYNTHROID) 25 MCG tablet TAKE 1 TABLET BY MOUTH EVERY DAY 90 tablet 3   montelukast (SINGULAIR) 10 MG tablet Take 10 mg by mouth at bedtime.     Multiple Vitamin (MULTIVITAMIN) capsule Take 1 capsule by mouth daily.     nitroGLYCERIN (NITRODUR - DOSED IN MG/24 HR) 0.2 mg/hr patch Place 1/4 patch to the  affected area daily. 30 patch 0   predniSONE (DELTASONE) 10 MG tablet 3 tabs by mouth per day for 3 days,2tabs per day for 3 days,1tab per day for 3 days 18 tablet 0   Triamcinolone Acetonide (NASACORT ALLERGY 24HR NA) Place into the nose.     No current facility-administered medications on file prior to visit.    Past Surgical History:  Procedure Laterality Date   BACK SURGERY     I & D EXTREMITY Right 05/24/2020   Procedure: IRRIGATION AND DEBRIDEMENT RIGHT THUMB;  Surgeon: Tarry Kos, MD;  Location: [facility];  Service: Orthopedics;  Laterality: Right;   KNEE SURGERY     LUMBAR LAMINECTOMY  2008   with revision   SPINAL FUSION  2011   spinal fusion revision  2012    Allergies  Allergen Reactions   Iodine     IVP dye    Levofloxacin     Ht 5\' 10"  (1.778 m)   BMI 21.52 kg/m      12/22/2019   11:13 AM 01/31/2020    3:41 PM 02/14/2020    3:35 PM 03/15/2020    3:51 PM 02/28/2021    1:24 PM 04/02/2021    4:26 PM 06/25/2021    8:55 AM  [facility] Adult Exercise  Frequency of aerobic exercise (# of days/week) 7 7 6 6 6 6 4   Average time in minutes 90 90 75 75 80 80 90  Frequency of strengthening activities (# of  days/week) 3 1 0 0 1 1 2         No data to display              Objective:  Physical Exam:  Gen: NAD, comfortable in exam room   Assessment & Plan:  1. Left hamstring strain - much improved.  Fifth shockwave treatment given today.  Procedure: ECSWT Indications:  left hamstring strain   Procedure Details Consent: Risks of procedure as well as the alternatives and risks of each were explained to the patient.  Written consent for procedure obtained. Time Out: Verified patient identification, verified procedure, site was marked, verified correct patient position, medications/allergies/relevent history reviewed.  The area was cleaned with alcohol swab.     The left hamstring was targeted for Extracorporeal shockwave therapy.     Preset: post muscular injury Power Level: 120 Frequency: 12 Impulse/cycles: 2000 Head size: large   Patient tolerated procedure well without immediate complications  2. Left calf strain - second shockwave treatment given today.  Procedure: ECSWT Indications:  left calf strain   Procedure Details Consent: Risks of procedure as well as the alternatives and risks of each were explained to the patient.  Written consent for procedure obtained. Time Out: Verified patient identification, verified procedure, site was marked, verified correct patient position, medications/allergies/relevent history reviewed.  The area was cleaned with alcohol swab.     The left medial gastroc was targeted for Extracorporeal shockwave therapy.    Preset: post muscular injury Power Level: 120 Frequency: 12 Impulse/cycles: 2000 Head size: large   Patient tolerated procedure well without immediate complications

## 2021-12-20 DIAGNOSIS — M79652 Pain in left thigh: Secondary | ICD-10-CM | POA: Diagnosis not present

## 2021-12-21 ENCOUNTER — Other Ambulatory Visit: Payer: Self-pay | Admitting: Family Medicine

## 2021-12-24 DIAGNOSIS — J301 Allergic rhinitis due to pollen: Secondary | ICD-10-CM | POA: Diagnosis not present

## 2021-12-24 DIAGNOSIS — J3089 Other allergic rhinitis: Secondary | ICD-10-CM | POA: Diagnosis not present

## 2021-12-25 DIAGNOSIS — M79652 Pain in left thigh: Secondary | ICD-10-CM | POA: Diagnosis not present

## 2021-12-26 ENCOUNTER — Ambulatory Visit (INDEPENDENT_AMBULATORY_CARE_PROVIDER_SITE_OTHER): Payer: Self-pay | Admitting: Family Medicine

## 2021-12-26 VITALS — Ht 70.0 in

## 2021-12-26 DIAGNOSIS — S76312D Strain of muscle, fascia and tendon of the posterior muscle group at thigh level, left thigh, subsequent encounter: Secondary | ICD-10-CM

## 2021-12-26 DIAGNOSIS — M79662 Pain in left lower leg: Secondary | ICD-10-CM

## 2021-12-27 ENCOUNTER — Encounter: Payer: Self-pay | Admitting: Family Medicine

## 2021-12-27 DIAGNOSIS — M79652 Pain in left thigh: Secondary | ICD-10-CM | POA: Diagnosis not present

## 2021-12-27 NOTE — Progress Notes (Signed)
PCP: Corwin Levins, MD  Subjective:   HPI: Patient is a 66 y.o. male here for shockwave therapy.  He's here for 6th shockwave treatment left hamstring, 3rd for left calf. Has been testing this more with alter-G and running outdoors. Used knee brace on left knee that he'd used previously for patellofemoral syndrome and pain was less in hamstring and calf when he used this.  Past Medical History:  Diagnosis Date   ALLERGIC REACTION 07/22/2009   ALLERGIC RHINITIS 10/08/2006   B12 DEFICIENCY 05/14/2010   Carpal tunnel syndrome 12/31/2007   Degeneration of cervical intervertebral disc 12/31/2007   HYPERLIPIDEMIA 10/08/2006   Hypogonadism in male 06/26/2009   Qualifier: Diagnosis of  By: Lovell Sheehan MD, John E    HYPOTHYROIDISM 10/08/2006   Irritable bowel syndrome 03/03/2008   LUMBAR RADICULOPATHY, RIGHT 12/31/2006   OSTEOARTHRITIS, LOWER LEG, LEFT 05/10/2009   SINUSITIS - ACUTE-NOS 07/22/2009   TESTICULAR HYPOFUNCTION 06/26/2009    Current Outpatient Medications on File Prior to Visit  Medication Sig Dispense Refill   albuterol (VENTOLIN HFA) 108 (90 Base) MCG/ACT inhaler Inhale 2 puffs into the lungs every 6 (six) hours as needed for wheezing or shortness of breath. (Patient not taking: Reported on 08/22/2021) 8 g 1   EPINEPHrine 0.3 mg/0.3 mL IJ SOAJ injection EpiPen 2-Pak 0.3 mg/0.3 mL injection, auto-injector     JATENZO 198 MG CAPS 1 CAPSULE WITH BREAKFAST AND 1 CAPSULE WITH DINNER DAILY 60 capsule 5   levothyroxine (SYNTHROID) 150 MCG tablet TAKE 1 TABLET BY MOUTH 30 MINUTES BEFORE BREAKFAST EVERY DAY. 90 tablet 1   levothyroxine (SYNTHROID) 25 MCG tablet TAKE 1 TABLET BY MOUTH EVERY DAY 90 tablet 3   montelukast (SINGULAIR) 10 MG tablet Take 10 mg by mouth at bedtime.     Multiple Vitamin (MULTIVITAMIN) capsule Take 1 capsule by mouth daily.     nitroGLYCERIN (NITRODUR - DOSED IN MG/24 HR) 0.2 mg/hr patch PLACE 1/4 PATCH TO THE AFFECTED AREA DAILY. 30 patch 0   predniSONE (DELTASONE) 10 MG  tablet 3 tabs by mouth per day for 3 days,2tabs per day for 3 days,1tab per day for 3 days 18 tablet 0   Triamcinolone Acetonide (NASACORT ALLERGY 24HR NA) Place into the nose.     No current facility-administered medications on file prior to visit.    Past Surgical History:  Procedure Laterality Date   BACK SURGERY     I & D EXTREMITY Right 05/24/2020   Procedure: IRRIGATION AND DEBRIDEMENT RIGHT THUMB;  Surgeon: Tarry Kos, MD;  Location: [facility];  Service: Orthopedics;  Laterality: Right;   KNEE SURGERY     LUMBAR LAMINECTOMY  2008   with revision   SPINAL FUSION  2011   spinal fusion revision  2012    Allergies  Allergen Reactions   Iodine     IVP dye    Levofloxacin     Ht 5\' 10"  (1.778 m)   BMI 21.52 kg/m      12/22/2019   11:13 AM 01/31/2020    3:41 PM 02/14/2020    3:35 PM 03/15/2020    3:51 PM 02/28/2021    1:24 PM 04/02/2021    4:26 PM 06/25/2021    8:55 AM  [facility] Adult Exercise  Frequency of aerobic exercise (# of days/week) 7 7 6 6 6 6 4   Average time in minutes 90 90 75 75 80 80 90  Frequency of strengthening activities (# of days/week) 3 1 0 0  1 1 2         No data to display              Objective:  Physical Exam:  Gen: NAD, comfortable in exam room   Assessment & Plan:  1. Left calf, hamstring strains- shockwave treatments as below.  Procedure: ECSWT Indications:  left hamstring strain, patellofemoral syndrome   Procedure Details Consent: Risks of procedure as well as the alternatives and risks of each were explained to the patient.  Written consent for procedure obtained. Time Out: Verified patient identification, verified procedure, site was marked, verified correct patient position, medications/allergies/relevent history reviewed.  The area was cleaned with alcohol swab.     The left hamstring and VMO were targeted for Extracorporeal shockwave therapy.    Preset: post muscular injury Power Level:  120 Frequency: 12 Impulse/cycles: 2000 (1000 each for hamstring and VMO) Head size: large   Patient tolerated procedure well without immediate complications   Procedure: ECSWT Indications:  left calf strain   Procedure Details Consent: Risks of procedure as well as the alternatives and risks of each were explained to the patient.  Written consent for procedure obtained. Time Out: Verified patient identification, verified procedure, site was marked, verified correct patient position, medications/allergies/relevent history reviewed.  The area was cleaned with alcohol swab.     The left medial gastroc was targeted for Extracorporeal shockwave therapy.    Preset: post muscular injury Power Level: 120 Frequency: 12 Impulse/cycles: 2000 Head size: large   Patient tolerated procedure well without immediate complications

## 2021-12-28 ENCOUNTER — Encounter: Payer: Self-pay | Admitting: Family Medicine

## 2021-12-28 DIAGNOSIS — J3081 Allergic rhinitis due to animal (cat) (dog) hair and dander: Secondary | ICD-10-CM | POA: Diagnosis not present

## 2021-12-28 DIAGNOSIS — J3089 Other allergic rhinitis: Secondary | ICD-10-CM | POA: Diagnosis not present

## 2021-12-28 DIAGNOSIS — J301 Allergic rhinitis due to pollen: Secondary | ICD-10-CM | POA: Diagnosis not present

## 2021-12-31 ENCOUNTER — Ambulatory Visit: Payer: Self-pay

## 2021-12-31 ENCOUNTER — Ambulatory Visit (INDEPENDENT_AMBULATORY_CARE_PROVIDER_SITE_OTHER): Payer: BC Managed Care – PPO | Admitting: Family Medicine

## 2021-12-31 ENCOUNTER — Encounter: Payer: Self-pay | Admitting: Family Medicine

## 2021-12-31 ENCOUNTER — Ambulatory Visit (INDEPENDENT_AMBULATORY_CARE_PROVIDER_SITE_OTHER): Payer: Self-pay | Admitting: Family Medicine

## 2021-12-31 VITALS — BP 108/66 | Ht 70.0 in

## 2021-12-31 DIAGNOSIS — S76312D Strain of muscle, fascia and tendon of the posterior muscle group at thigh level, left thigh, subsequent encounter: Secondary | ICD-10-CM

## 2021-12-31 DIAGNOSIS — M79662 Pain in left lower leg: Secondary | ICD-10-CM

## 2021-12-31 NOTE — Progress Notes (Signed)
PCP: Corwin Levins, MD  Subjective:   HPI: Patient is a 66 y.o. male here for left calf pain.  Patient has known grade 1 calf strain sustained a few weeks ago. About a week ago he went out for a run and about 1 mile into this felt like medial left calf seized up on him worse than previously. Wanted to make sure he didn't tear his calf muscle so came in today for repeat ultrasound.  Past Medical History:  Diagnosis Date   ALLERGIC REACTION 07/22/2009   ALLERGIC RHINITIS 10/08/2006   B12 DEFICIENCY 05/14/2010   Carpal tunnel syndrome 12/31/2007   Degeneration of cervical intervertebral disc 12/31/2007   HYPERLIPIDEMIA 10/08/2006   Hypogonadism in male 06/26/2009   Qualifier: Diagnosis of  By: Lovell Sheehan MD, John E    HYPOTHYROIDISM 10/08/2006   Irritable bowel syndrome 03/03/2008   LUMBAR RADICULOPATHY, RIGHT 12/31/2006   OSTEOARTHRITIS, LOWER LEG, LEFT 05/10/2009   SINUSITIS - ACUTE-NOS 07/22/2009   TESTICULAR HYPOFUNCTION 06/26/2009    Current Outpatient Medications on File Prior to Visit  Medication Sig Dispense Refill   albuterol (VENTOLIN HFA) 108 (90 Base) MCG/ACT inhaler Inhale 2 puffs into the lungs every 6 (six) hours as needed for wheezing or shortness of breath. (Patient not taking: Reported on 08/22/2021) 8 g 1   EPINEPHrine 0.3 mg/0.3 mL IJ SOAJ injection EpiPen 2-Pak 0.3 mg/0.3 mL injection, auto-injector     JATENZO 198 MG CAPS 1 CAPSULE WITH BREAKFAST AND 1 CAPSULE WITH DINNER DAILY 60 capsule 5   levothyroxine (SYNTHROID) 150 MCG tablet TAKE 1 TABLET BY MOUTH 30 MINUTES BEFORE BREAKFAST EVERY DAY. 90 tablet 1   levothyroxine (SYNTHROID) 25 MCG tablet TAKE 1 TABLET BY MOUTH EVERY DAY 90 tablet 3   montelukast (SINGULAIR) 10 MG tablet Take 10 mg by mouth at bedtime.     Multiple Vitamin (MULTIVITAMIN) capsule Take 1 capsule by mouth daily.     nitroGLYCERIN (NITRODUR - DOSED IN MG/24 HR) 0.2 mg/hr patch PLACE 1/4 PATCH TO THE AFFECTED AREA DAILY. 30 patch 0   predniSONE (DELTASONE)  10 MG tablet 3 tabs by mouth per day for 3 days,2tabs per day for 3 days,1tab per day for 3 days 18 tablet 0   Triamcinolone Acetonide (NASACORT ALLERGY 24HR NA) Place into the nose.     No current facility-administered medications on file prior to visit.    Past Surgical History:  Procedure Laterality Date   BACK SURGERY     I & D EXTREMITY Right 05/24/2020   Procedure: IRRIGATION AND DEBRIDEMENT RIGHT THUMB;  Surgeon: Tarry Kos, MD;  Location: [facility];  Service: Orthopedics;  Laterality: Right;   KNEE SURGERY     LUMBAR LAMINECTOMY  2008   with revision   SPINAL FUSION  2011   spinal fusion revision  2012    Allergies  Allergen Reactions   Iodine     IVP dye    Levofloxacin     BP 108/66   Ht 5\' 10"  (1.778 m)   BMI 21.52 kg/m      12/22/2019   11:13 AM 01/31/2020    3:41 PM 02/14/2020    3:35 PM 03/15/2020    3:51 PM 02/28/2021    1:24 PM 04/02/2021    4:26 PM 06/25/2021    8:55 AM  [facility] Adult [facility]  Frequency of aerobic exercise (# of days/week) 7 7 6 6 6 6 4   Average time in minutes 90 90 75 75  80 80 90  Frequency of strengthening activities (# of days/week) 3 1 0 0 1 1 2         No data to display              Objective:  Physical Exam:  Gen: NAD, comfortable in exam room  Left lower leg: No deformity, swelling, bruising. FROM with 5/5 strength including plantarflexion. Mild tenderness to palpation medial gastroc at musculotendinous junction. NVI distally.   Limited MSK u/s left lower leg:  No cortical irregularities.  Soleus appears normal.  No defect of achilles.  Medial gastroc in area of pain with hypoechoic change at insertion onto fascia consistent with low grade partial tear of medial gastroc. Assessment & Plan:  1. Left calf strain - low grade 2.  Reviewed home exercise program.  Continue with his physical therapy, shockwave treatments, nitro patches.

## 2021-12-31 NOTE — Progress Notes (Signed)
PCP: Biagio Borg, MD  Subjective:   HPI: Patient is a 66 y.o. male here for shockwave therapy.  Here for 7th shockwave treatment left hamstring, 4th for left calf. See recent patient message - patient had calf seize up on him recently.  Past Medical History:  Diagnosis Date   ALLERGIC REACTION 07/22/2009   ALLERGIC RHINITIS 10/08/2006   B12 DEFICIENCY 05/14/2010   Carpal tunnel syndrome 12/31/2007   Degeneration of cervical intervertebral disc 12/31/2007   HYPERLIPIDEMIA 10/08/2006   Hypogonadism in male 06/26/2009   Qualifier: Diagnosis of  By: Arnoldo Morale MD, John E    HYPOTHYROIDISM 10/08/2006   Irritable bowel syndrome 03/03/2008   LUMBAR RADICULOPATHY, RIGHT 12/31/2006   OSTEOARTHRITIS, LOWER LEG, LEFT 05/10/2009   SINUSITIS - ACUTE-NOS 07/22/2009   TESTICULAR HYPOFUNCTION 06/26/2009    Current Outpatient Medications on File Prior to Visit  Medication Sig Dispense Refill   albuterol (VENTOLIN HFA) 108 (90 Base) MCG/ACT inhaler Inhale 2 puffs into the lungs every 6 (six) hours as needed for wheezing or shortness of breath. (Patient not taking: Reported on 08/22/2021) 8 g 1   EPINEPHrine 0.3 mg/0.3 mL IJ SOAJ injection EpiPen 2-Pak 0.3 mg/0.3 mL injection, auto-injector     JATENZO 198 MG CAPS 1 CAPSULE WITH BREAKFAST AND 1 CAPSULE WITH DINNER DAILY 60 capsule 5   levothyroxine (SYNTHROID) 150 MCG tablet TAKE 1 TABLET BY MOUTH 30 MINUTES BEFORE BREAKFAST EVERY DAY. 90 tablet 1   levothyroxine (SYNTHROID) 25 MCG tablet TAKE 1 TABLET BY MOUTH EVERY DAY 90 tablet 3   montelukast (SINGULAIR) 10 MG tablet Take 10 mg by mouth at bedtime.     Multiple Vitamin (MULTIVITAMIN) capsule Take 1 capsule by mouth daily.     nitroGLYCERIN (NITRODUR - DOSED IN MG/24 HR) 0.2 mg/hr patch PLACE 1/4 PATCH TO THE AFFECTED AREA DAILY. 30 patch 0   predniSONE (DELTASONE) 10 MG tablet 3 tabs by mouth per day for 3 days,2tabs per day for 3 days,1tab per day for 3 days 18 tablet 0   Triamcinolone Acetonide (NASACORT  ALLERGY 24HR NA) Place into the nose.     No current facility-administered medications on file prior to visit.    Past Surgical History:  Procedure Laterality Date   BACK SURGERY     I & D EXTREMITY Right 05/24/2020   Procedure: IRRIGATION AND DEBRIDEMENT RIGHT THUMB;  Surgeon: Leandrew Koyanagi, MD;  Location: [place];  Service: Orthopedics;  Laterality: Right;   KNEE SURGERY     LUMBAR LAMINECTOMY  2008   with revision   SPINAL FUSION  2011   spinal fusion revision  2012    Allergies  Allergen Reactions   Iodine     IVP dye    Levofloxacin     BP 108/66   Ht 5\' 10"  (1.778 m)   BMI 21.52 kg/m      12/22/2019   11:13 AM 01/31/2020    3:41 PM 02/14/2020    3:35 PM 03/15/2020    3:51 PM 02/28/2021    1:24 PM 04/02/2021    4:26 PM 06/25/2021    8:55 AM  [facility] Exercise  Frequency of aerobic exercise (# of days/week) 7 7 6 6 6 6 4   Average time in minutes 90 90 75 75 80 80 90  Frequency of strengthening activities (# of days/week) 3 1 0 0 1 1 2         No data to display  Objective:  Physical Exam:  Gen: NAD, comfortable in exam room   Assessment & Plan:  1. Left calf, hamstring strains - shockwave treatments as below.  Procedure: ECSWT Indications:  left hamstring strain, patellofemoral syndrome   Procedure Details Consent: Risks of procedure as well as the alternatives and risks of each were explained to the patient.  Written consent for procedure obtained. Time Out: Verified patient identification, verified procedure, site was marked, verified correct patient position, medications/allergies/relevent history reviewed.  The area was cleaned with alcohol swab.     The left hamstring and VMO were targeted for Extracorporeal shockwave therapy.    Preset: post muscular injury Power Level: 120 Frequency: 12 Impulse/cycles: 2000 Head size: large   Patient tolerated procedure well without immediate  complications   Procedure: ECSWT Indications:  left calf strain   Procedure Details Consent: Risks of procedure as well as the alternatives and risks of each were explained to the patient.  Written consent for procedure obtained. Time Out: Verified patient identification, verified procedure, site was marked, verified correct patient position, medications/allergies/relevent history reviewed.  The area was cleaned with alcohol swab.     The left medial gastroc was targeted for Extracorporeal shockwave therapy.    Preset: post muscular injury Power Level: 120 Frequency: 12 Impulse/cycles: 2000 Head size: large   Patient tolerated procedure well without immediate complications

## 2022-01-01 DIAGNOSIS — J3089 Other allergic rhinitis: Secondary | ICD-10-CM | POA: Diagnosis not present

## 2022-01-01 DIAGNOSIS — J3081 Allergic rhinitis due to animal (cat) (dog) hair and dander: Secondary | ICD-10-CM | POA: Diagnosis not present

## 2022-01-01 DIAGNOSIS — J301 Allergic rhinitis due to pollen: Secondary | ICD-10-CM | POA: Diagnosis not present

## 2022-01-15 DIAGNOSIS — J3089 Other allergic rhinitis: Secondary | ICD-10-CM | POA: Diagnosis not present

## 2022-01-15 DIAGNOSIS — J301 Allergic rhinitis due to pollen: Secondary | ICD-10-CM | POA: Diagnosis not present

## 2022-01-15 DIAGNOSIS — J3081 Allergic rhinitis due to animal (cat) (dog) hair and dander: Secondary | ICD-10-CM | POA: Diagnosis not present

## 2022-01-22 DIAGNOSIS — J301 Allergic rhinitis due to pollen: Secondary | ICD-10-CM | POA: Diagnosis not present

## 2022-01-22 DIAGNOSIS — J3089 Other allergic rhinitis: Secondary | ICD-10-CM | POA: Diagnosis not present

## 2022-01-22 DIAGNOSIS — J3081 Allergic rhinitis due to animal (cat) (dog) hair and dander: Secondary | ICD-10-CM | POA: Diagnosis not present

## 2022-01-24 ENCOUNTER — Other Ambulatory Visit: Payer: Self-pay | Admitting: Family Medicine

## 2022-01-24 DIAGNOSIS — M25662 Stiffness of left knee, not elsewhere classified: Secondary | ICD-10-CM | POA: Diagnosis not present

## 2022-01-24 DIAGNOSIS — M6281 Muscle weakness (generalized): Secondary | ICD-10-CM | POA: Diagnosis not present

## 2022-01-24 DIAGNOSIS — M25652 Stiffness of left hip, not elsewhere classified: Secondary | ICD-10-CM | POA: Diagnosis not present

## 2022-01-24 DIAGNOSIS — M25562 Pain in left knee: Secondary | ICD-10-CM | POA: Diagnosis not present

## 2022-01-28 DIAGNOSIS — M25652 Stiffness of left hip, not elsewhere classified: Secondary | ICD-10-CM | POA: Diagnosis not present

## 2022-01-28 DIAGNOSIS — M25562 Pain in left knee: Secondary | ICD-10-CM | POA: Diagnosis not present

## 2022-01-28 DIAGNOSIS — M25662 Stiffness of left knee, not elsewhere classified: Secondary | ICD-10-CM | POA: Diagnosis not present

## 2022-01-28 DIAGNOSIS — M6281 Muscle weakness (generalized): Secondary | ICD-10-CM | POA: Diagnosis not present

## 2022-01-29 ENCOUNTER — Other Ambulatory Visit (INDEPENDENT_AMBULATORY_CARE_PROVIDER_SITE_OTHER): Payer: BC Managed Care – PPO

## 2022-01-29 ENCOUNTER — Other Ambulatory Visit: Payer: Self-pay | Admitting: Endocrinology

## 2022-01-29 DIAGNOSIS — E230 Hypopituitarism: Secondary | ICD-10-CM

## 2022-01-29 DIAGNOSIS — E039 Hypothyroidism, unspecified: Secondary | ICD-10-CM

## 2022-01-29 LAB — CBC
HCT: 47 % (ref 39.0–52.0)
Hemoglobin: 15.9 g/dL (ref 13.0–17.0)
MCHC: 33.8 g/dL (ref 30.0–36.0)
MCV: 88.7 fl (ref 78.0–100.0)
Platelets: 269 10*3/uL (ref 150.0–400.0)
RBC: 5.29 Mil/uL (ref 4.22–5.81)
RDW: 13.1 % (ref 11.5–15.5)
WBC: 4.8 10*3/uL (ref 4.0–10.5)

## 2022-01-29 LAB — TESTOSTERONE: Testosterone: 583.97 ng/dL (ref 300.00–890.00)

## 2022-01-29 LAB — TSH: TSH: 0.1 u[IU]/mL — ABNORMAL LOW (ref 0.35–5.50)

## 2022-01-31 DIAGNOSIS — J3081 Allergic rhinitis due to animal (cat) (dog) hair and dander: Secondary | ICD-10-CM | POA: Diagnosis not present

## 2022-01-31 DIAGNOSIS — J301 Allergic rhinitis due to pollen: Secondary | ICD-10-CM | POA: Diagnosis not present

## 2022-01-31 DIAGNOSIS — J3089 Other allergic rhinitis: Secondary | ICD-10-CM | POA: Diagnosis not present

## 2022-02-01 ENCOUNTER — Encounter: Payer: Self-pay | Admitting: Endocrinology

## 2022-02-01 ENCOUNTER — Ambulatory Visit (INDEPENDENT_AMBULATORY_CARE_PROVIDER_SITE_OTHER): Payer: BC Managed Care – PPO | Admitting: Endocrinology

## 2022-02-01 VITALS — BP 130/70 | HR 69 | Ht 70.0 in | Wt 158.0 lb

## 2022-02-01 DIAGNOSIS — E230 Hypopituitarism: Secondary | ICD-10-CM

## 2022-02-01 DIAGNOSIS — E039 Hypothyroidism, unspecified: Secondary | ICD-10-CM

## 2022-02-01 MED ORDER — JATENZO 198 MG PO CAPS: ORAL_CAPSULE | ORAL | 5 refills | Status: DC

## 2022-02-01 NOTE — Progress Notes (Signed)
Patient ID: Joseph Santana, male   DOB: [birth year], 66 y.o.   MRN: [id number]            Reason for Appointment:  Follow-up of low testosterone      History of Present Illness:   HYPOGONADISM: Prior history: He was diagnosed to have a low testosterone level in 2012 or so when he was coming to his physician with complaints of increased fatigue.  Not clear what his baseline testosterone level was but he had a level of about 130 in 2013 around the time when he was having back surgery No record of detail evaluation with pituitary function available He thinks he was tried on AndroGel initially but because of skin irritation he was changed to testosterone injections.  He may have been prescribed Axiron also but does not remember this He thinks he had significantly better with taking testosterone injections, apparently was taking about 100 mg every 2 weeks He stopped taking the injections 6-8 months prior to his initial consultation  as they were not renewed and he had a change in his PCP  RECENT history: On his initial consultation he was complaining of fatigue  Baseline testosterone was 208 with LH of 2.1 and normal prolactin, free testosterone not done by the lab  He had been on a trial of clomiphene half tablet, initially 3 times a week since 01/11/16. With this he had more energy after starting treatment With this his testosterone level came back to normal until 2/18 when it was 494, subsequently had been progressively declining and was 273 done in 1/19 This was despite taking 50 mg 5 days a week; with this he was complaining of more fatigue and sluggishness and decreased motivation   Also complaining of decreased libido  RECENT history: Previously on Fortesta gel but this was changed because of redness, irritation and sunburn-like reaction on the skin Because of the skin irritation he was changed to the Axiron preparation, but this could not keep his levels consistent  He was started on  Jatenzo in 06/2019 after approval through his insurance  He was advised to take his morning dose with breakfast and evening dose with dinner and have a small amount of fat at least with each meal  Initially started on 158 mg and subsequently taking 198 mg at times since 02/2020 but his testosterone level has been variable until recently He was feeling fatigued on the 158 mg which was given to him when he had a high testosterone level in 5/23 Now since 7/23 he has been on 198 mg again  With this his energy level has significantly improved and he was able to complete his marathon He thinks he has gained weight because of periods of inactivity  He has had occasional daytime somnolence and may wake up feeling groggy Also has a difficulty with sleep and late insomnia  His testosterone level was done about 6 hours after his morning dose and is now excellent at 584  His hemoglobin and also last PSA have been consistently normal   He has had the following testosterone levels:  Lab Results  Component Value Date   TESTOSTERONE 583.97 01/29/2022   TESTOSTERONE 430.30 10/17/2021   TESTOSTERONE 430.78 09/26/2021   TESTOSTERONE 984.13 (H) 08/22/2021    Lab Results  Component Value Date   LH 3.39 03/03/2017   LH 2.94 02/28/2016   LH 2.10 01/10/2016   Lab Results  Component Value Date   HGB 15.9 01/29/2022   Hypothyroidism was first  diagnosed  about 30 years ago  At the time of diagnosis patient was having symptoms of  fatigue but he is not sure about all the other symptoms Most probably tested also because of strong family history of thyroid disease  He takes his thyroid supplement regularly but over the last 4 to 6 weeks has started taking this at bedtime which is about 3 hours after dinner He did this because he could not be consistent with taking it an hour before breakfast in the morning  He had been on levothyroxine 150 mcg daily along with additional half of the 25 mcg dose His  dose was increased when TSH was 4.4  No symptoms of fatigue but also no symptoms of shakiness or palpitations TSH is now suppressed         Patient's weight history is as follows:  Wt Readings from Last 3 Encounters:  02/01/22 158 lb (71.7 kg)  12/12/21 150 lb (68 kg)  12/12/21 150 lb (68 kg)    Thyroid function results have been as follows:  Lab Results  Component Value Date   TSH 0.10 (L) 01/29/2022   TSH 0.70 07/19/2021   TSH 0.61 01/19/2021   FREET4 1.17 07/19/2021   FREET4 1.12 06/13/2020   FREET4 0.91 02/07/2020      Past Medical History:  Diagnosis Date   ALLERGIC REACTION 07/22/2009   ALLERGIC RHINITIS 10/08/2006   B12 DEFICIENCY 05/14/2010   Carpal tunnel syndrome 12/31/2007   Degeneration of cervical intervertebral disc 12/31/2007   HYPERLIPIDEMIA 10/08/2006   Hypogonadism in male 06/26/2009   Qualifier: Diagnosis of  By: Arnoldo Morale MD, John E    HYPOTHYROIDISM 10/08/2006   Irritable bowel syndrome 03/03/2008   LUMBAR RADICULOPATHY, RIGHT 12/31/2006   OSTEOARTHRITIS, LOWER LEG, LEFT 05/10/2009   SINUSITIS - ACUTE-NOS 07/22/2009   TESTICULAR HYPOFUNCTION 06/26/2009    Past Surgical History:  Procedure Laterality Date   BACK SURGERY     I & D EXTREMITY Right 05/24/2020   Procedure: IRRIGATION AND DEBRIDEMENT RIGHT THUMB;  Surgeon: Leandrew Koyanagi, MD;  Location: [facility];  Service: Orthopedics;  Laterality: Right;   KNEE SURGERY     LUMBAR LAMINECTOMY  2008   with revision   SPINAL FUSION  2011   spinal fusion revision  2012    Family History  Problem Relation Age of Onset   Hypothyroidism Father    Asthma Other    Diabetes Other    Allergy (severe) Other    Hypothyroidism Mother    Hypothyroidism Sister    Hypothyroidism Brother     Social History:  reports that he has never smoked. He has never used smokeless tobacco. He reports that he does not drink alcohol and does not use drugs.  Allergies:  Allergies  Allergen Reactions   Iodine      IVP dye    Levofloxacin     Allergies as of 02/01/2022       Reactions   Iodine    IVP dye   Levofloxacin         Medication List        Accurate as of February 01, 2022  8:47 AM. If you have any questions, ask your nurse or doctor.          albuterol 108 (90 Base) MCG/ACT inhaler Commonly known as: VENTOLIN HFA Inhale 2 puffs into the lungs every 6 (six) hours as needed for wheezing or shortness of breath.   EPINEPHrine 0.3 mg/0.3 mL Soaj  injection Commonly known as: EPI-PEN EpiPen 2-Pak 0.3 mg/0.3 mL injection, auto-injector   Jatenzo 198 MG Caps Generic drug: Testosterone Undecanoate 1 CAPSULE WITH BREAKFAST AND 1 CAPSULE WITH DINNER DAILY   levothyroxine 25 MCG tablet Commonly known as: SYNTHROID TAKE 1 TABLET BY MOUTH EVERY DAY   levothyroxine 150 MCG tablet Commonly known as: SYNTHROID TAKE 1 TABLET BY MOUTH 30 MINUTES BEFORE BREAKFAST EVERY DAY.   montelukast 10 MG tablet Commonly known as: SINGULAIR Take 10 mg by mouth at bedtime.   multivitamin capsule Take 1 capsule by mouth daily.   NASACORT ALLERGY 24HR NA Place into the nose.   nitroGLYCERIN 0.2 mg/hr patch Commonly known as: NITRODUR - Dosed in mg/24 hr PLACE 1/4 PATCH TO THE AFFECTED AREA DAILY.   predniSONE 10 MG tablet Commonly known as: DELTASONE 3 tabs by mouth per day for 3 days,2tabs per day for 3 days,1tab per day for 3 days        Review of Systems  He has had chronic insomnia        Examination:    BP 130/70 (Patient Position: Sitting)   Pulse 69   Ht 5\' 10"  (1.778 m)   Wt 158 lb (71.7 kg)   BMI 22.67 kg/m     Assessment:  HYPOGONADISM:   He has hypogonadotropic hypogonadism with baseline testosterone of 208 and has been symptomatic He is taking Jatenzo 198 mg twice daily with food He has subjectively done very well and is to do his running again He did not feel as good with 158 mcg previously with a testosterone level in the 400 range    HYPOTHYROIDISM  on levothyroxine 162.5 mcg daily, since TSH is 0.1 now, he will need to leave off the extra 12.5 mcg He has not changed the way he takes his Synthroid before breakfast Explained differences between T4 and TSH levels He is getting adequate dosage and explained to him the differences between thyroid levels and TSH   PLAN:  Since his hemoglobin is also stable he can continue the same dose of Jatenzo 90-day prescription given  Follow-up in 6 months Levothyroxine to be changed as above and he will have another TSH done in about 6 weeks  Elayne Snare 02/01/2022, 8:47 AM

## 2022-02-04 DIAGNOSIS — M6281 Muscle weakness (generalized): Secondary | ICD-10-CM | POA: Diagnosis not present

## 2022-02-04 DIAGNOSIS — M25562 Pain in left knee: Secondary | ICD-10-CM | POA: Diagnosis not present

## 2022-02-04 DIAGNOSIS — M25662 Stiffness of left knee, not elsewhere classified: Secondary | ICD-10-CM | POA: Diagnosis not present

## 2022-02-04 DIAGNOSIS — M25652 Stiffness of left hip, not elsewhere classified: Secondary | ICD-10-CM | POA: Diagnosis not present

## 2022-02-06 DIAGNOSIS — J3089 Other allergic rhinitis: Secondary | ICD-10-CM | POA: Diagnosis not present

## 2022-02-06 DIAGNOSIS — J301 Allergic rhinitis due to pollen: Secondary | ICD-10-CM | POA: Diagnosis not present

## 2022-02-06 DIAGNOSIS — J3081 Allergic rhinitis due to animal (cat) (dog) hair and dander: Secondary | ICD-10-CM | POA: Diagnosis not present

## 2022-02-07 ENCOUNTER — Telehealth: Payer: Self-pay | Admitting: Internal Medicine

## 2022-02-07 ENCOUNTER — Encounter: Payer: Self-pay | Admitting: Internal Medicine

## 2022-02-07 DIAGNOSIS — R9431 Abnormal electrocardiogram [ECG] [EKG]: Secondary | ICD-10-CM

## 2022-02-07 DIAGNOSIS — E7800 Pure hypercholesterolemia, unspecified: Secondary | ICD-10-CM

## 2022-02-07 NOTE — Telephone Encounter (Signed)
Patient said that at his last visit 5/190/2023 that Dr. Jenny Reichmann said that in the future he might want to have a heart scan done - Please check with Dr. Jenny Reichmann and let patient know.  He would like this ordered.

## 2022-02-12 DIAGNOSIS — J3089 Other allergic rhinitis: Secondary | ICD-10-CM | POA: Diagnosis not present

## 2022-02-12 DIAGNOSIS — M25662 Stiffness of left knee, not elsewhere classified: Secondary | ICD-10-CM | POA: Diagnosis not present

## 2022-02-12 DIAGNOSIS — J301 Allergic rhinitis due to pollen: Secondary | ICD-10-CM | POA: Diagnosis not present

## 2022-02-12 DIAGNOSIS — M6281 Muscle weakness (generalized): Secondary | ICD-10-CM | POA: Diagnosis not present

## 2022-02-12 DIAGNOSIS — J3081 Allergic rhinitis due to animal (cat) (dog) hair and dander: Secondary | ICD-10-CM | POA: Diagnosis not present

## 2022-02-12 DIAGNOSIS — M25562 Pain in left knee: Secondary | ICD-10-CM | POA: Diagnosis not present

## 2022-02-12 DIAGNOSIS — M25652 Stiffness of left hip, not elsewhere classified: Secondary | ICD-10-CM | POA: Diagnosis not present

## 2022-02-12 NOTE — Telephone Encounter (Signed)
Does this particular scan require a referral? Please advise.

## 2022-02-13 ENCOUNTER — Ambulatory Visit: Payer: Self-pay

## 2022-02-13 ENCOUNTER — Ambulatory Visit (INDEPENDENT_AMBULATORY_CARE_PROVIDER_SITE_OTHER): Payer: BC Managed Care – PPO | Admitting: Family Medicine

## 2022-02-13 VITALS — BP 110/70

## 2022-02-13 DIAGNOSIS — J301 Allergic rhinitis due to pollen: Secondary | ICD-10-CM | POA: Diagnosis not present

## 2022-02-13 DIAGNOSIS — Z91013 Allergy to seafood: Secondary | ICD-10-CM | POA: Diagnosis not present

## 2022-02-13 DIAGNOSIS — T63441A Toxic effect of venom of bees, accidental (unintentional), initial encounter: Secondary | ICD-10-CM | POA: Diagnosis not present

## 2022-02-13 DIAGNOSIS — M79662 Pain in left lower leg: Secondary | ICD-10-CM

## 2022-02-13 DIAGNOSIS — J3089 Other allergic rhinitis: Secondary | ICD-10-CM | POA: Diagnosis not present

## 2022-02-13 NOTE — Telephone Encounter (Signed)
Ok Cardiac CT score is ordered

## 2022-02-14 ENCOUNTER — Encounter: Payer: Self-pay | Admitting: Family Medicine

## 2022-02-14 NOTE — Progress Notes (Signed)
PCP: Corwin Levins, MD  Subjective:   HPI: Patient is a 66 y.o. male here for left calf pain.  9/18: Patient has known grade 1 calf strain sustained a few weeks ago. About a week ago he went out for a run and about 1 mile into this felt like medial left calf seized up on him worse than previously. Wanted to make sure he didn't tear his calf muscle so came in today for repeat ultrasound.  11/1: Patient reports he's overall doing well. He was able to actually complete the [place] marathon at a slower pace (about 1 minute/mile slower) without calf or hamstring flaring up on him. Took about 2 weeks off and has been increasing mileage. From 27 miles/week up to 31 miles per week. Then this weekend during a run it felt like medial left calf just tightened up on him. No swelling, bruising. Did not feel as bad as previous calf injury.  Past Medical History:  Diagnosis Date   ALLERGIC REACTION 07/22/2009   ALLERGIC RHINITIS 10/08/2006   B12 DEFICIENCY 05/14/2010   Carpal tunnel syndrome 12/31/2007   Degeneration of cervical intervertebral disc 12/31/2007   HYPERLIPIDEMIA 10/08/2006   Hypogonadism in male 06/26/2009   Qualifier: Diagnosis of  By: Lovell Sheehan MD, John E    HYPOTHYROIDISM 10/08/2006   Irritable bowel syndrome 03/03/2008   LUMBAR RADICULOPATHY, RIGHT 12/31/2006   OSTEOARTHRITIS, LOWER LEG, LEFT 05/10/2009   SINUSITIS - ACUTE-NOS 07/22/2009   TESTICULAR HYPOFUNCTION 06/26/2009    Current Outpatient Medications on File Prior to Visit  Medication Sig Dispense Refill   EPINEPHrine 0.3 mg/0.3 mL IJ SOAJ injection EpiPen 2-Pak 0.3 mg/0.3 mL injection, auto-injector     JATENZO 198 MG CAPS 1 CAPSULE WITH BREAKFAST AND 1 CAPSULE WITH DINNER DAILY 180 capsule 5   levothyroxine (SYNTHROID) 150 MCG tablet TAKE 1 TABLET BY MOUTH 30 MINUTES BEFORE BREAKFAST EVERY DAY. 90 tablet 1   levothyroxine (SYNTHROID) 25 MCG tablet TAKE 1 TABLET BY MOUTH EVERY DAY 90 tablet 3   montelukast (SINGULAIR) 10 MG  tablet Take 10 mg by mouth at bedtime.     Multiple Vitamin (MULTIVITAMIN) capsule Take 1 capsule by mouth daily.     nitroGLYCERIN (NITRODUR - DOSED IN MG/24 HR) 0.2 mg/hr patch PLACE 1/4 PATCH TO THE AFFECTED AREA DAILY. 30 patch 0   Triamcinolone Acetonide (NASACORT ALLERGY 24HR NA) Place into the nose.     No current facility-administered medications on file prior to visit.    Past Surgical History:  Procedure Laterality Date   BACK SURGERY     I & D EXTREMITY Right 05/24/2020   Procedure: IRRIGATION AND DEBRIDEMENT RIGHT THUMB;  Surgeon: Tarry Kos, MD;  Location: [facility];  Service: Orthopedics;  Laterality: Right;   KNEE SURGERY     LUMBAR LAMINECTOMY  2008   with revision   SPINAL FUSION  2011   spinal fusion revision  2012    Allergies  Allergen Reactions   Iodine     IVP dye    Levofloxacin     BP 110/70      12/22/2019   11:13 AM 01/31/2020    3:41 PM 02/14/2020    3:35 PM 03/15/2020    3:51 PM 02/28/2021    1:24 PM 04/02/2021    4:26 PM 06/25/2021    8:55 AM  [facility]  Frequency of aerobic exercise (# of days/week) 7 7 6 6 6 6 4   Average time in minutes  90 90 75 75 80 80 90  Frequency of strengthening activities (# of days/week) 3 1 0 0 1 1 2         No data to display              Objective:  Physical Exam:  Gen: NAD, comfortable in exam room  Left lower leg: No deformity, swelling, bruising. FROM with 5/5 strength calf raise, plantarflexion. Tenderness to palpation through medial gastroc. NVI distally. Negative thompsons.  Limited MSK u/s left lower leg:  No medial gastroc tear visualized within muscle or at insertion onto fascia.   Assessment & Plan:  1. Left calf pain - 2/2 spasm.  Reassured patient.  Reviewed increased risk of calf injury though out to 6 months with prior injury.  Home exercises.  He is seeing PT and doing dry needling - will continue with this.  F/u prn.

## 2022-02-15 ENCOUNTER — Ambulatory Visit (HOSPITAL_BASED_OUTPATIENT_CLINIC_OR_DEPARTMENT_OTHER)
Admission: RE | Admit: 2022-02-15 | Discharge: 2022-02-15 | Disposition: A | Discharge status: Home / Self Care | Payer: BC Managed Care – PPO | Source: Ambulatory Visit | Attending: Internal Medicine | Admitting: Internal Medicine

## 2022-02-15 DIAGNOSIS — E7800 Pure hypercholesterolemia, unspecified: Secondary | ICD-10-CM | POA: Insufficient documentation

## 2022-02-15 DIAGNOSIS — R9431 Abnormal electrocardiogram [ECG] [EKG]: Secondary | ICD-10-CM | POA: Insufficient documentation

## 2022-02-18 DIAGNOSIS — M25662 Stiffness of left knee, not elsewhere classified: Secondary | ICD-10-CM | POA: Diagnosis not present

## 2022-02-18 DIAGNOSIS — M6281 Muscle weakness (generalized): Secondary | ICD-10-CM | POA: Diagnosis not present

## 2022-02-18 DIAGNOSIS — M25562 Pain in left knee: Secondary | ICD-10-CM | POA: Diagnosis not present

## 2022-02-18 DIAGNOSIS — M25652 Stiffness of left hip, not elsewhere classified: Secondary | ICD-10-CM | POA: Diagnosis not present

## 2022-02-25 DIAGNOSIS — J3089 Other allergic rhinitis: Secondary | ICD-10-CM | POA: Diagnosis not present

## 2022-02-25 DIAGNOSIS — J301 Allergic rhinitis due to pollen: Secondary | ICD-10-CM | POA: Diagnosis not present

## 2022-02-25 DIAGNOSIS — M25562 Pain in left knee: Secondary | ICD-10-CM | POA: Diagnosis not present

## 2022-02-25 DIAGNOSIS — M25652 Stiffness of left hip, not elsewhere classified: Secondary | ICD-10-CM | POA: Diagnosis not present

## 2022-02-25 DIAGNOSIS — M25662 Stiffness of left knee, not elsewhere classified: Secondary | ICD-10-CM | POA: Diagnosis not present

## 2022-02-25 DIAGNOSIS — M6281 Muscle weakness (generalized): Secondary | ICD-10-CM | POA: Diagnosis not present

## 2022-02-25 DIAGNOSIS — J3081 Allergic rhinitis due to animal (cat) (dog) hair and dander: Secondary | ICD-10-CM | POA: Diagnosis not present

## 2022-03-04 DIAGNOSIS — M6281 Muscle weakness (generalized): Secondary | ICD-10-CM | POA: Diagnosis not present

## 2022-03-04 DIAGNOSIS — M25662 Stiffness of left knee, not elsewhere classified: Secondary | ICD-10-CM | POA: Diagnosis not present

## 2022-03-04 DIAGNOSIS — M25562 Pain in left knee: Secondary | ICD-10-CM | POA: Diagnosis not present

## 2022-03-04 DIAGNOSIS — M25652 Stiffness of left hip, not elsewhere classified: Secondary | ICD-10-CM | POA: Diagnosis not present

## 2022-03-11 DIAGNOSIS — M6281 Muscle weakness (generalized): Secondary | ICD-10-CM | POA: Diagnosis not present

## 2022-03-11 DIAGNOSIS — M25652 Stiffness of left hip, not elsewhere classified: Secondary | ICD-10-CM | POA: Diagnosis not present

## 2022-03-11 DIAGNOSIS — M25562 Pain in left knee: Secondary | ICD-10-CM | POA: Diagnosis not present

## 2022-03-11 DIAGNOSIS — M25662 Stiffness of left knee, not elsewhere classified: Secondary | ICD-10-CM | POA: Diagnosis not present

## 2022-03-12 DIAGNOSIS — J3081 Allergic rhinitis due to animal (cat) (dog) hair and dander: Secondary | ICD-10-CM | POA: Diagnosis not present

## 2022-03-12 DIAGNOSIS — J3089 Other allergic rhinitis: Secondary | ICD-10-CM | POA: Diagnosis not present

## 2022-03-12 DIAGNOSIS — J301 Allergic rhinitis due to pollen: Secondary | ICD-10-CM | POA: Diagnosis not present

## 2022-03-22 DIAGNOSIS — J3081 Allergic rhinitis due to animal (cat) (dog) hair and dander: Secondary | ICD-10-CM | POA: Diagnosis not present

## 2022-03-22 DIAGNOSIS — J3089 Other allergic rhinitis: Secondary | ICD-10-CM | POA: Diagnosis not present

## 2022-03-22 DIAGNOSIS — J301 Allergic rhinitis due to pollen: Secondary | ICD-10-CM | POA: Diagnosis not present

## 2022-03-28 DIAGNOSIS — J3081 Allergic rhinitis due to animal (cat) (dog) hair and dander: Secondary | ICD-10-CM | POA: Diagnosis not present

## 2022-03-28 DIAGNOSIS — J3089 Other allergic rhinitis: Secondary | ICD-10-CM | POA: Diagnosis not present

## 2022-03-28 DIAGNOSIS — J301 Allergic rhinitis due to pollen: Secondary | ICD-10-CM | POA: Diagnosis not present

## 2022-04-02 DIAGNOSIS — J3089 Other allergic rhinitis: Secondary | ICD-10-CM | POA: Diagnosis not present

## 2022-04-02 DIAGNOSIS — J301 Allergic rhinitis due to pollen: Secondary | ICD-10-CM | POA: Diagnosis not present

## 2022-04-02 DIAGNOSIS — J3081 Allergic rhinitis due to animal (cat) (dog) hair and dander: Secondary | ICD-10-CM | POA: Diagnosis not present

## 2022-04-11 ENCOUNTER — Other Ambulatory Visit: Payer: Self-pay | Admitting: Endocrinology

## 2022-04-12 DIAGNOSIS — J3089 Other allergic rhinitis: Secondary | ICD-10-CM | POA: Diagnosis not present

## 2022-04-12 DIAGNOSIS — J3081 Allergic rhinitis due to animal (cat) (dog) hair and dander: Secondary | ICD-10-CM | POA: Diagnosis not present

## 2022-04-12 DIAGNOSIS — J301 Allergic rhinitis due to pollen: Secondary | ICD-10-CM | POA: Diagnosis not present

## 2022-04-19 DIAGNOSIS — J3089 Other allergic rhinitis: Secondary | ICD-10-CM | POA: Diagnosis not present

## 2022-04-19 DIAGNOSIS — J3081 Allergic rhinitis due to animal (cat) (dog) hair and dander: Secondary | ICD-10-CM | POA: Diagnosis not present

## 2022-04-19 DIAGNOSIS — J301 Allergic rhinitis due to pollen: Secondary | ICD-10-CM | POA: Diagnosis not present

## 2022-04-20 ENCOUNTER — Telehealth: Payer: BC Managed Care – PPO | Admitting: Nurse Practitioner

## 2022-04-20 DIAGNOSIS — J329 Chronic sinusitis, unspecified: Secondary | ICD-10-CM | POA: Diagnosis not present

## 2022-04-20 DIAGNOSIS — B9689 Other specified bacterial agents as the cause of diseases classified elsewhere: Secondary | ICD-10-CM | POA: Diagnosis not present

## 2022-04-20 MED ORDER — AMOXICILLIN-POT CLAVULANATE 875-125 MG PO TABS: 1.0000 | ORAL_TABLET | Freq: Two times a day (BID) | ORAL | 0 refills | Status: AC

## 2022-04-20 NOTE — Progress Notes (Signed)

## 2022-04-20 NOTE — Progress Notes (Signed)
I have spent 5 minutes in review of e-visit questionnaire, review and updating patient chart, medical decision making and response to patient.  ° °Joseph Villarin W Debraann Livingstone, NP ° °  °

## 2022-04-23 ENCOUNTER — Telehealth: Payer: Self-pay | Admitting: Internal Medicine

## 2022-04-23 NOTE — Telephone Encounter (Signed)
Pt had an E-visit on 1.6.23 and was given an antibiotic amoxicillin-clavulanate. Pt is not feeling better and would like an additional rx for prednisone.  Please send RX to  [id number]   Phone: [phone]  Fax: [phone]

## 2022-04-24 MED ORDER — PREDNISONE 10 MG PO TABS: ORAL_TABLET | ORAL | 0 refills | Status: DC

## 2022-04-24 NOTE — Telephone Encounter (Signed)
Patient [facility] and was prescribed an abx. He is not getting better and is requesting prednisone, please advise if poosible

## 2022-04-24 NOTE — Telephone Encounter (Signed)
Ok done erx 

## 2022-05-01 DIAGNOSIS — J3081 Allergic rhinitis due to animal (cat) (dog) hair and dander: Secondary | ICD-10-CM | POA: Diagnosis not present

## 2022-05-01 DIAGNOSIS — J3089 Other allergic rhinitis: Secondary | ICD-10-CM | POA: Diagnosis not present

## 2022-05-01 DIAGNOSIS — J301 Allergic rhinitis due to pollen: Secondary | ICD-10-CM | POA: Diagnosis not present

## 2022-05-03 DIAGNOSIS — H0100B Unspecified blepharitis left eye, upper and lower eyelids: Secondary | ICD-10-CM | POA: Diagnosis not present

## 2022-05-08 DIAGNOSIS — J3089 Other allergic rhinitis: Secondary | ICD-10-CM | POA: Diagnosis not present

## 2022-05-08 DIAGNOSIS — J301 Allergic rhinitis due to pollen: Secondary | ICD-10-CM | POA: Diagnosis not present

## 2022-05-09 DIAGNOSIS — J3089 Other allergic rhinitis: Secondary | ICD-10-CM | POA: Diagnosis not present

## 2022-05-10 DIAGNOSIS — J3081 Allergic rhinitis due to animal (cat) (dog) hair and dander: Secondary | ICD-10-CM | POA: Diagnosis not present

## 2022-05-10 DIAGNOSIS — J301 Allergic rhinitis due to pollen: Secondary | ICD-10-CM | POA: Diagnosis not present

## 2022-05-10 DIAGNOSIS — J3089 Other allergic rhinitis: Secondary | ICD-10-CM | POA: Diagnosis not present

## 2022-05-15 DIAGNOSIS — J3081 Allergic rhinitis due to animal (cat) (dog) hair and dander: Secondary | ICD-10-CM | POA: Diagnosis not present

## 2022-05-15 DIAGNOSIS — J3089 Other allergic rhinitis: Secondary | ICD-10-CM | POA: Diagnosis not present

## 2022-05-15 DIAGNOSIS — J301 Allergic rhinitis due to pollen: Secondary | ICD-10-CM | POA: Diagnosis not present

## 2022-05-22 DIAGNOSIS — H3562 Retinal hemorrhage, left eye: Secondary | ICD-10-CM | POA: Diagnosis not present

## 2022-05-22 DIAGNOSIS — H43821 Vitreomacular adhesion, right eye: Secondary | ICD-10-CM | POA: Diagnosis not present

## 2022-05-22 DIAGNOSIS — H43812 Vitreous degeneration, left eye: Secondary | ICD-10-CM | POA: Diagnosis not present

## 2022-05-22 DIAGNOSIS — H348122 Central retinal vein occlusion, left eye, stable: Secondary | ICD-10-CM | POA: Diagnosis not present

## 2022-05-23 DIAGNOSIS — J3081 Allergic rhinitis due to animal (cat) (dog) hair and dander: Secondary | ICD-10-CM | POA: Diagnosis not present

## 2022-05-23 DIAGNOSIS — J3089 Other allergic rhinitis: Secondary | ICD-10-CM | POA: Diagnosis not present

## 2022-05-23 DIAGNOSIS — J301 Allergic rhinitis due to pollen: Secondary | ICD-10-CM | POA: Diagnosis not present

## 2022-06-04 DIAGNOSIS — J3081 Allergic rhinitis due to animal (cat) (dog) hair and dander: Secondary | ICD-10-CM | POA: Diagnosis not present

## 2022-06-04 DIAGNOSIS — J3089 Other allergic rhinitis: Secondary | ICD-10-CM | POA: Diagnosis not present

## 2022-06-04 DIAGNOSIS — J301 Allergic rhinitis due to pollen: Secondary | ICD-10-CM | POA: Diagnosis not present

## 2022-06-10 DIAGNOSIS — J3081 Allergic rhinitis due to animal (cat) (dog) hair and dander: Secondary | ICD-10-CM | POA: Diagnosis not present

## 2022-06-10 DIAGNOSIS — J301 Allergic rhinitis due to pollen: Secondary | ICD-10-CM | POA: Diagnosis not present

## 2022-06-10 DIAGNOSIS — J3089 Other allergic rhinitis: Secondary | ICD-10-CM | POA: Diagnosis not present

## 2022-06-20 DIAGNOSIS — J029 Acute pharyngitis, unspecified: Secondary | ICD-10-CM | POA: Diagnosis not present

## 2022-06-20 DIAGNOSIS — R051 Acute cough: Secondary | ICD-10-CM | POA: Diagnosis not present

## 2022-06-20 DIAGNOSIS — U071 COVID-19: Secondary | ICD-10-CM | POA: Diagnosis not present

## 2022-06-20 DIAGNOSIS — R030 Elevated blood-pressure reading, without diagnosis of hypertension: Secondary | ICD-10-CM | POA: Diagnosis not present

## 2022-06-26 ENCOUNTER — Telehealth (INDEPENDENT_AMBULATORY_CARE_PROVIDER_SITE_OTHER): Payer: BC Managed Care – PPO | Admitting: Internal Medicine

## 2022-06-26 DIAGNOSIS — U071 COVID-19: Secondary | ICD-10-CM

## 2022-06-26 MED ORDER — HYDROCODONE BIT-HOMATROP MBR 5-1.5 MG/5ML PO SOLN: 5.0000 mL | Freq: Four times a day (QID) | ORAL | 0 refills | Status: AC | PRN

## 2022-06-26 MED ORDER — PREDNISONE 10 MG PO TABS: ORAL_TABLET | ORAL | 0 refills | Status: DC

## 2022-06-26 MED ORDER — MOLNUPIRAVIR EUA 200MG CAPSULE: 4.0000 | ORAL_CAPSULE | Freq: Two times a day (BID) | ORAL | 0 refills | Status: AC

## 2022-06-26 MED ORDER — ALBUTEROL SULFATE HFA 108 (90 BASE) MCG/ACT IN AERS: 2.0000 | INHALATION_SPRAY | Freq: Four times a day (QID) | RESPIRATORY_TRACT | 0 refills | Status: DC | PRN

## 2022-06-26 NOTE — Progress Notes (Signed)
Patient ID: Joseph Santana, male   DOB: March 13, 1956, 67 y.o.   MRN: [id number]  Virtual Visit via Video Note  I connected with Joseph Santana on 06/26/22 at  1:20 PM EDT by a video enabled telemedicine application and verified that I am speaking with the correct person using two identifiers.  Location of all participants today Patient: at home Provider: at office   I discussed the limitations of evaluation and management by telemedicine and the availability of in person appointments. The patient expressed understanding and agreed to proceed.  History of Present Illness: Here to f/u after returning from plane trip to [place], now with 1-2 days onset fever, cough, congestion, HA, intact taste and smell, non prod cough, but nos n/v/d, wheezing or sob.  Pt denies chest pain, increased sob or doe, wheezing, orthopnea, PND, increased LE swelling, palpitations, dizziness or syncope.   Pt denies polydipsia, polyuria, or new focal neuro s/s.    Past Medical History:  Diagnosis Date   ALLERGIC REACTION 07/22/2009   ALLERGIC RHINITIS 10/08/2006   B12 DEFICIENCY 05/14/2010   Carpal tunnel syndrome 12/31/2007   Degeneration of cervical intervertebral disc 12/31/2007   HYPERLIPIDEMIA 10/08/2006   Hypogonadism in male 06/26/2009   Qualifier: Diagnosis of  By: Arnoldo Morale MD, Kimala Horne E    HYPOTHYROIDISM 10/08/2006   Irritable bowel syndrome 03/03/2008   LUMBAR RADICULOPATHY, RIGHT 12/31/2006   OSTEOARTHRITIS, LOWER LEG, LEFT 05/10/2009   SINUSITIS - ACUTE-NOS 07/22/2009   TESTICULAR HYPOFUNCTION 06/26/2009   Past Surgical History:  Procedure Laterality Date   BACK SURGERY     I & D EXTREMITY Right 05/24/2020   Procedure: IRRIGATION AND DEBRIDEMENT RIGHT THUMB;  Surgeon: Leandrew Koyanagi, MD;  Location: [facility];  Service: Orthopedics;  Laterality: Right;   KNEE SURGERY     LUMBAR LAMINECTOMY  2008   with revision   SPINAL FUSION  2011   spinal fusion revision  2012    reports that he has never smoked.  He has never used smokeless tobacco. He reports that he does not drink alcohol and does not use drugs. family history includes Allergy (severe) in an other family member; Asthma in an other family member; Diabetes in an other family member; Hypothyroidism in his brother, father, mother, and sister. Allergies  Allergen Reactions   Iodine     IVP dye    Levofloxacin    Current Outpatient Medications on File Prior to Visit  Medication Sig Dispense Refill   EPINEPHrine 0.3 mg/0.3 mL IJ SOAJ injection EpiPen 2-Pak 0.3 mg/0.3 mL injection, auto-injector     JATENZO 198 MG CAPS 1 CAPSULE WITH BREAKFAST AND 1 CAPSULE WITH DINNER DAILY 180 capsule 5   levothyroxine (SYNTHROID) 150 MCG tablet TAKE 1 TABLET BY MOUTH 30 MINUTES BEFORE BREAKFAST EVERY DAY. 90 tablet 1   montelukast (SINGULAIR) 10 MG tablet Take 10 mg by mouth at bedtime.     Multiple Vitamin (MULTIVITAMIN) capsule Take 1 capsule by mouth daily.     Triamcinolone Acetonide (NASACORT ALLERGY 24HR NA) Place into the nose.     No current facility-administered medications on file prior to visit.    Observations/Objective: Alert, NAD, appropriate mood and affect, resps normal, cn 2-12 intact, moves all 4s, no visible rash or swelling Lab Results  Component Value Date   WBC 4.8 01/29/2022   HGB 15.9 01/29/2022   HCT 47.0 01/29/2022   PLT 269.0 01/29/2022   GLUCOSE 94 08/22/2021   CHOL 174 08/22/2021   TRIG  64.0 08/22/2021   HDL 45.40 08/22/2021   LDLDIRECT 146.2 11/19/2012   LDLCALC 116 (H) 08/22/2021   ALT 78 (H) 08/22/2021   AST 48 (H) 08/22/2021   NA 135 08/22/2021   K 4.4 08/22/2021   CL 99 08/22/2021   CREATININE 1.05 08/22/2021   BUN 22 08/22/2021   CO2 29 08/22/2021   TSH 0.10 (L) 01/29/2022   PSA 1.21 08/22/2021   Assessment and Plan: See notes  Follow Up Instructions: See notes   I discussed the assessment and treatment plan with the patient. The patient was provided an opportunity to ask questions and all  were answered. The patient agreed with the plan and demonstrated an understanding of the instructions.   The patient was advised to call back or seek an in-person evaluation if the symptoms worsen or if the condition fails to improve as anticipated.   Cathlean Cower, MD

## 2022-06-29 ENCOUNTER — Encounter: Payer: Self-pay | Admitting: Internal Medicine

## 2022-06-29 NOTE — Patient Instructions (Signed)
Please take all new medication as prescribed 

## 2022-06-29 NOTE — Assessment & Plan Note (Signed)
Mild to mod, for antibx course - pt reqeusts molnupiravir course, also prednisone taper, cough med prn, and inhaler prn,  to f/u any worsening symptoms or concerns

## 2022-06-29 NOTE — Addendum Note (Signed)
Addended by: Biagio Borg on: 06/29/2022 05:13 PM   Modules accepted: Level of Service

## 2022-07-03 DIAGNOSIS — J3089 Other allergic rhinitis: Secondary | ICD-10-CM | POA: Diagnosis not present

## 2022-07-03 DIAGNOSIS — J301 Allergic rhinitis due to pollen: Secondary | ICD-10-CM | POA: Diagnosis not present

## 2022-07-03 DIAGNOSIS — J3081 Allergic rhinitis due to animal (cat) (dog) hair and dander: Secondary | ICD-10-CM | POA: Diagnosis not present

## 2022-07-09 DIAGNOSIS — J301 Allergic rhinitis due to pollen: Secondary | ICD-10-CM | POA: Diagnosis not present

## 2022-07-09 DIAGNOSIS — J3089 Other allergic rhinitis: Secondary | ICD-10-CM | POA: Diagnosis not present

## 2022-07-09 DIAGNOSIS — J3081 Allergic rhinitis due to animal (cat) (dog) hair and dander: Secondary | ICD-10-CM | POA: Diagnosis not present

## 2022-07-16 DIAGNOSIS — J301 Allergic rhinitis due to pollen: Secondary | ICD-10-CM | POA: Diagnosis not present

## 2022-07-16 DIAGNOSIS — J3089 Other allergic rhinitis: Secondary | ICD-10-CM | POA: Diagnosis not present

## 2022-07-16 DIAGNOSIS — J3081 Allergic rhinitis due to animal (cat) (dog) hair and dander: Secondary | ICD-10-CM | POA: Diagnosis not present

## 2022-07-22 DIAGNOSIS — J301 Allergic rhinitis due to pollen: Secondary | ICD-10-CM | POA: Diagnosis not present

## 2022-07-22 DIAGNOSIS — J3081 Allergic rhinitis due to animal (cat) (dog) hair and dander: Secondary | ICD-10-CM | POA: Diagnosis not present

## 2022-07-22 DIAGNOSIS — J3089 Other allergic rhinitis: Secondary | ICD-10-CM | POA: Diagnosis not present

## 2022-07-29 IMAGING — CR DG HIP (WITH OR WITHOUT PELVIS) 2-3V*L*
2 series · 2 of 2 positions shown · non-contrast
Comparison: None.

CLINICAL DATA: Left hip pain for several months, no known injury,
initial encounter

EXAM:
DG HIP (WITH OR WITHOUT PELVIS) 2V LEFT

[t hip ap left]
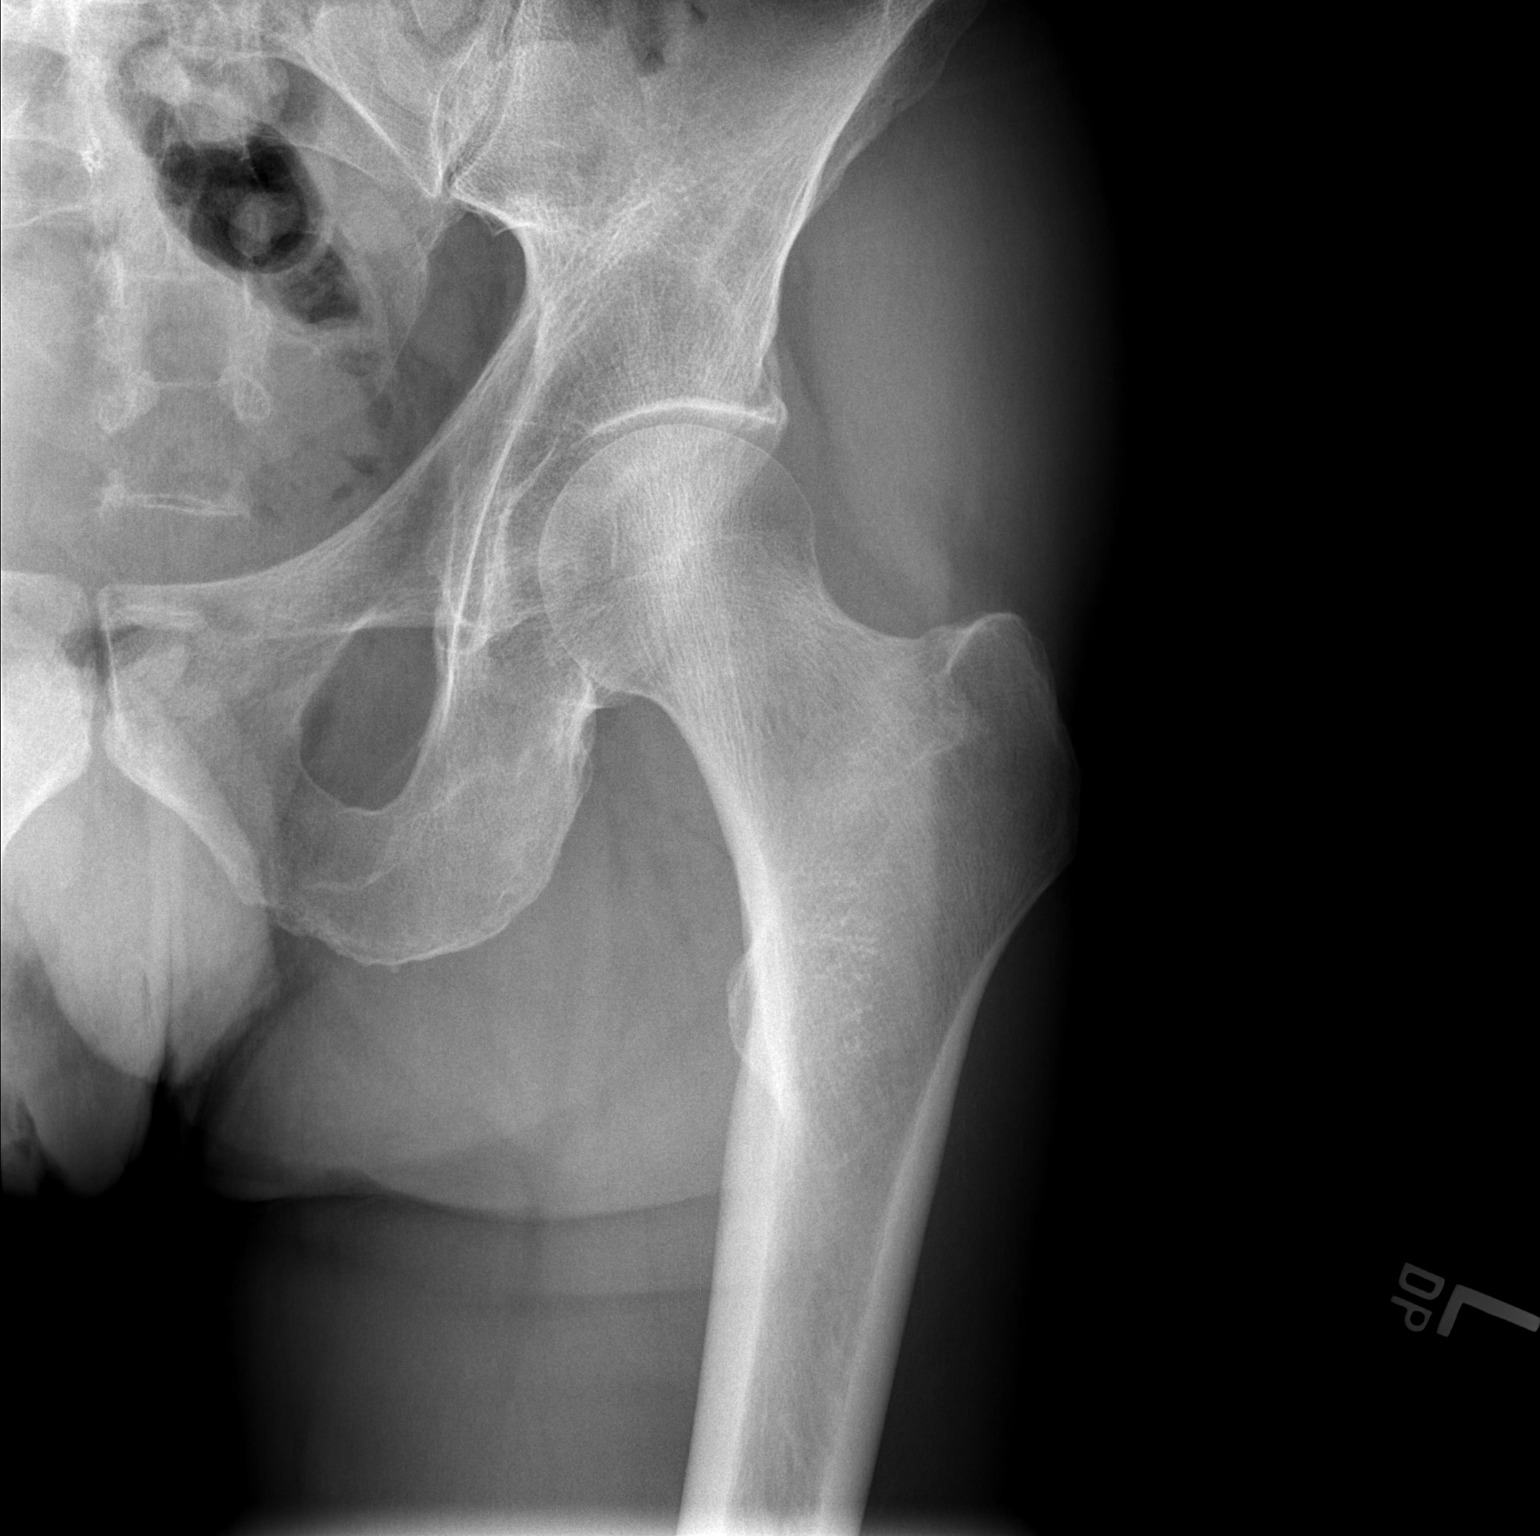

[t hip frog leg left]
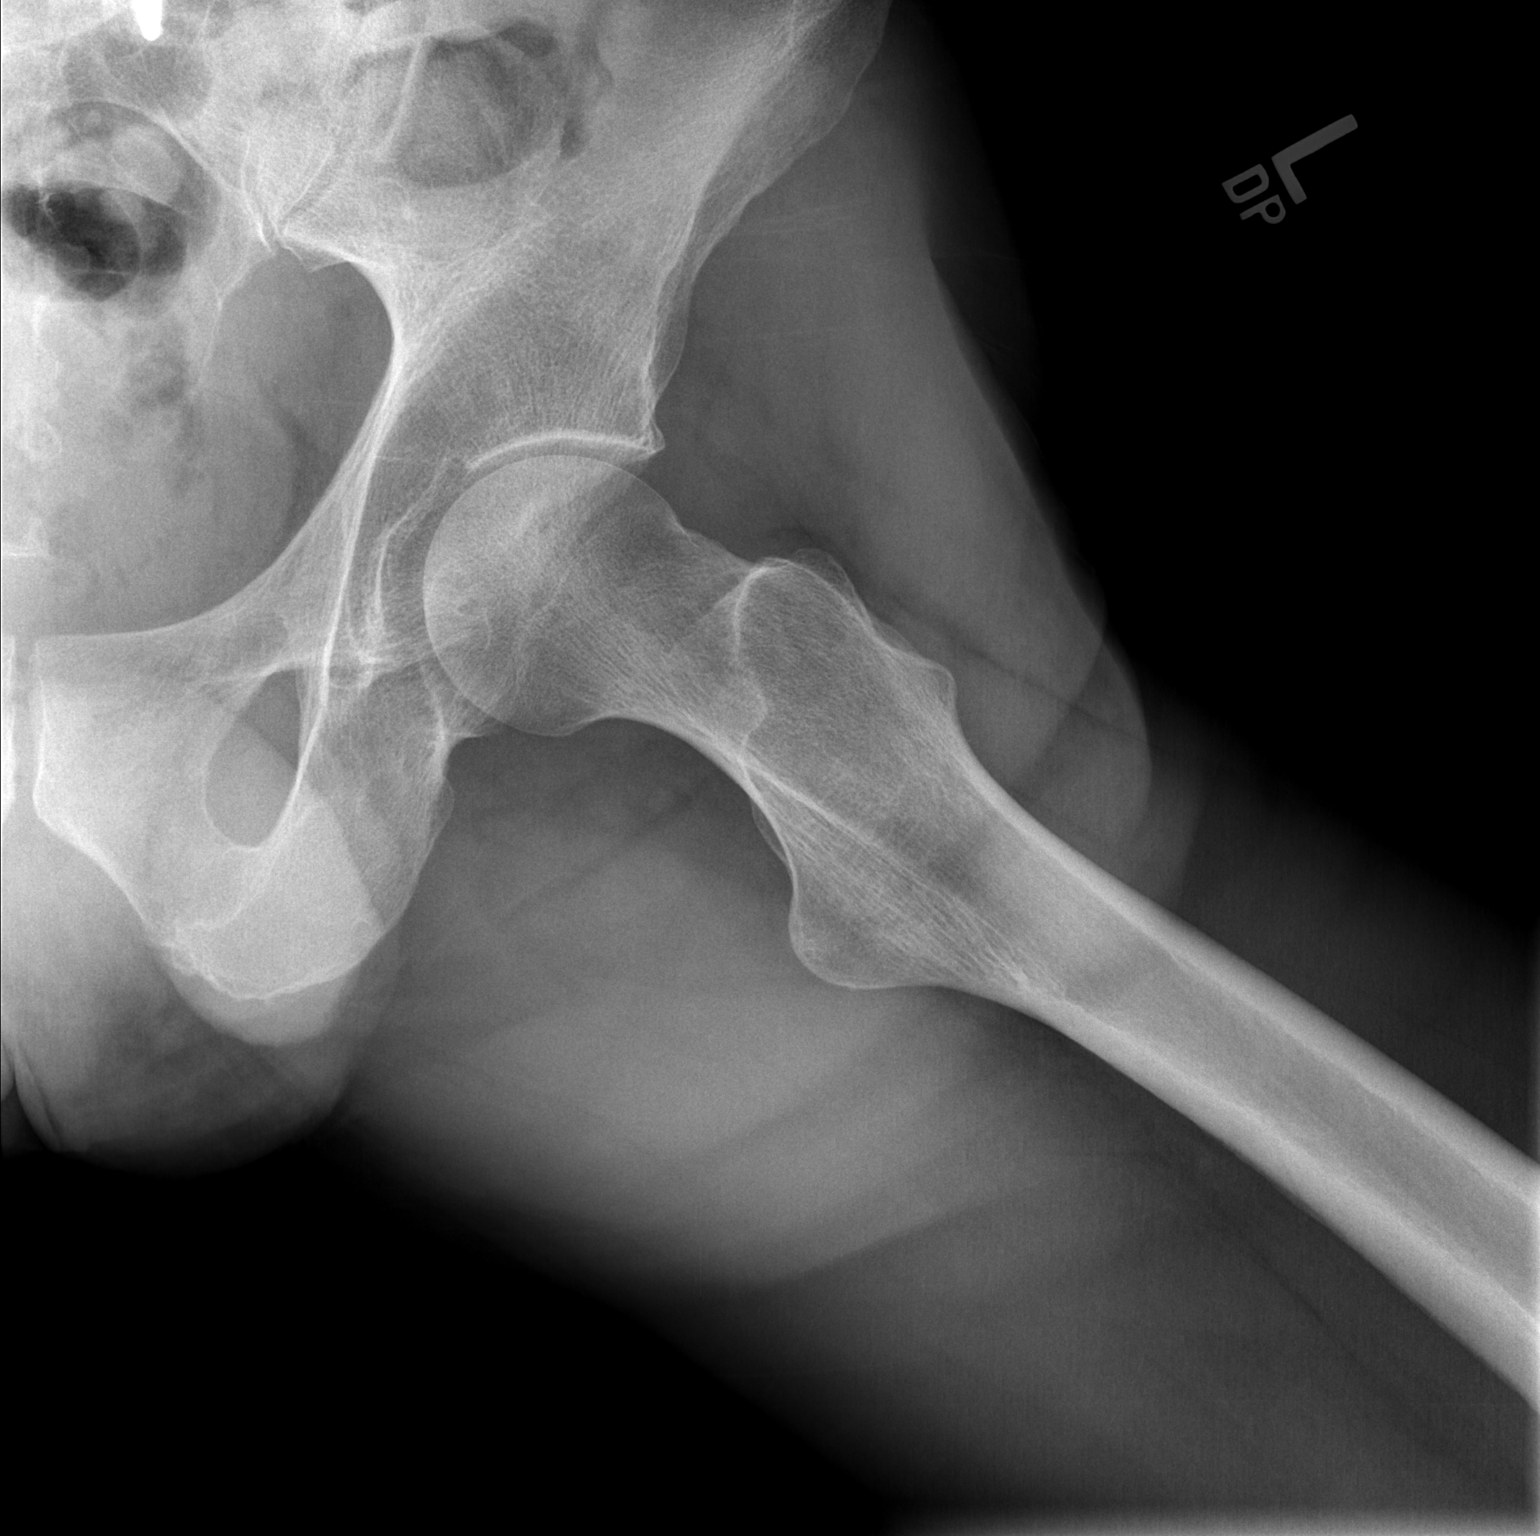

[2 of 2 positions shown; findings below may reference images not displayed]

FINDINGS: There is no evidence of hip fracture or dislocation. There is no
evidence of arthropathy or other focal bone abnormality.
IMPRESSION: No acute abnormality noted.

## 2022-08-05 DIAGNOSIS — J3081 Allergic rhinitis due to animal (cat) (dog) hair and dander: Secondary | ICD-10-CM | POA: Diagnosis not present

## 2022-08-05 DIAGNOSIS — J301 Allergic rhinitis due to pollen: Secondary | ICD-10-CM | POA: Diagnosis not present

## 2022-08-05 DIAGNOSIS — J3089 Other allergic rhinitis: Secondary | ICD-10-CM | POA: Diagnosis not present

## 2022-08-06 ENCOUNTER — Other Ambulatory Visit (INDEPENDENT_AMBULATORY_CARE_PROVIDER_SITE_OTHER): Payer: BC Managed Care – PPO

## 2022-08-06 DIAGNOSIS — E039 Hypothyroidism, unspecified: Secondary | ICD-10-CM

## 2022-08-06 LAB — T4, FREE: Free T4: 1.49 ng/dL (ref 0.60–1.60)

## 2022-08-06 LAB — TSH: TSH: 0.12 u[IU]/mL — ABNORMAL LOW (ref 0.35–5.50)

## 2022-08-07 ENCOUNTER — Telehealth: Payer: Self-pay | Admitting: Endocrinology

## 2022-08-07 NOTE — Telephone Encounter (Signed)
Patient is calling to say that he does not think that all labs were drawn at last lab appointment and would like for someone to check on this and get back with him before his appointment with Dr. Lucianne Muss on Friday 08/09/2022.

## 2022-08-08 ENCOUNTER — Other Ambulatory Visit: Payer: Self-pay

## 2022-08-08 ENCOUNTER — Other Ambulatory Visit: Payer: Self-pay | Admitting: Endocrinology

## 2022-08-08 ENCOUNTER — Other Ambulatory Visit (INDEPENDENT_AMBULATORY_CARE_PROVIDER_SITE_OTHER): Payer: BC Managed Care – PPO

## 2022-08-08 DIAGNOSIS — E230 Hypopituitarism: Secondary | ICD-10-CM | POA: Diagnosis not present

## 2022-08-08 DIAGNOSIS — E291 Testicular hypofunction: Secondary | ICD-10-CM | POA: Diagnosis not present

## 2022-08-08 LAB — TESTOSTERONE: Testosterone: 622.55 ng/dL (ref 300.00–890.00)

## 2022-08-09 ENCOUNTER — Ambulatory Visit (INDEPENDENT_AMBULATORY_CARE_PROVIDER_SITE_OTHER): Payer: BC Managed Care – PPO | Admitting: Endocrinology

## 2022-08-09 ENCOUNTER — Telehealth: Payer: BC Managed Care – PPO | Admitting: Physician Assistant

## 2022-08-09 ENCOUNTER — Encounter: Payer: Self-pay | Admitting: Endocrinology

## 2022-08-09 VITALS — BP 112/64 | HR 89 | Wt 156.0 lb

## 2022-08-09 DIAGNOSIS — B9689 Other specified bacterial agents as the cause of diseases classified elsewhere: Secondary | ICD-10-CM

## 2022-08-09 DIAGNOSIS — E230 Hypopituitarism: Secondary | ICD-10-CM | POA: Diagnosis not present

## 2022-08-09 DIAGNOSIS — E039 Hypothyroidism, unspecified: Secondary | ICD-10-CM

## 2022-08-09 DIAGNOSIS — J019 Acute sinusitis, unspecified: Secondary | ICD-10-CM

## 2022-08-09 MED ORDER — AMOXICILLIN-POT CLAVULANATE 875-125 MG PO TABS
1.0000 | ORAL_TABLET | Freq: Two times a day (BID) | ORAL | 0 refills | Status: DC
Start: 2022-08-09 — End: 2022-08-28

## 2022-08-09 MED ORDER — LEVOTHYROXINE SODIUM 137 MCG PO TABS: 137.0000 ug | ORAL_TABLET | Freq: Every day | ORAL | 1 refills | Status: DC

## 2022-08-09 MED ORDER — JATENZO 198 MG PO CAPS: ORAL_CAPSULE | ORAL | 5 refills | Status: DC

## 2022-08-09 NOTE — Progress Notes (Signed)

## 2022-08-09 NOTE — Patient Instructions (Signed)
New thyroid Rx   

## 2022-08-09 NOTE — Progress Notes (Signed)
Patient ID: Joseph Santana, male   DOB: 1955-10-10, 67 y.o.   MRN: [id number]            Reason for Appointment:  Follow-up of low testosterone      History of Present Illness:   HYPOGONADISM: Prior history: He was diagnosed to have a low testosterone level in 2012 or so when he was coming to his physician with complaints of increased fatigue.  Not clear what his baseline testosterone level was but he had a level of about 130 in 2013 around the time when he was having back surgery No record of detail evaluation with pituitary function available He thinks he was tried on AndroGel initially but because of skin irritation he was changed to testosterone injections.  He may have been prescribed Axiron also but does not remember this He thinks he had significantly better with taking testosterone injections, apparently was taking about 100 mg every 2 weeks He stopped taking the injections 6-8 months prior to his initial consultation  as they were not renewed and he had a change in his PCP  RECENT history: On his initial consultation he was complaining of fatigue  Baseline testosterone was 208 with LH of 2.1 and normal prolactin, free testosterone not done by the lab  He had been on a trial of clomiphene half tablet, initially 3 times a week since 01/11/16. With this he had more energy after starting treatment With this his testosterone level came back to normal until 2/18 when it was 494, subsequently had been progressively declining and was 273 done in 1/19 This was despite taking 50 mg 5 days a week; with this he was complaining of more fatigue, sluggishness and decreased libido and motivation  RECENT history: Previously on Fortesta gel but this was changed because of redness, irritation and sunburn-like reaction on the skin Because of the skin irritation he was changed to the Axiron preparation, but this could not keep his levels consistent  He was started on Jatenzo in 06/2019 after  approval through his insurance  He was advised to take his morning dose with breakfast and evening dose with dinner and have a small amount of fat at least with each meal  Initially started on 158 mg and subsequently taking 198 mg twice a day since 02/2020 He was feeling fatigued on the 158 mg which was given to him when he had a high testosterone level in 5/23 Now since 7/23 he has been on 198 mg again  Since his last visit has not had any change in his energy level and feels fairly good Able to do his exercise routine as before Is taking his medication regularly  His testosterone level was done about 2 hours after his morning dose and is now excellent at 623  His hemoglobin and also last PSA have been consistently normal   He has had the following testosterone levels:  Lab Results  Component Value Date   TESTOSTERONE 622.55 08/07/2022   TESTOSTERONE 583.97 01/29/2022   TESTOSTERONE 430.30 10/17/2021   TESTOSTERONE 430.78 09/26/2021    Lab Results  Component Value Date   LH 3.39 03/03/2017   LH 2.94 02/28/2016   LH 2.10 01/10/2016   Lab Results  Component Value Date   HGB 15.9 01/29/2022   Hypothyroidism was first diagnosed  about 30 years ago  At the time of diagnosis patient was having symptoms of  fatigue but he is not sure about all the other symptoms Most probably tested also because  of strong family history of thyroid disease  He takes his thyroid supplement regularly  Since about 12/2021 he has switched to taking his levothyroxine at  bedtime which is about 3 hours after dinner He did this because he could not comply with taking it 30 to 60 minutes before breakfast in the morning  He had been on levothyroxine 150 mcg daily  His dose was decreased in 10/23 with stopping the 12.5 mg supplement when TSH was 0.1 However he did not come back for follow-up labs in November as recommended  No symptoms of new fatigue but also no symptoms of shakiness, heat intolerance  or palpitations No recent weight change TSH is again suppressed          Patient's weight history is as follows:  Wt Readings from Last 3 Encounters:  08/09/22 156 lb (70.8 kg)  02/01/22 158 lb (71.7 kg)  12/12/21 150 lb (68 kg)    Thyroid function results have been as follows:  Lab Results  Component Value Date   TSH 0.12 (L) 08/06/2022   TSH 0.10 (L) 01/29/2022   TSH 0.70 07/19/2021   FREET4 1.49 08/06/2022   FREET4 1.17 07/19/2021   FREET4 1.12 06/13/2020      Past Medical History:  Diagnosis Date   ALLERGIC REACTION 07/22/2009   ALLERGIC RHINITIS 10/08/2006   B12 DEFICIENCY 05/14/2010   Carpal tunnel syndrome 12/31/2007   Degeneration of cervical intervertebral disc 12/31/2007   HYPERLIPIDEMIA 10/08/2006   Hypogonadism in male 06/26/2009   Qualifier: Diagnosis of  By: Lovell Sheehan MD, John E    HYPOTHYROIDISM 10/08/2006   Irritable bowel syndrome 03/03/2008   LUMBAR RADICULOPATHY, RIGHT 12/31/2006   OSTEOARTHRITIS, LOWER LEG, LEFT 05/10/2009   SINUSITIS - ACUTE-NOS 07/22/2009   TESTICULAR HYPOFUNCTION 06/26/2009    Past Surgical History:  Procedure Laterality Date   BACK SURGERY     I & D EXTREMITY Right 05/24/2020   Procedure: IRRIGATION AND DEBRIDEMENT RIGHT THUMB;  Surgeon: Tarry Kos, MD;  Location: [facility];  Service: Orthopedics;  Laterality: Right;   KNEE SURGERY     LUMBAR LAMINECTOMY  2008   with revision   SPINAL FUSION  2011   spinal fusion revision  2012    Family History  Problem Relation Age of Onset   Hypothyroidism Father    Asthma Other    Diabetes Other    Allergy (severe) Other    Hypothyroidism Mother    Hypothyroidism Sister    Hypothyroidism Brother     Social History:  reports that he has never smoked. He has never used smokeless tobacco. He reports that he does not drink alcohol and does not use drugs.  Allergies:  Allergies  Allergen Reactions   Iodine     IVP dye    Levofloxacin     Allergies as of 08/09/2022        Reactions   Iodine    IVP dye   Levofloxacin         Medication List        Accurate as of August 09, 2022  8:24 AM. If you have any questions, ask your nurse or doctor.          albuterol 108 (90 Base) MCG/ACT inhaler Commonly known as: VENTOLIN HFA Inhale 2 puffs into the lungs every 6 (six) hours as needed for wheezing or shortness of breath.   EPINEPHrine 0.3 mg/0.3 mL Soaj injection Commonly known as: EPI-PEN EpiPen 2-Pak 0.3 mg/0.3 mL injection,  auto-injector   Jatenzo 198 MG Caps Generic drug: Testosterone Undecanoate 1 CAPSULE WITH BREAKFAST AND 1 CAPSULE WITH DINNER DAILY   levothyroxine 150 MCG tablet Commonly known as: SYNTHROID TAKE 1 TABLET BY MOUTH 30 MINUTES BEFORE BREAKFAST EVERY DAY.   montelukast 10 MG tablet Commonly known as: SINGULAIR Take 10 mg by mouth at bedtime.   multivitamin capsule Take 1 capsule by mouth daily.   NASACORT ALLERGY 24HR NA Place into the nose.   predniSONE 10 MG tablet Commonly known as: DELTASONE 2 tabs by mouth per day for 5 days,        Review of Systems  He has had chronic insomnia        Examination:    BP 112/64 (BP Location: Left Arm, Patient Position: Sitting, Cuff Size: Normal)   Pulse 89   Wt 156 lb (70.8 kg)   SpO2 96%   BMI 22.38 kg/m   Thyroid not palpable Biceps reflexes did not abnormal relaxation No peripheral edema   Assessment:  HYPOGONADISM:   He has hypogonadotropic hypogonadism with baseline testosterone of 208 and has been symptomatic He is taking Jatenzo 198 mg twice daily with food He has subjectively doing well with normal energy level Able to exercise Testosterone level is quite normal and similar to the previous level  HYPOTHYROIDISM, likely autoimmune related, on levothyroxine 150 mcg daily since his last visit No goiter on exam and on exam he looks euthyroid  He may be absorbing the levothyroxine better with taking it 3 hours after his meals rather than  right before breakfast in the morning However his TSH is still low at 0.12, currently asymptomatic   PLAN:   He will continue the same dose of Jatenzo 90-day prescription sent  Levothyroxine to be changed to 137 mcg He can continue taking this at bedtime He will have another TSH done in about 6 weeks  Follow-up in 6 months for regular visit  Reather Littler 08/09/2022, 8:24 AM

## 2022-08-12 ENCOUNTER — Other Ambulatory Visit (HOSPITAL_COMMUNITY): Payer: Self-pay

## 2022-08-12 ENCOUNTER — Telehealth: Payer: Self-pay | Admitting: Endocrinology

## 2022-08-12 ENCOUNTER — Telehealth: Payer: Self-pay | Admitting: Pharmacy Technician

## 2022-08-12 NOTE — Telephone Encounter (Signed)
Pharmacy Patient Advocate Encounter   Received notification from pt calls/CMA that prior authorization renewal for Joseph Santana is required/requested.   PA submitted on 08/12/22 to (ins) [employer] via CoverMyMeds Key or (Medicaid) confirmation # [id number] Status is pending

## 2022-08-12 NOTE — Telephone Encounter (Signed)
Joseph Santana needs PA

## 2022-08-12 NOTE — Telephone Encounter (Signed)
MEDICATION:  Jatenzo JATENZO 198 MG CAPS  PHARMACY:    [id number] - [place], [place] - [address] AT [address] (Ph: [phone])    HAS THE PATIENT CONTACTED THEIR PHARMACY?  Yes  IS THIS A 90 DAY SUPPLY : Yes  IS PATIENT OUT OF MEDICATION: Yes  IF NOT; HOW MUCH IS LEFT:   LAST APPOINTMENT DATE: @4 /26/2024  NEXT APPOINTMENT DATE:@Not  Scheduled  DO WE HAVE YOUR PERMISSION TO LEAVE A DETAILED MESSAGE?:  Yes  OTHER COMMENTS: Patient states that a prior authorization needs to be done ASAP.  CoverMyMeds is supposed to be told that it is an urgent and contacted  at [phone].   **Let patient know to contact pharmacy at the end of the day to make sure medication is ready. **  ** Please notify patient to allow 48-72 hours to process**  **Encourage patient to contact the pharmacy for refills or they can request refills through [facility]

## 2022-08-12 NOTE — Telephone Encounter (Signed)
PA request submitted. New Encounter created for follow up. For additional info see Prior Auth telephone encounter from 08/12/22.

## 2022-08-13 NOTE — Telephone Encounter (Signed)
Pharmacy Patient Advocate Encounter  Prior Authorization for Joseph Santana has been approved   Effective dates: 08/12/2022 through 02/11/2023

## 2022-08-14 DIAGNOSIS — H5213 Myopia, bilateral: Secondary | ICD-10-CM | POA: Diagnosis not present

## 2022-08-14 DIAGNOSIS — H524 Presbyopia: Secondary | ICD-10-CM | POA: Diagnosis not present

## 2022-08-14 DIAGNOSIS — H25013 Cortical age-related cataract, bilateral: Secondary | ICD-10-CM | POA: Diagnosis not present

## 2022-08-14 DIAGNOSIS — H2513 Age-related nuclear cataract, bilateral: Secondary | ICD-10-CM | POA: Diagnosis not present

## 2022-08-14 DIAGNOSIS — H348122 Central retinal vein occlusion, left eye, stable: Secondary | ICD-10-CM | POA: Diagnosis not present

## 2022-08-14 DIAGNOSIS — H52203 Unspecified astigmatism, bilateral: Secondary | ICD-10-CM | POA: Diagnosis not present

## 2022-08-19 DIAGNOSIS — J3089 Other allergic rhinitis: Secondary | ICD-10-CM | POA: Diagnosis not present

## 2022-08-19 DIAGNOSIS — J301 Allergic rhinitis due to pollen: Secondary | ICD-10-CM | POA: Diagnosis not present

## 2022-08-19 DIAGNOSIS — J3081 Allergic rhinitis due to animal (cat) (dog) hair and dander: Secondary | ICD-10-CM | POA: Diagnosis not present

## 2022-08-28 ENCOUNTER — Ambulatory Visit (INDEPENDENT_AMBULATORY_CARE_PROVIDER_SITE_OTHER): Payer: BC Managed Care – PPO | Admitting: Internal Medicine

## 2022-08-28 VITALS — BP 104/68 | HR 53 | Temp 98.0°F | Ht 70.0 in | Wt 151.5 lb

## 2022-08-28 DIAGNOSIS — Z0001 Encounter for general adult medical examination with abnormal findings: Secondary | ICD-10-CM

## 2022-08-28 DIAGNOSIS — R739 Hyperglycemia, unspecified: Secondary | ICD-10-CM

## 2022-08-28 DIAGNOSIS — E559 Vitamin D deficiency, unspecified: Secondary | ICD-10-CM | POA: Diagnosis not present

## 2022-08-28 DIAGNOSIS — S82102D Unspecified fracture of upper end of left tibia, subsequent encounter for closed fracture with routine healing: Secondary | ICD-10-CM | POA: Diagnosis not present

## 2022-08-28 DIAGNOSIS — E039 Hypothyroidism, unspecified: Secondary | ICD-10-CM

## 2022-08-28 DIAGNOSIS — E7800 Pure hypercholesterolemia, unspecified: Secondary | ICD-10-CM

## 2022-08-28 DIAGNOSIS — Z125 Encounter for screening for malignant neoplasm of prostate: Secondary | ICD-10-CM

## 2022-08-28 DIAGNOSIS — E538 Deficiency of other specified B group vitamins: Secondary | ICD-10-CM

## 2022-08-28 DIAGNOSIS — N401 Enlarged prostate with lower urinary tract symptoms: Secondary | ICD-10-CM

## 2022-08-28 DIAGNOSIS — R351 Nocturia: Secondary | ICD-10-CM

## 2022-08-28 DIAGNOSIS — E291 Testicular hypofunction: Secondary | ICD-10-CM

## 2022-08-28 DIAGNOSIS — J309 Allergic rhinitis, unspecified: Secondary | ICD-10-CM

## 2022-08-28 LAB — CBC WITH DIFFERENTIAL/PLATELET
Basophils Absolute: 0.1 10*3/uL (ref 0.0–0.1)
Basophils Relative: 1 % (ref 0.0–3.0)
Eosinophils Absolute: 0.2 10*3/uL (ref 0.0–0.7)
Eosinophils Relative: 2.9 % (ref 0.0–5.0)
HCT: 45.8 % (ref 39.0–52.0)
Hemoglobin: 15.8 g/dL (ref 13.0–17.0)
Lymphocytes Relative: 24 % (ref 12.0–46.0)
Lymphs Abs: 1.3 10*3/uL (ref 0.7–4.0)
MCHC: 34.6 g/dL (ref 30.0–36.0)
MCV: 88.2 fl (ref 78.0–100.0)
Monocytes Absolute: 0.6 10*3/uL (ref 0.1–1.0)
Monocytes Relative: 10.8 % (ref 3.0–12.0)
Neutro Abs: 3.4 10*3/uL (ref 1.4–7.7)
Neutrophils Relative %: 61.3 % (ref 43.0–77.0)
Platelets: 272 10*3/uL (ref 150.0–400.0)
RBC: 5.19 Mil/uL (ref 4.22–5.81)
RDW: 13.4 % (ref 11.5–15.5)
WBC: 5.6 10*3/uL (ref 4.0–10.5)

## 2022-08-28 LAB — LIPID PANEL
Cholesterol: 156 mg/dL (ref 0–200)
HDL: 44.7 mg/dL (ref >39.00)
LDL Cholesterol: 98 mg/dL (ref 0–99)
NonHDL: 110.89
Total CHOL/HDL Ratio: 3
Triglycerides: 65 mg/dL (ref 0.0–149.0)
VLDL: 13 mg/dL (ref 0.0–40.0)

## 2022-08-28 LAB — BASIC METABOLIC PANEL
BUN: 21 mg/dL (ref 6–23)
CO2: 33 mEq/L — ABNORMAL HIGH (ref 19–32)
Calcium: 9.8 mg/dL (ref 8.4–10.5)
Chloride: 99 mEq/L (ref 96–112)
Creatinine, Ser: 1.03 mg/dL (ref 0.40–1.50)
GFR: 76.7 mL/min (ref >60.00)
Glucose, Bld: 84 mg/dL (ref 70–99)
Potassium: 4.9 mEq/L (ref 3.5–5.1)
Sodium: 139 mEq/L (ref 135–145)

## 2022-08-28 LAB — URINALYSIS, ROUTINE W REFLEX MICROSCOPIC
Bilirubin Urine: NEGATIVE
Hgb urine dipstick: NEGATIVE
Ketones, ur: NEGATIVE
Leukocytes,Ua: NEGATIVE
Nitrite: NEGATIVE
RBC / HPF: NONE SEEN (ref 0–?)
Specific Gravity, Urine: 1.015 (ref 1.000–1.030)
Total Protein, Urine: NEGATIVE
Urine Glucose: NEGATIVE
Urobilinogen, UA: 0.2 (ref 0.0–1.0)
WBC, UA: NONE SEEN (ref 0–?)
pH: 7.5 (ref 5.0–8.0)

## 2022-08-28 LAB — HEPATIC FUNCTION PANEL
ALT: 22 U/L (ref 0–53)
AST: 25 U/L (ref 0–37)
Albumin: 4.2 g/dL (ref 3.5–5.2)
Alkaline Phosphatase: 49 U/L (ref 39–117)
Bilirubin, Direct: 0.1 mg/dL (ref 0.0–0.3)
Total Bilirubin: 0.7 mg/dL (ref 0.2–1.2)
Total Protein: 6.7 g/dL (ref 6.0–8.3)

## 2022-08-28 LAB — VITAMIN D 25 HYDROXY (VIT D DEFICIENCY, FRACTURES): VITD: 65.45 ng/mL (ref 30.00–100.00)

## 2022-08-28 LAB — VITAMIN B12: Vitamin B-12: 1055 pg/mL — ABNORMAL HIGH (ref 211–911)

## 2022-08-28 LAB — HEMOGLOBIN A1C: Hgb A1c MFr Bld: 5.4 % (ref 4.6–6.5)

## 2022-08-28 LAB — PSA: PSA: 1.25 ng/mL (ref 0.10–4.00)

## 2022-08-28 MED ORDER — TADALAFIL 5 MG PO TABS
5.0000 mg | ORAL_TABLET | Freq: Every day | ORAL | 3 refills | Status: DC
Start: 2022-08-28 — End: 2023-01-01

## 2022-08-28 NOTE — Progress Notes (Signed)
The test results show that your current treatment is OK, as the tests are stable.  Please continue the same plan.  There is no other need for change of treatment or further evaluation based on these results, at this time.  thanks 

## 2022-08-28 NOTE — Progress Notes (Signed)
Patient ID: Joseph Santana, male   DOB: 06-20-1955, 67 y.o.   MRN: [id number]         Chief Complaint:: wellness exam and bph with nocturia, low thyroid, low testosterone, allergies       HPI:  Joseph Santana is a 67 y.o. male here for wellness exam; decines covid booster, o/w up to date                        Also overall good stamina and adequate testosterone level per pt.  Does have mild worsening bph symptoms with nocturia.  Retiring in 3 wks.  Levothyroxine reduced to 137 mcg recently per endo.  Pt denies chest pain, increased sob or doe, wheezing, orthopnea, PND, increased LE swelling, palpitations, dizziness or syncope.   Pt denies polydipsia, polyuria, or new focal neuro s/s.   Pt denies fever, wt loss, night sweats, loss of appetite, or other constitutional symptoms  Denies hyper or hypo thyroid symptoms such as voice, skin or hair change.   Wt Readings from Last 3 Encounters:  08/28/22 151 lb 8 oz (68.7 kg)  08/09/22 156 lb (70.8 kg)  02/01/22 158 lb (71.7 kg)   BP Readings from Last 3 Encounters:  08/28/22 104/68  08/09/22 112/64  02/13/22 110/70   Immunization History  Administered Date(s) Administered   Influenza Split 02/15/2011, 02/12/2012   Influenza Whole 12/31/2007, 01/13/2009, 02/13/2010, 02/04/2014   Influenza, High Dose Seasonal PF 01/24/2015, 01/01/2016, 07/06/2019, 04/05/2020   Influenza,inj,Quad PF,6+ Mos 01/22/2013, 12/28/2015, 01/14/2017, 01/14/2018, 01/07/2019, 12/27/2020   PFIZER(Purple Top)SARS-COV-2 Vaccination 06/28/2019, 07/19/2019, 03/05/2020, 04/05/2020   Pfizer Covid-19 Vaccine Bivalent Booster 97yrs & up 01/02/2021   Td 01/01/2016   Tdap 12/28/2015   Zoster Recombinat (Shingrix) 11/10/2020, 01/08/2021   There are no preventive care reminders to display for this patient.     Past Medical History:  Diagnosis Date   ALLERGIC REACTION 07/22/2009   ALLERGIC RHINITIS 10/08/2006   B12 DEFICIENCY 05/14/2010   Carpal tunnel syndrome 12/31/2007    Degeneration of cervical intervertebral disc 12/31/2007   HYPERLIPIDEMIA 10/08/2006   Hypogonadism in male 06/26/2009   Qualifier: Diagnosis of  By: Lovell Sheehan MD, Breton Berns E    HYPOTHYROIDISM 10/08/2006   Irritable bowel syndrome 03/03/2008   LUMBAR RADICULOPATHY, RIGHT 12/31/2006   OSTEOARTHRITIS, LOWER LEG, LEFT 05/10/2009   SINUSITIS - ACUTE-NOS 07/22/2009   TESTICULAR HYPOFUNCTION 06/26/2009   Past Surgical History:  Procedure Laterality Date   BACK SURGERY     I & D EXTREMITY Right 05/24/2020   Procedure: IRRIGATION AND DEBRIDEMENT RIGHT THUMB;  Surgeon: Tarry Kos, MD;  [facility];  Service: Orthopedics;  Laterality: Right;   KNEE SURGERY     LUMBAR LAMINECTOMY  2008   with revision   SPINAL FUSION  2011   spinal fusion revision  2012    reports that he has never smoked. He has never used smokeless tobacco. He reports that he does not drink alcohol and does not use drugs. family history includes Allergy (severe) in an other family member; Asthma in an other family member; Diabetes in an other family member; Hypothyroidism in his brother, father, mother, and sister. Allergies  Allergen Reactions   Iodine     IVP dye    Levofloxacin    Current Outpatient Medications on File Prior to Visit  Medication Sig Dispense Refill   EPINEPHrine 0.3 mg/0.3 mL IJ SOAJ injection EpiPen 2-Pak 0.3 mg/0.3 mL injection, auto-injector  JATENZO 198 MG CAPS 1 CAPSULE WITH BREAKFAST AND 1 CAPSULE WITH DINNER DAILY 180 capsule 5   levothyroxine (SYNTHROID) 137 MCG tablet Take 1 tablet (137 mcg total) by mouth daily before breakfast. 90 tablet 1   montelukast (SINGULAIR) 10 MG tablet Take 10 mg by mouth at bedtime.     Multiple Vitamin (MULTIVITAMIN) capsule Take 1 capsule by mouth daily.     Triamcinolone Acetonide (NASACORT ALLERGY 24HR NA) Place into the nose.     No current facility-administered medications on file prior to visit.        ROS:  All others reviewed and  negative.  Objective        PE:  BP 104/68   Pulse (!) 53   Temp 98 F (36.7 C) (Temporal)   Ht 5\' 10"  (1.778 m)   Wt 151 lb 8 oz (68.7 kg)   SpO2 99%   BMI 21.74 kg/m                 Constitutional: Pt appears in NAD               HENT: Head: NCAT.                Right Ear: External ear normal.                 Left Ear: External ear normal.                Eyes: . Pupils are equal, round, and reactive to light. Conjunctivae and EOM are normal               Nose: without d/c or deformity               Neck: Neck supple. Gross normal ROM               Cardiovascular: Normal rate and regular rhythm.                 Pulmonary/Chest: Effort normal and breath sounds without rales or wheezing.                Abd:  Soft, NT, ND, + BS, no organomegaly               Neurological: Pt is alert. At baseline orientation, motor grossly intact               Skin: Skin is warm. No rashes, no other new lesions, LE edema - none               Psychiatric: Pt behavior is normal without agitation   Micro: none  Cardiac tracings I have personally interpreted today:  none  Pertinent Radiological findings (summarize): none   Lab Results  Component Value Date   WBC 5.6 08/28/2022   HGB 15.8 08/28/2022   HCT 45.8 08/28/2022   PLT 272.0 08/28/2022   GLUCOSE 84 08/28/2022   CHOL 156 08/28/2022   TRIG 65.0 08/28/2022   HDL 44.70 08/28/2022   LDLDIRECT 146.2 11/19/2012   LDLCALC 98 08/28/2022   ALT 22 08/28/2022   AST 25 08/28/2022   NA 139 08/28/2022   K 4.9 08/28/2022   CL 99 08/28/2022   CREATININE 1.03 08/28/2022   BUN 21 08/28/2022   CO2 33 (H) 08/28/2022   TSH 0.12 (L) 08/06/2022   PSA 1.25 08/28/2022   HGBA1C 5.4 08/28/2022   Assessment/Plan:  Joseph Santana is a 67 y.o. White or Caucasian [1]  male with  has a past medical history of ALLERGIC REACTION (07/22/2009), ALLERGIC RHINITIS (10/08/2006), B12 DEFICIENCY (05/14/2010), Carpal tunnel syndrome (12/31/2007), Degeneration of cervical  intervertebral disc (12/31/2007), HYPERLIPIDEMIA (10/08/2006), Hypogonadism in male (06/26/2009), HYPOTHYROIDISM (10/08/2006), Irritable bowel syndrome (03/03/2008), LUMBAR RADICULOPATHY, RIGHT (12/31/2006), OSTEOARTHRITIS, LOWER LEG, LEFT (05/10/2009), SINUSITIS - ACUTE-NOS (07/22/2009), and TESTICULAR HYPOFUNCTION (06/26/2009).  Encounter for well adult exam with abnormal findings Age and sex appropriate education and counseling updated with regular exercise and diet Referrals for preventative services - none needed Immunizations addressed - declines covid bosoter Smoking counseling  - none needed Evidence for depression or other mood disorder - none significant Most recent labs reviewed. I have personally reviewed and have noted: 1) the patient's medical and social history 2) The patient's current medications and supplements 3) The patient's height, weight, and BMI have been recorded in the chart   Hypothyroidism Lab Results  Component Value Date   TSH 0.12 (L) 08/06/2022   Stable, pt to continue levothyroxine at 137 after recent reduced - for f/u tsh per endo   Hypogonadism in male Pt states normal level testosterone recently, cont same tx, f/u endo as planned  Allergic rhinitis Stable, to continue singulari 10 qd  Closed fracture of upper end of left tibia with routine healing Avid runner, has recent hx of fx, for DXA  BPH associated with nocturia Mild to mod, for cialis 5 mg dialy,,  to f/u any worsening symptoms or concerns  Pure hypercholesterolemia Improved,  Lab Results  Component Value Date   CHOL 156 08/28/2022   HDL 44.70 08/28/2022   LDLCALC 98 08/28/2022   LDLDIRECT 146.2 11/19/2012   TRIG 65.0 08/28/2022   CHOLHDL 3 08/28/2022  Cont diet, wt control  Followup: Return in about 1 year (around 08/28/2023).  Oliver Barre, MD 08/31/2022 7:53 PM [facility]

## 2022-08-28 NOTE — Patient Instructions (Addendum)
Please call [facility] to check on need for followup colonoscopy  Please schedule the bone density test before leaving today at the scheduling desk (where you check out)  Please take all new medication as prescribed - the cialis 5 mg per day for prostate  Please continue all other medications as before, and refills have been done if requested.  Please have the pharmacy call with any other refills you may need.  Please continue your efforts at being more active, low cholesterol diet, and weight control.  You are otherwise up to date with prevention measures today.  Please keep your appointments with your specialists as you may have planned  Please go to the LAB at the blood drawing area for the tests to be done  You will be contacted by phone if any changes need to be made immediately.  Otherwise, you will receive a letter about your results with an explanation, but please check with MyChart first.  Please make an Appointment to return for your 1 year visit, or sooner if needed

## 2022-08-29 DIAGNOSIS — J3089 Other allergic rhinitis: Secondary | ICD-10-CM | POA: Diagnosis not present

## 2022-08-29 DIAGNOSIS — J301 Allergic rhinitis due to pollen: Secondary | ICD-10-CM | POA: Diagnosis not present

## 2022-08-29 DIAGNOSIS — J3081 Allergic rhinitis due to animal (cat) (dog) hair and dander: Secondary | ICD-10-CM | POA: Diagnosis not present

## 2022-08-30 LAB — PTH, INTACT AND CALCIUM
Calcium: 10.1 mg/dL (ref 8.6–10.3)
PTH: 13 pg/mL — ABNORMAL LOW (ref 16–77)

## 2022-08-30 NOTE — Progress Notes (Signed)
The test results show that your current treatment is OK, as the tests are stable.  Please continue the same plan.  There is no other need for change of treatment or further evaluation based on these results, at this time.  thanks 

## 2022-08-31 ENCOUNTER — Encounter: Payer: Self-pay | Admitting: Internal Medicine

## 2022-08-31 DIAGNOSIS — N401 Enlarged prostate with lower urinary tract symptoms: Secondary | ICD-10-CM | POA: Insufficient documentation

## 2022-08-31 DIAGNOSIS — E7800 Pure hypercholesterolemia, unspecified: Secondary | ICD-10-CM | POA: Insufficient documentation

## 2022-08-31 NOTE — Assessment & Plan Note (Signed)
Lab Results  Component Value Date   TSH 0.12 (L) 08/06/2022   Stable, pt to continue levothyroxine at 137 after recent reduced - for f/u tsh per endo

## 2022-08-31 NOTE — Assessment & Plan Note (Signed)
Age and sex appropriate education and counseling updated with regular exercise and diet Referrals for preventative services - none needed Immunizations addressed - declines covid bosoter Smoking counseling  - none needed Evidence for depression or other mood disorder - none significant Most recent labs reviewed. I have personally reviewed and have noted: 1) the patient's medical and social history 2) The patient's current medications and supplements 3) The patient's height, weight, and BMI have been recorded in the chart

## 2022-08-31 NOTE — Assessment & Plan Note (Signed)
Improved,  Lab Results  Component Value Date   CHOL 156 08/28/2022   HDL 44.70 08/28/2022   LDLCALC 98 08/28/2022   LDLDIRECT 146.2 11/19/2012   TRIG 65.0 08/28/2022   CHOLHDL 3 08/28/2022  Cont diet, wt control

## 2022-08-31 NOTE — Assessment & Plan Note (Signed)
Mild to mod, for cialis 5 mg dialy,,  to f/u any worsening symptoms or concerns

## 2022-08-31 NOTE — Assessment & Plan Note (Signed)
Stable, to continue singulari 10 qd

## 2022-08-31 NOTE — Assessment & Plan Note (Signed)
Avid runner, has recent hx of fx, for DXA

## 2022-08-31 NOTE — Assessment & Plan Note (Signed)
Pt states normal level testosterone recently, cont same tx, f/u endo as planned

## 2022-09-02 ENCOUNTER — Ambulatory Visit (INDEPENDENT_AMBULATORY_CARE_PROVIDER_SITE_OTHER)
Admission: RE | Admit: 2022-09-02 | Discharge: 2022-09-02 | Disposition: A | Discharge status: Home / Self Care | Payer: BC Managed Care – PPO | Source: Ambulatory Visit | Attending: Internal Medicine | Admitting: Internal Medicine

## 2022-09-02 DIAGNOSIS — S82102D Unspecified fracture of upper end of left tibia, subsequent encounter for closed fracture with routine healing: Secondary | ICD-10-CM | POA: Diagnosis not present

## 2022-09-05 DIAGNOSIS — J301 Allergic rhinitis due to pollen: Secondary | ICD-10-CM | POA: Diagnosis not present

## 2022-09-05 DIAGNOSIS — J3081 Allergic rhinitis due to animal (cat) (dog) hair and dander: Secondary | ICD-10-CM | POA: Diagnosis not present

## 2022-09-05 DIAGNOSIS — J3089 Other allergic rhinitis: Secondary | ICD-10-CM | POA: Diagnosis not present

## 2022-09-12 DIAGNOSIS — J3089 Other allergic rhinitis: Secondary | ICD-10-CM | POA: Diagnosis not present

## 2022-09-12 DIAGNOSIS — J301 Allergic rhinitis due to pollen: Secondary | ICD-10-CM | POA: Diagnosis not present

## 2022-09-12 DIAGNOSIS — J3081 Allergic rhinitis due to animal (cat) (dog) hair and dander: Secondary | ICD-10-CM | POA: Diagnosis not present

## 2022-09-12 IMAGING — MR MR [PERSON_NAME] LOW W/O CM*L*
7 of 12 series · 25 of 40 positions shown · non-contrast
Comparison: MRI tibia/fibula 07/29/2020, CT tibia/fibula 05/11/2020

CLINICAL DATA: History of tibial stress fracture. Evaluate for
healing.

EXAM:
MRI OF LOWER LEFT EXTREMITY WITHOUT CONTRAST
TECHNIQUE: Multiplanar, multisequence MR imaging of the left lower leg was
performed. No intravenous contrast was administered.

[Series 6: T1 · axial · 5.0mm · 0.64mm/px · z∈[-148,+62]mm · 5 of 36 slices shown (1 of 5)]
[im 1/36]
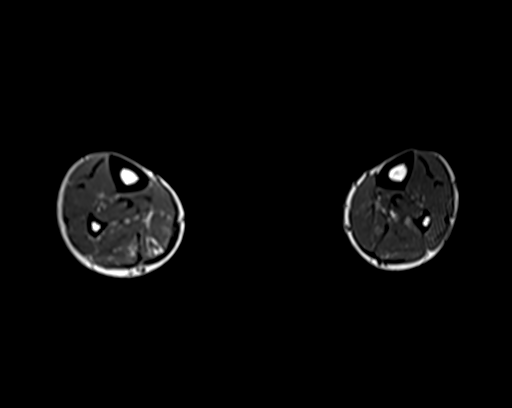
[im 9/36]
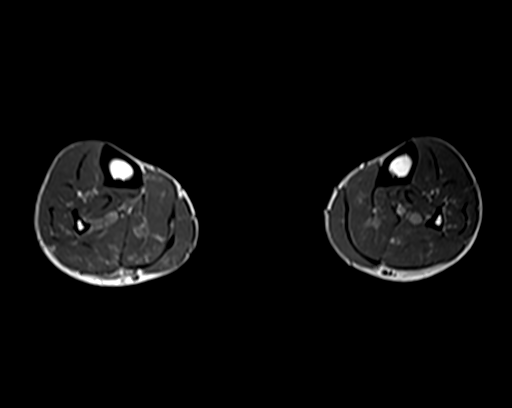
[im 18/36]
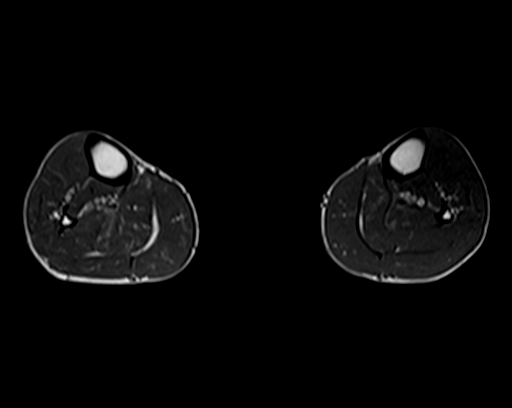
[im 27/36]
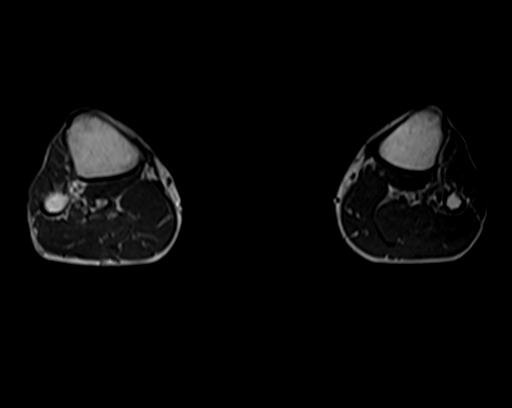
[im 36/36]
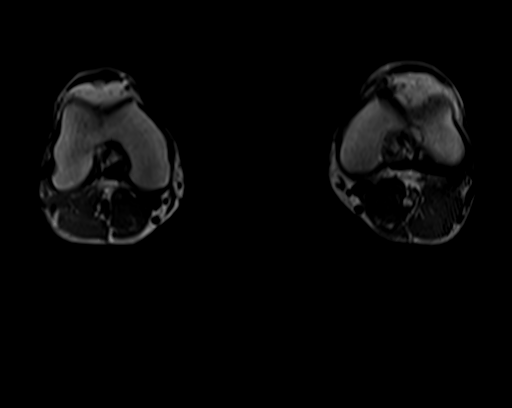

[Series 7: T2 fat-sat · axial · 5.0mm · 0.64mm/px · z∈[-148,+62]mm · 5 of 36 slices shown (1 of 2)]
[im 1/36]
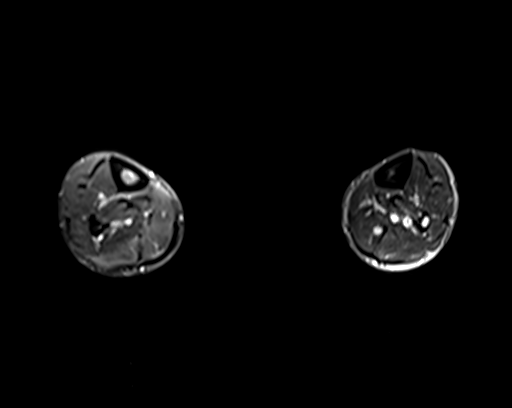
[im 9/36]
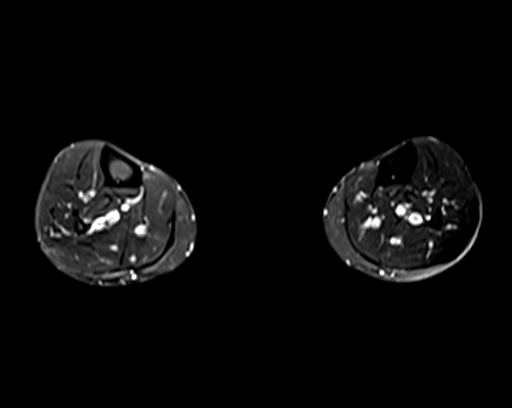
[im 18/36]
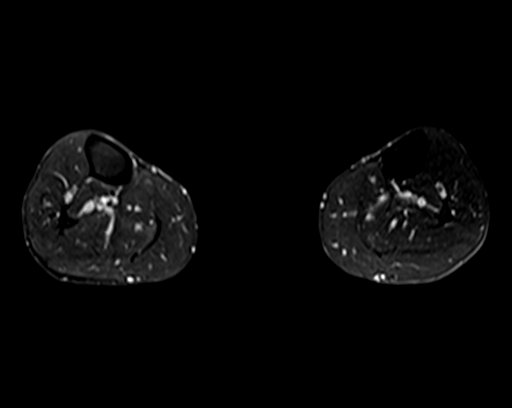
[im 27/36]
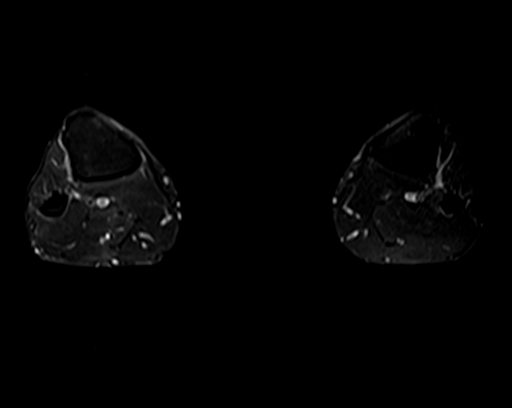
[im 36/36]
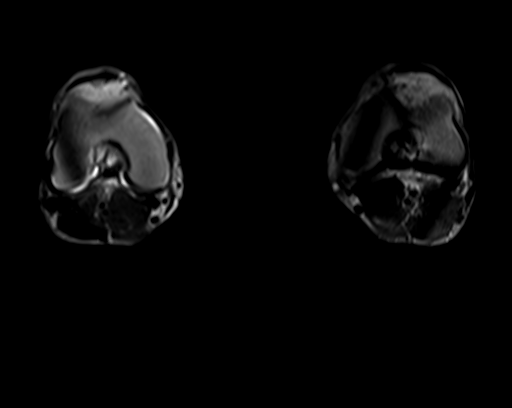

[Series 9: T1 · coronal · 4.0mm · 0.62mm/px · 3 of 26 slices shown (2 of 5)]
[im 1/26]
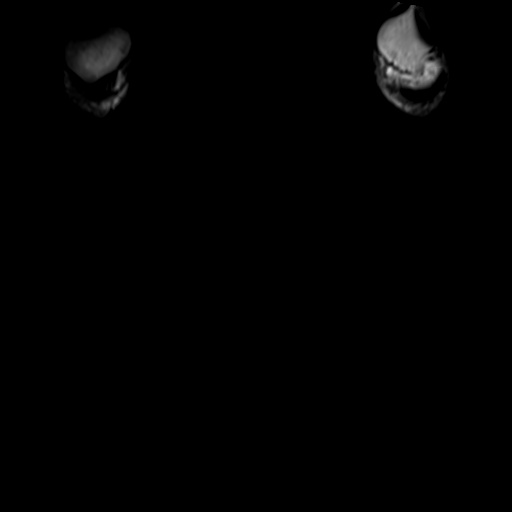
[im 13/26]
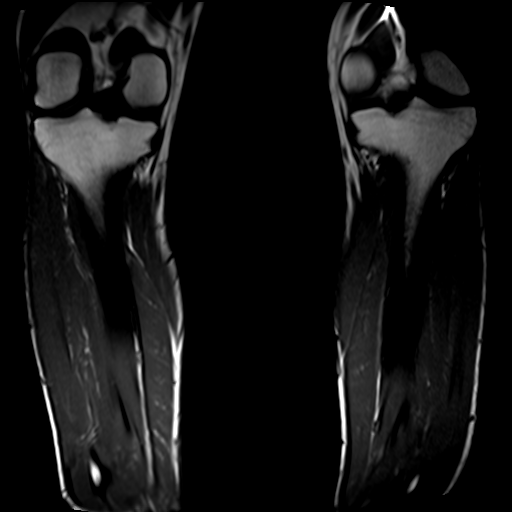
[im 26/26]
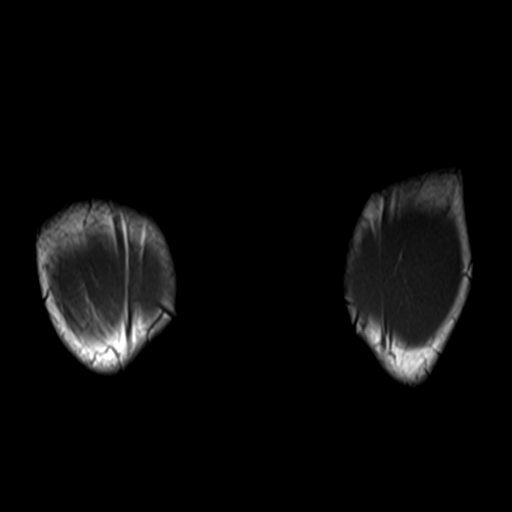

[Series 11: T1 · sagittal · 4.0mm · 0.81mm/px · 3 of 24 slices shown (3 of 5)]
[im 1/24]
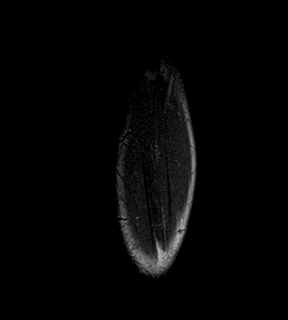
[im 12/24]
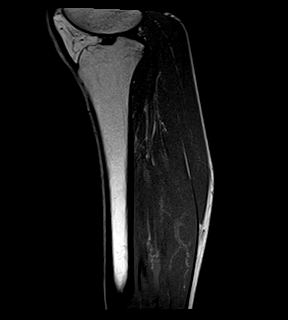
[im 24/24]
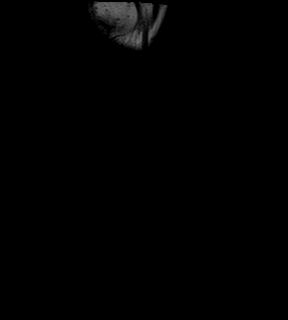

[Series 14: T1 · axial · 5.0mm · 0.55mm/px · z∈[-81,+105]mm · 4 of 32 slices shown (4 of 5)]
[im 1/32]
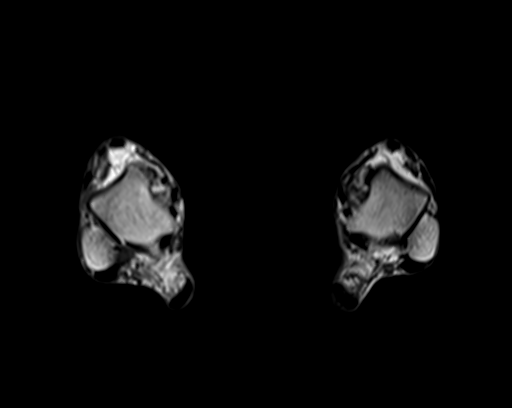
[im 11/32]
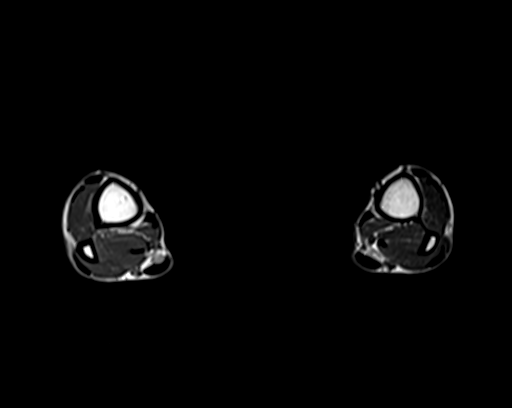
[im 21/32]
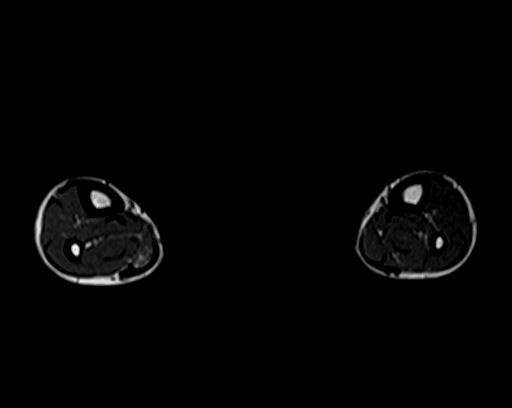
[im 32/32]
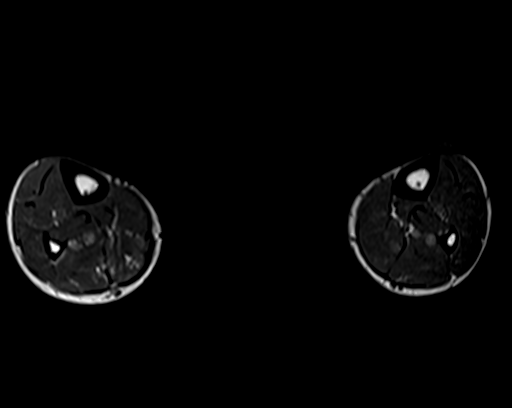

[Series 15: T2 fat-sat · axial · 5.0mm · 0.55mm/px · z∈[-81,+105]mm · 4 of 32 slices shown (2 of 2)]
[im 1/32]
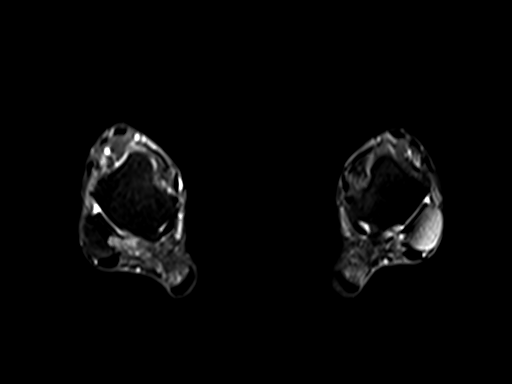
[im 11/32]
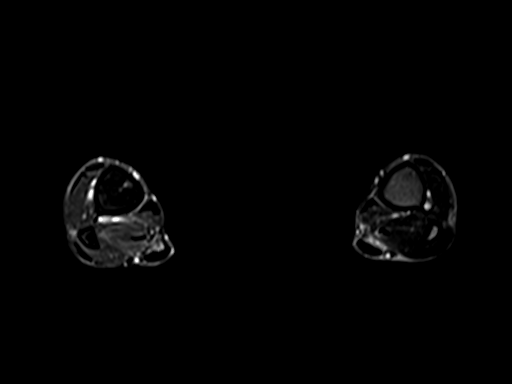
[im 21/32]
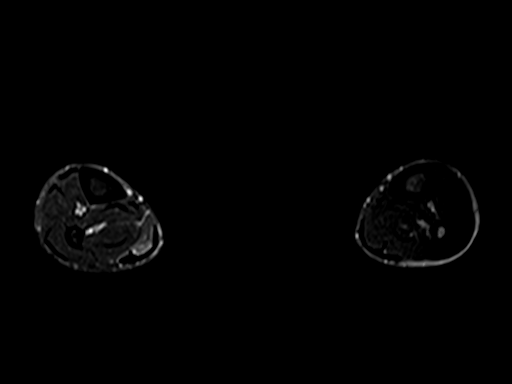
[im 32/32]
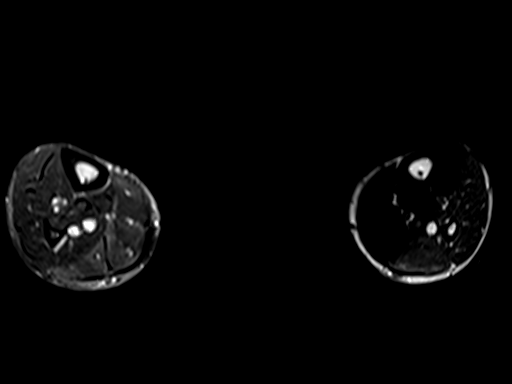

[Series 17: T1 · coronal · 4.0mm · 0.57mm/px · 1 of 20 slices shown (5 of 5)]
[im 1/20]
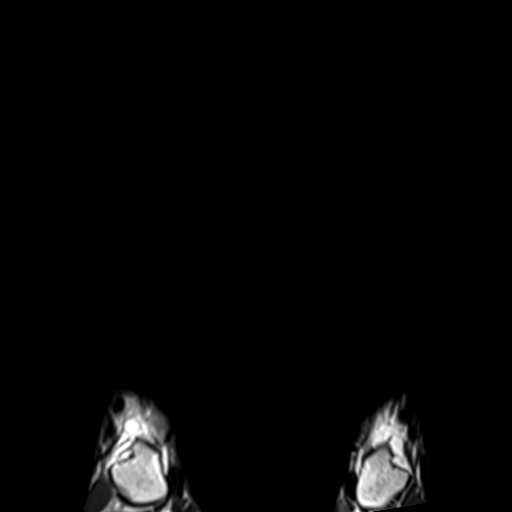

[25 of 40 positions shown; findings below may reference images not displayed]

FINDINGS: Bones/Joint/Cartilage

No fracture or dislocation. Normal alignment. No joint effusion. No
marrow signal abnormality.

Ligaments

Collateral ligaments are intact.

Muscles and Tendons

Muscles are normal. No muscle edema or atrophy.

Soft tissue
No fluid collection or hematoma. No soft tissue mass.
IMPRESSION: 1. No acute injury of the left lower leg.
2. No evidence of a left lower leg stress fracture or stress
reaction.

## 2022-09-17 DIAGNOSIS — J3089 Other allergic rhinitis: Secondary | ICD-10-CM | POA: Diagnosis not present

## 2022-09-17 DIAGNOSIS — J3081 Allergic rhinitis due to animal (cat) (dog) hair and dander: Secondary | ICD-10-CM | POA: Diagnosis not present

## 2022-09-17 DIAGNOSIS — J301 Allergic rhinitis due to pollen: Secondary | ICD-10-CM | POA: Diagnosis not present

## 2022-09-20 ENCOUNTER — Other Ambulatory Visit: Payer: BC Managed Care – PPO

## 2022-09-30 ENCOUNTER — Other Ambulatory Visit (INDEPENDENT_AMBULATORY_CARE_PROVIDER_SITE_OTHER): Payer: BC Managed Care – PPO

## 2022-09-30 DIAGNOSIS — E039 Hypothyroidism, unspecified: Secondary | ICD-10-CM

## 2022-09-30 DIAGNOSIS — E230 Hypopituitarism: Secondary | ICD-10-CM | POA: Diagnosis not present

## 2022-09-30 LAB — TSH: TSH: 0.2 u[IU]/mL — ABNORMAL LOW (ref 0.35–5.50)

## 2022-09-30 LAB — CBC
HCT: 44.3 % (ref 39.0–52.0)
Hemoglobin: 14.8 g/dL (ref 13.0–17.0)
MCHC: 33.5 g/dL (ref 30.0–36.0)
MCV: 89.7 fl (ref 78.0–100.0)
Platelets: 267 10*3/uL (ref 150.0–400.0)
RBC: 4.94 Mil/uL (ref 4.22–5.81)
RDW: 13.5 % (ref 11.5–15.5)
WBC: 5.5 10*3/uL (ref 4.0–10.5)

## 2022-09-30 LAB — T4, FREE: Free T4: 1.22 ng/dL (ref 0.60–1.60)

## 2022-10-02 DIAGNOSIS — J3081 Allergic rhinitis due to animal (cat) (dog) hair and dander: Secondary | ICD-10-CM | POA: Diagnosis not present

## 2022-10-02 DIAGNOSIS — J301 Allergic rhinitis due to pollen: Secondary | ICD-10-CM | POA: Diagnosis not present

## 2022-10-02 DIAGNOSIS — J3089 Other allergic rhinitis: Secondary | ICD-10-CM | POA: Diagnosis not present

## 2022-10-08 DIAGNOSIS — J3089 Other allergic rhinitis: Secondary | ICD-10-CM | POA: Diagnosis not present

## 2022-10-08 DIAGNOSIS — J3081 Allergic rhinitis due to animal (cat) (dog) hair and dander: Secondary | ICD-10-CM | POA: Diagnosis not present

## 2022-10-08 DIAGNOSIS — J301 Allergic rhinitis due to pollen: Secondary | ICD-10-CM | POA: Diagnosis not present

## 2022-10-15 DIAGNOSIS — J301 Allergic rhinitis due to pollen: Secondary | ICD-10-CM | POA: Diagnosis not present

## 2022-10-15 DIAGNOSIS — J3081 Allergic rhinitis due to animal (cat) (dog) hair and dander: Secondary | ICD-10-CM | POA: Diagnosis not present

## 2022-10-15 DIAGNOSIS — J3089 Other allergic rhinitis: Secondary | ICD-10-CM | POA: Diagnosis not present

## 2022-10-23 DIAGNOSIS — J3081 Allergic rhinitis due to animal (cat) (dog) hair and dander: Secondary | ICD-10-CM | POA: Diagnosis not present

## 2022-10-23 DIAGNOSIS — J301 Allergic rhinitis due to pollen: Secondary | ICD-10-CM | POA: Diagnosis not present

## 2022-10-23 DIAGNOSIS — J3089 Other allergic rhinitis: Secondary | ICD-10-CM | POA: Diagnosis not present

## 2022-10-31 DIAGNOSIS — J301 Allergic rhinitis due to pollen: Secondary | ICD-10-CM | POA: Diagnosis not present

## 2022-10-31 DIAGNOSIS — J3089 Other allergic rhinitis: Secondary | ICD-10-CM | POA: Diagnosis not present

## 2022-10-31 DIAGNOSIS — J3081 Allergic rhinitis due to animal (cat) (dog) hair and dander: Secondary | ICD-10-CM | POA: Diagnosis not present

## 2022-11-01 ENCOUNTER — Other Ambulatory Visit: Payer: Self-pay | Admitting: Endocrinology

## 2022-11-01 ENCOUNTER — Other Ambulatory Visit: Payer: Self-pay | Admitting: Internal Medicine

## 2022-11-01 DIAGNOSIS — J3081 Allergic rhinitis due to animal (cat) (dog) hair and dander: Secondary | ICD-10-CM | POA: Diagnosis not present

## 2022-11-08 DIAGNOSIS — J3081 Allergic rhinitis due to animal (cat) (dog) hair and dander: Secondary | ICD-10-CM | POA: Diagnosis not present

## 2022-11-08 DIAGNOSIS — J301 Allergic rhinitis due to pollen: Secondary | ICD-10-CM | POA: Diagnosis not present

## 2022-11-08 DIAGNOSIS — J3089 Other allergic rhinitis: Secondary | ICD-10-CM | POA: Diagnosis not present

## 2022-11-14 DIAGNOSIS — J3089 Other allergic rhinitis: Secondary | ICD-10-CM | POA: Diagnosis not present

## 2022-11-14 DIAGNOSIS — J301 Allergic rhinitis due to pollen: Secondary | ICD-10-CM | POA: Diagnosis not present

## 2022-11-14 DIAGNOSIS — J3081 Allergic rhinitis due to animal (cat) (dog) hair and dander: Secondary | ICD-10-CM | POA: Diagnosis not present

## 2022-11-18 ENCOUNTER — Ambulatory Visit (INDEPENDENT_AMBULATORY_CARE_PROVIDER_SITE_OTHER): Payer: BC Managed Care – PPO | Admitting: Family Medicine

## 2022-11-18 ENCOUNTER — Other Ambulatory Visit: Payer: Self-pay

## 2022-11-18 VITALS — BP 118/72 | Ht 70.0 in | Wt 147.0 lb

## 2022-11-18 DIAGNOSIS — S76312A Strain of muscle, fascia and tendon of the posterior muscle group at thigh level, left thigh, initial encounter: Secondary | ICD-10-CM

## 2022-11-18 NOTE — Patient Instructions (Signed)
You have a grade 1 strain of your left hamstring.  The right calf looks ok - likely from compensation. Do home askling rehab protocol once a day. Avoid hills, stairs as much as possible. Don't push speed or distance for the next couple weeks at least. Cross train with cycling, swimming. Follow up with me in 4 weeks for reevaluation.

## 2022-11-19 ENCOUNTER — Encounter: Payer: Self-pay | Admitting: Family Medicine

## 2022-11-19 NOTE — Progress Notes (Signed)
PCP: Corwin Levins, MD  Subjective:   HPI: Patient is a 67 y.o. male here for left hamstring pain.  Patient reports he started to get left hamstring pain in mid-June with soreness after running. Past week a couple days he's done 4-5 miles running, biking. Is now down to run/walk intervals due to discomfort in the hamstring. No swelling, bruising. Yesterday felt good so planned to run 14 miles but had to slow down after 7 miles due to discomfort. Also noticed in past month some pressure medial mid-calf area. He has been doing some home exercises for his hamstring.  Past Medical History:  Diagnosis Date   ALLERGIC REACTION 07/22/2009   ALLERGIC RHINITIS 10/08/2006   B12 DEFICIENCY 05/14/2010   Carpal tunnel syndrome 12/31/2007   Degeneration of cervical intervertebral disc 12/31/2007   HYPERLIPIDEMIA 10/08/2006   Hypogonadism in male 06/26/2009   Qualifier: Diagnosis of  By: Lovell Sheehan MD, John E    HYPOTHYROIDISM 10/08/2006   Irritable bowel syndrome 03/03/2008   LUMBAR RADICULOPATHY, RIGHT 12/31/2006   OSTEOARTHRITIS, LOWER LEG, LEFT 05/10/2009   SINUSITIS - ACUTE-NOS 07/22/2009   TESTICULAR HYPOFUNCTION 06/26/2009    Current Outpatient Medications on File Prior to Visit  Medication Sig Dispense Refill   EPINEPHrine 0.3 mg/0.3 mL IJ SOAJ injection EpiPen 2-Pak 0.3 mg/0.3 mL injection, auto-injector     JATENZO 198 MG CAPS 1 CAPSULE WITH BREAKFAST AND 1 CAPSULE WITH DINNER DAILY 180 capsule 5   levothyroxine (SYNTHROID) 137 MCG tablet Take 1 tablet (137 mcg total) by mouth daily before breakfast. 90 tablet 1   levothyroxine (SYNTHROID) 150 MCG tablet TAKE 1 TABLET BY MOUTH 30 MINUTES BEFORE BREAKFAST EVERY DAY. 90 tablet 1   montelukast (SINGULAIR) 10 MG tablet Take 10 mg by mouth at bedtime.     Multiple Vitamin (MULTIVITAMIN) capsule Take 1 capsule by mouth daily.     tadalafil (CIALIS) 5 MG tablet Take 1 tablet (5 mg total) by mouth daily. 90 tablet 3   Triamcinolone Acetonide (NASACORT  ALLERGY 24HR NA) Place into the nose.     No current facility-administered medications on file prior to visit.    Past Surgical History:  Procedure Laterality Date   BACK SURGERY     I & D EXTREMITY Right 05/24/2020   Procedure: IRRIGATION AND DEBRIDEMENT RIGHT THUMB;  Surgeon: Tarry Kos, MD;  Location: [facility];  Service: Orthopedics;  Laterality: Right;   KNEE SURGERY     LUMBAR LAMINECTOMY  2008   with revision   SPINAL FUSION  2011   spinal fusion revision  2012    Allergies  Allergen Reactions   Iodine     IVP dye    Levofloxacin     BP 118/72   Ht 5\' 10"  (1.778 m)   Wt 147 lb (66.7 kg)   BMI 21.09 kg/m      12/22/2019   11:13 AM 01/31/2020    3:41 PM 02/14/2020    3:35 PM 03/15/2020    3:51 PM 02/28/2021    1:24 PM 04/02/2021    4:26 PM 06/25/2021    8:55 AM  [facility]  Frequency of aerobic exercise (# of days/week) 7 7 6 6 6 6 4   Average time in minutes 90 90 75 75 80 80 90  Frequency of strengthening activities (# of days/week) 3 1 0 0 1 1 2         No data to display  Objective:  Physical Exam:  Gen: NAD, comfortable in exam room  Left leg: No deformity, swelling, bruising. FROM with 5/5 strength knee flexion at 30 and 90 degrees though some discomfort at 30 degrees. Tenderness to palpation midportion medial hamstring. NVI distally.  Right lower leg: No deformity. FROM with 5/5 strength ankle No tenderness to palpation over tibia.  Tenderness medial gastroc. NVI distally. Negative hop test.   Limited MSK u/s:  Left medial hamstrings with hyperechoic change in area of pain - no visible tear though compared to right.  Right tibia without cortical irregularity, edema overlying cortex, neovascularity.  Right medial gastroc appears normal. Assessment & Plan:  1. Left hamstring strain - grade 1.  Ultrasound otherwise reassuring.  Home exercise program reviewed.  Compression sleeve.  Icing  if needed.  Tylenol or aleve if needed.  Cross training.  F/u in 4 weeks.  2. Right calf pain - appears normal.  Likely related to compensation.

## 2022-11-22 DIAGNOSIS — H43821 Vitreomacular adhesion, right eye: Secondary | ICD-10-CM | POA: Diagnosis not present

## 2022-11-22 DIAGNOSIS — H348122 Central retinal vein occlusion, left eye, stable: Secondary | ICD-10-CM | POA: Diagnosis not present

## 2022-11-22 DIAGNOSIS — H43812 Vitreous degeneration, left eye: Secondary | ICD-10-CM | POA: Diagnosis not present

## 2022-11-22 DIAGNOSIS — J301 Allergic rhinitis due to pollen: Secondary | ICD-10-CM | POA: Diagnosis not present

## 2022-11-22 DIAGNOSIS — J3089 Other allergic rhinitis: Secondary | ICD-10-CM | POA: Diagnosis not present

## 2022-11-22 DIAGNOSIS — J3081 Allergic rhinitis due to animal (cat) (dog) hair and dander: Secondary | ICD-10-CM | POA: Diagnosis not present

## 2022-11-22 DIAGNOSIS — H2513 Age-related nuclear cataract, bilateral: Secondary | ICD-10-CM | POA: Diagnosis not present

## 2022-11-27 ENCOUNTER — Ambulatory Visit: Payer: BC Managed Care – PPO | Admitting: Family Medicine

## 2022-11-28 DIAGNOSIS — J3081 Allergic rhinitis due to animal (cat) (dog) hair and dander: Secondary | ICD-10-CM | POA: Diagnosis not present

## 2022-11-28 DIAGNOSIS — J301 Allergic rhinitis due to pollen: Secondary | ICD-10-CM | POA: Diagnosis not present

## 2022-11-28 DIAGNOSIS — J3089 Other allergic rhinitis: Secondary | ICD-10-CM | POA: Diagnosis not present

## 2022-12-05 DIAGNOSIS — J3089 Other allergic rhinitis: Secondary | ICD-10-CM | POA: Diagnosis not present

## 2022-12-05 DIAGNOSIS — J301 Allergic rhinitis due to pollen: Secondary | ICD-10-CM | POA: Diagnosis not present

## 2022-12-09 ENCOUNTER — Encounter: Payer: Self-pay | Admitting: Endocrinology

## 2022-12-13 ENCOUNTER — Other Ambulatory Visit: Payer: Self-pay

## 2022-12-13 DIAGNOSIS — M79661 Pain in right lower leg: Secondary | ICD-10-CM

## 2022-12-13 DIAGNOSIS — S76312A Strain of muscle, fascia and tendon of the posterior muscle group at thigh level, left thigh, initial encounter: Secondary | ICD-10-CM

## 2022-12-13 NOTE — Progress Notes (Signed)
Pt is asking a PT referral for L hamstring/Rt calf pain.  Fax to [facility] [phone]  He has appt on 9/3 for PT.  Order faxed.

## 2022-12-18 ENCOUNTER — Ambulatory Visit (INDEPENDENT_AMBULATORY_CARE_PROVIDER_SITE_OTHER): Payer: Medicare Other | Admitting: Family Medicine

## 2022-12-18 ENCOUNTER — Encounter: Payer: Self-pay | Admitting: Family Medicine

## 2022-12-18 VITALS — BP 124/74 | Ht 70.0 in | Wt 147.0 lb

## 2022-12-18 DIAGNOSIS — M79661 Pain in right lower leg: Secondary | ICD-10-CM

## 2022-12-18 DIAGNOSIS — S76312D Strain of muscle, fascia and tendon of the posterior muscle group at thigh level, left thigh, subsequent encounter: Secondary | ICD-10-CM

## 2022-12-18 DIAGNOSIS — J3089 Other allergic rhinitis: Secondary | ICD-10-CM | POA: Diagnosis not present

## 2022-12-18 DIAGNOSIS — J301 Allergic rhinitis due to pollen: Secondary | ICD-10-CM | POA: Diagnosis not present

## 2022-12-20 ENCOUNTER — Encounter: Payer: Self-pay | Admitting: Family Medicine

## 2022-12-20 NOTE — Progress Notes (Signed)
PCP: Corwin Levins, MD  Subjective:   HPI: Patient is a 67 y.o. male here for left hamstring pain.  8/5: Patient reports he started to get left hamstring pain in mid-June with soreness after running. Past week a couple days he's done 4-5 miles running, biking. Is now down to run/walk intervals due to discomfort in the hamstring. No swelling, bruising. Yesterday felt good so planned to run 14 miles but had to slow down after 7 miles due to discomfort. Also noticed in past month some pressure medial mid-calf area. He has been doing some home exercises for his hamstring.  9/4: Patient reports his left hamstring is improving well. Able to do 40 lb curls now on this side without a problem. His right calf feels worse however. He's just started physical therapy for this and only had 1 visit to date - done dry needling, scraping. Using a compression calf sleeve and doing exercises. Still able to run but at a much slower pace. No acute injuries.  Past Medical History:  Diagnosis Date   ALLERGIC REACTION 07/22/2009   ALLERGIC RHINITIS 10/08/2006   B12 DEFICIENCY 05/14/2010   Carpal tunnel syndrome 12/31/2007   Degeneration of cervical intervertebral disc 12/31/2007   HYPERLIPIDEMIA 10/08/2006   Hypogonadism in male 06/26/2009   Qualifier: Diagnosis of  By: Lovell Sheehan MD, John E    HYPOTHYROIDISM 10/08/2006   Irritable bowel syndrome 03/03/2008   LUMBAR RADICULOPATHY, RIGHT 12/31/2006   OSTEOARTHRITIS, LOWER LEG, LEFT 05/10/2009   SINUSITIS - ACUTE-NOS 07/22/2009   TESTICULAR HYPOFUNCTION 06/26/2009    Current Outpatient Medications on File Prior to Visit  Medication Sig Dispense Refill   EPINEPHrine 0.3 mg/0.3 mL IJ SOAJ injection EpiPen 2-Pak 0.3 mg/0.3 mL injection, auto-injector     JATENZO 198 MG CAPS 1 CAPSULE WITH BREAKFAST AND 1 CAPSULE WITH DINNER DAILY 180 capsule 5   levothyroxine (SYNTHROID) 137 MCG tablet Take 1 tablet (137 mcg total) by mouth daily before breakfast. 90 tablet 1    levothyroxine (SYNTHROID) 150 MCG tablet TAKE 1 TABLET BY MOUTH 30 MINUTES BEFORE BREAKFAST EVERY DAY. 90 tablet 1   montelukast (SINGULAIR) 10 MG tablet Take 10 mg by mouth at bedtime.     Multiple Vitamin (MULTIVITAMIN) capsule Take 1 capsule by mouth daily.     tadalafil (CIALIS) 5 MG tablet Take 1 tablet (5 mg total) by mouth daily. 90 tablet 3   Triamcinolone Acetonide (NASACORT ALLERGY 24HR NA) Place into the nose.     No current facility-administered medications on file prior to visit.    Past Surgical History:  Procedure Laterality Date   BACK SURGERY     I & D EXTREMITY Right 05/24/2020   Procedure: IRRIGATION AND DEBRIDEMENT RIGHT THUMB;  Surgeon: Tarry Kos, MD;  Location: [facility];  Service: Orthopedics;  Laterality: Right;   KNEE SURGERY     LUMBAR LAMINECTOMY  2008   with revision   SPINAL FUSION  2011   spinal fusion revision  2012    Allergies  Allergen Reactions   Iodine     IVP dye    Levofloxacin     BP 124/74   Ht 5\' 10"  (1.778 m)   Wt 147 lb (66.7 kg)   BMI 21.09 kg/m      12/22/2019   11:13 AM 01/31/2020    3:41 PM 02/14/2020    3:35 PM 03/15/2020    3:51 PM 02/28/2021    1:24 PM 04/02/2021    4:26 PM 06/25/2021  8:55 AM  [facility]  Frequency of aerobic exercise (# of days/week) 7 7 6 6 6 6 4   Average time in minutes 90 90 75 75 80 80 90  Frequency of strengthening activities (# of days/week) 3 1 0 0 1 1 2         No data to display              Objective:  Physical Exam:  Gen: NAD, comfortable in exam room  Left leg: No deformity. FROM with 5/5 strength. No tenderness to palpation. NVI distally.  Right lower leg: Visible fasciculations of gastroc.  No swelling.  Some redness in area where scraping performed.  No bruising. FROM with 5/5 strength ankle. Notable tenderness medial gastroc. NVI distally.   Assessment & Plan:  1. Left hamstring strain - grade 1, improving.  Continue  progression of his home exercises.  Compression sleeve, icing, tylenol or aleve if needed.    2. Right calf pain - notable fasciculations on exam now and tenderness medial gastroc though had dry needling and scraping performed on this recently. Does not feel like his prior stress fractures and no tenderness tibia.  He will continue with physical therapy and home exercises, nitroglycerin patches but if not improving over next 2-3 weeks we will proceed with MRI to assess for posterior tibial stress fracture.

## 2022-12-24 ENCOUNTER — Telehealth: Payer: Self-pay | Admitting: Endocrinology

## 2022-12-24 NOTE — Telephone Encounter (Signed)
MEDICATION:  Jatenzo JATENZO 198 MG CAPS  PHARMACY:    [id number] - [place], [place] - [address] (Ph: [phone])    HAS THE PATIENT CONTACTED THEIR PHARMACY?  Yes  IS THIS A 90 DAY SUPPLY : Yes  IS PATIENT OUT OF MEDICATION: No  IF NOT; HOW MUCH IS LEFT: Not  LAST APPOINTMENT DATE: @4 /26/2024  NEXT APPOINTMENT DATE:@10 /21/2024  DO WE HAVE YOUR PERMISSION TO LEAVE A DETAILED MESSAGE?: Yes  OTHER COMMENTS: Patient states that he has new insurance and would like prescription for  Jatenzo run through [employer] to see how much it will cost & what they will cover.  Patient would like a phone call in return once this is done.   **Let patient know to contact pharmacy at the end of the day to make sure medication is ready. **  ** Please notify patient to allow 48-72 hours to process**  **Encourage patient to contact the pharmacy for refills or they can request refills through [facility]**

## 2022-12-25 MED ORDER — JATENZO 198 MG PO CAPS: ORAL_CAPSULE | ORAL | 0 refills | Status: DC

## 2022-12-25 NOTE — Addendum Note (Signed)
Addended by: Timohty Renbarger, [place] on: 12/25/2022 03:54 PM   Modules accepted: Orders

## 2022-12-25 NOTE — Telephone Encounter (Signed)
I sent prescription.

## 2022-12-30 ENCOUNTER — Ambulatory Visit (INDEPENDENT_AMBULATORY_CARE_PROVIDER_SITE_OTHER): Payer: Self-pay | Admitting: Family Medicine

## 2022-12-30 DIAGNOSIS — S76312D Strain of muscle, fascia and tendon of the posterior muscle group at thigh level, left thigh, subsequent encounter: Secondary | ICD-10-CM

## 2022-12-30 DIAGNOSIS — M79661 Pain in right lower leg: Secondary | ICD-10-CM

## 2022-12-31 ENCOUNTER — Encounter: Payer: Self-pay | Admitting: Family Medicine

## 2022-12-31 NOTE — Progress Notes (Signed)
PCP: Corwin Levins, MD  Subjective:   HPI: Patient is a 67 y.o. male here for shockwave therapy.  Patient here for shockwave therapy to right calf and left hamstring. He is doing physical therapy for this as well. Still able to run but not at typical pace (see previous note).  Past Medical History:  Diagnosis Date   ALLERGIC REACTION 07/22/2009   ALLERGIC RHINITIS 10/08/2006   B12 DEFICIENCY 05/14/2010   Carpal tunnel syndrome 12/31/2007   Degeneration of cervical intervertebral disc 12/31/2007   HYPERLIPIDEMIA 10/08/2006   Hypogonadism in male 06/26/2009   Qualifier: Diagnosis of  By: Lovell Sheehan MD, John E    HYPOTHYROIDISM 10/08/2006   Irritable bowel syndrome 03/03/2008   LUMBAR RADICULOPATHY, RIGHT 12/31/2006   OSTEOARTHRITIS, LOWER LEG, LEFT 05/10/2009   SINUSITIS - ACUTE-NOS 07/22/2009   TESTICULAR HYPOFUNCTION 06/26/2009    Current Outpatient Medications on File Prior to Visit  Medication Sig Dispense Refill   EPINEPHrine 0.3 mg/0.3 mL IJ SOAJ injection EpiPen 2-Pak 0.3 mg/0.3 mL injection, auto-injector     JATENZO 198 MG CAPS 1 CAPSULE WITH BREAKFAST AND 1 CAPSULE WITH DINNER DAILY 180 capsule 0   levothyroxine (SYNTHROID) 137 MCG tablet Take 1 tablet (137 mcg total) by mouth daily before breakfast. 90 tablet 1   levothyroxine (SYNTHROID) 150 MCG tablet TAKE 1 TABLET BY MOUTH 30 MINUTES BEFORE BREAKFAST EVERY DAY. 90 tablet 1   montelukast (SINGULAIR) 10 MG tablet Take 10 mg by mouth at bedtime.     Multiple Vitamin (MULTIVITAMIN) capsule Take 1 capsule by mouth daily.     tadalafil (CIALIS) 5 MG tablet Take 1 tablet (5 mg total) by mouth daily. 90 tablet 3   Triamcinolone Acetonide (NASACORT ALLERGY 24HR NA) Place into the nose.     No current facility-administered medications on file prior to visit.    Past Surgical History:  Procedure Laterality Date   BACK SURGERY     I & D EXTREMITY Right 05/24/2020   Procedure: IRRIGATION AND DEBRIDEMENT RIGHT THUMB;  Surgeon: Tarry Kos, MD;  Location: [facility];  Service: Orthopedics;  Laterality: Right;   KNEE SURGERY     LUMBAR LAMINECTOMY  2008   with revision   SPINAL FUSION  2011   spinal fusion revision  2012    Allergies  Allergen Reactions   Iodine     IVP dye    Levofloxacin     There were no vitals taken for this visit.     12/22/2019   11:13 AM 01/31/2020    3:41 PM 02/14/2020    3:35 PM 03/15/2020    3:51 PM 02/28/2021    1:24 PM 04/02/2021    4:26 PM 06/25/2021    8:55 AM  [facility]  Frequency of aerobic exercise (# of days/week) 7 7 6 6 6 6 4   Average time in minutes 90 90 75 75 80 80 90  Frequency of strengthening activities (# of days/week) 3 1 0 0 1 1 2         No data to display              Objective:  Physical Exam:  Gen: NAD, comfortable in exam room   Assessment & Plan:  1. Left hamstring strain, right calf strain - shockwave first treatment done today as below.  F/u in 1 week.  Procedure: ECSWT Indications:  right medial gastroc strain   Procedure Details Consent: Risks of procedure as well as  the alternatives and risks of each were explained to the patient.  Written consent for procedure obtained. Time Out: Verified patient identification, verified procedure, site was marked, verified correct patient position, medications/allergies/relevent history reviewed.  The area was cleaned with alcohol swab.     The right medial gastroc was targeted for Extracorporeal shockwave therapy.    Preset: status post muscle injury Power Level: 90 Frequency: 14 Impulse/cycles: 3000 Head size: large   Patient tolerated procedure well without immediate complications   Procedure: ECSWT Indications:  left hamstring strain   Procedure Details Consent: Risks of procedure as well as the alternatives and risks of each were explained to the patient.  Written consent for procedure obtained. Time Out: Verified patient identification, verified  procedure, site was marked, verified correct patient position, medications/allergies/relevent history reviewed.  The area was cleaned with alcohol swab.     The left hamstrings were targeted for Extracorporeal shockwave therapy.    Preset: status post muscle injury Power Level: 100 Frequency: 14 Impulse/cycles: 3000 Head size: large   Patient tolerated procedure well without immediate complications

## 2023-01-01 ENCOUNTER — Encounter: Payer: Self-pay | Admitting: Internal Medicine

## 2023-01-01 ENCOUNTER — Ambulatory Visit: Payer: Medicare Other | Admitting: Internal Medicine

## 2023-01-01 VITALS — BP 134/78 | HR 60 | Temp 97.9°F | Ht 70.0 in | Wt 153.0 lb

## 2023-01-01 DIAGNOSIS — J019 Acute sinusitis, unspecified: Secondary | ICD-10-CM | POA: Diagnosis not present

## 2023-01-01 DIAGNOSIS — E039 Hypothyroidism, unspecified: Secondary | ICD-10-CM

## 2023-01-01 DIAGNOSIS — E7800 Pure hypercholesterolemia, unspecified: Secondary | ICD-10-CM

## 2023-01-01 DIAGNOSIS — E291 Testicular hypofunction: Secondary | ICD-10-CM | POA: Diagnosis not present

## 2023-01-01 DIAGNOSIS — R739 Hyperglycemia, unspecified: Secondary | ICD-10-CM | POA: Diagnosis not present

## 2023-01-01 DIAGNOSIS — J309 Allergic rhinitis, unspecified: Secondary | ICD-10-CM | POA: Diagnosis not present

## 2023-01-01 LAB — CBC WITH DIFFERENTIAL/PLATELET
Basophils Absolute: 0.1 10*3/uL (ref 0.0–0.1)
Basophils Relative: 1 % (ref 0.0–3.0)
Eosinophils Absolute: 0.5 10*3/uL (ref 0.0–0.7)
Eosinophils Relative: 8.1 % — ABNORMAL HIGH (ref 0.0–5.0)
HCT: 46.5 % (ref 39.0–52.0)
Hemoglobin: 15.4 g/dL (ref 13.0–17.0)
Lymphocytes Relative: 16.9 % (ref 12.0–46.0)
Lymphs Abs: 1.1 10*3/uL (ref 0.7–4.0)
MCHC: 33.2 g/dL (ref 30.0–36.0)
MCV: 90.7 fl (ref 78.0–100.0)
Monocytes Absolute: 0.9 10*3/uL (ref 0.1–1.0)
Monocytes Relative: 13.6 % — ABNORMAL HIGH (ref 3.0–12.0)
Neutro Abs: 4 10*3/uL (ref 1.4–7.7)
Neutrophils Relative %: 60.4 % (ref 43.0–77.0)
Platelets: 265 10*3/uL (ref 150.0–400.0)
RBC: 5.13 Mil/uL (ref 4.22–5.81)
RDW: 13.6 % (ref 11.5–15.5)
WBC: 6.7 10*3/uL (ref 4.0–10.5)

## 2023-01-01 LAB — BASIC METABOLIC PANEL WITH GFR
BUN: 26 mg/dL — ABNORMAL HIGH (ref 6–23)
CO2: 35 meq/L — ABNORMAL HIGH (ref 19–32)
Calcium: 9.3 mg/dL (ref 8.4–10.5)
Chloride: 100 meq/L (ref 96–112)
Creatinine, Ser: 1.12 mg/dL (ref 0.40–1.50)
GFR: 69.2 mL/min (ref >60.00)
Glucose, Bld: 89 mg/dL (ref 70–99)
Potassium: 4.5 meq/L (ref 3.5–5.1)
Sodium: 139 meq/L (ref 135–145)

## 2023-01-01 LAB — URINALYSIS, ROUTINE W REFLEX MICROSCOPIC
Bilirubin Urine: NEGATIVE
Hgb urine dipstick: NEGATIVE
Ketones, ur: NEGATIVE
Leukocytes,Ua: NEGATIVE
Nitrite: NEGATIVE
RBC / HPF: NONE SEEN (ref 0–?)
Specific Gravity, Urine: 1.015 (ref 1.000–1.030)
Total Protein, Urine: NEGATIVE
Urine Glucose: NEGATIVE
Urobilinogen, UA: 0.2 (ref 0.0–1.0)
pH: 8 (ref 5.0–8.0)

## 2023-01-01 LAB — HEPATIC FUNCTION PANEL
ALT: 31 U/L (ref 0–53)
AST: 35 U/L (ref 0–37)
Albumin: 4.2 g/dL (ref 3.5–5.2)
Alkaline Phosphatase: 48 U/L (ref 39–117)
Bilirubin, Direct: 0.1 mg/dL (ref 0.0–0.3)
Total Bilirubin: 0.6 mg/dL (ref 0.2–1.2)
Total Protein: 6.7 g/dL (ref 6.0–8.3)

## 2023-01-01 LAB — LIPID PANEL
Cholesterol: 169 mg/dL (ref 0–200)
HDL: 45.7 mg/dL (ref >39.00)
LDL Cholesterol: 109 mg/dL — ABNORMAL HIGH (ref 0–99)
NonHDL: 122.81
Total CHOL/HDL Ratio: 4
Triglycerides: 68 mg/dL (ref 0.0–149.0)
VLDL: 13.6 mg/dL (ref 0.0–40.0)

## 2023-01-01 LAB — HEMOGLOBIN A1C: Hgb A1c MFr Bld: 5.4 % (ref 4.6–6.5)

## 2023-01-01 LAB — TESTOSTERONE: Testosterone: 340.76 ng/dL (ref 300.00–890.00)

## 2023-01-01 LAB — TSH: TSH: 0.37 u[IU]/mL (ref 0.35–5.50)

## 2023-01-01 LAB — T4, FREE: Free T4: 1.37 ng/dL (ref 0.60–1.60)

## 2023-01-01 MED ORDER — DOXYCYCLINE HYCLATE 100 MG PO TABS: 100.0000 mg | ORAL_TABLET | Freq: Two times a day (BID) | ORAL | 0 refills | Status: DC

## 2023-01-01 NOTE — Assessment & Plan Note (Signed)
With good stamina recently - for f/u lab today

## 2023-01-01 NOTE — Assessment & Plan Note (Signed)
Lab Results  Component Value Date   TSH 0.20 (L) 09/30/2022   Stable, pt to continue levothyroxine 137 mcg per day - for f/u lab

## 2023-01-01 NOTE — Patient Instructions (Signed)
Please take all new medication as prescribed - the antibiotic  Please continue all other medications as before, and refills have been done if requested.  Please have the pharmacy call with any other refills you may need.  Please keep your appointments with your specialists as you may have planned  Please go to the LAB at the blood drawing area for the tests to be done  You will be contacted by phone if any changes need to be made immediately.  Otherwise, you will receive a letter about your results with an explanation, but please check with MyChart first.

## 2023-01-01 NOTE — Assessment & Plan Note (Signed)
Mild ,for otc allegra prn, to f/u any worsening symptoms or concerns

## 2023-01-01 NOTE — Assessment & Plan Note (Signed)
Mild to mod, for antibx course doxycycline 100 bid,  to f/u any worsening symptoms or concerns

## 2023-01-01 NOTE — Progress Notes (Signed)
Patient ID: Joseph Santana, male   DOB: [birth year], 67 y.o.   MRN: [id number]        Chief Complaint: follow up allergies, low testosterone, and acute sinus infection, low thyroid infection       HPI:  [name] is a 67 y.o. male  Here with 2-3 days acute onset fever, facial pain, pressure, headache, general weakness and malaise, and greenish d/c, with mild ST and cough, but pt denies chest pain, wheezing, increased sob or doe, orthopnea, PND, increased LE swelling, palpitations, dizziness or syncope.   Pt denies polydipsia, polyuria, or new focal neuro s/s.    Pt denies recent wt loss, night sweats, loss of appetite, or other constitutional symptoms  Does have a mild hamstring injury as he is training for [place] in november       Wt Readings from Last 3 Encounters:  01/01/23 153 lb (69.4 kg)  12/18/22 147 lb (66.7 kg)  11/18/22 147 lb (66.7 kg)   BP Readings from Last 3 Encounters:  01/01/23 134/78  12/18/22 124/74  11/18/22 118/72         Past Medical History:  Diagnosis Date   ALLERGIC REACTION 07/22/2009   ALLERGIC RHINITIS 10/08/2006   B12 DEFICIENCY 05/14/2010   Carpal tunnel syndrome 12/31/2007   Degeneration of cervical intervertebral disc 12/31/2007   HYPERLIPIDEMIA 10/08/2006   Hypogonadism in male 06/26/2009   Qualifier: Diagnosis of  By: Lovell Sheehan MD, Natalya Domzalski E    HYPOTHYROIDISM 10/08/2006   Irritable bowel syndrome 03/03/2008   LUMBAR RADICULOPATHY, RIGHT 12/31/2006   OSTEOARTHRITIS, LOWER LEG, LEFT 05/10/2009   SINUSITIS - ACUTE-NOS 07/22/2009   TESTICULAR HYPOFUNCTION 06/26/2009   Past Surgical History:  Procedure Laterality Date   BACK SURGERY     I & D EXTREMITY Right 05/24/2020   Procedure: IRRIGATION AND DEBRIDEMENT RIGHT THUMB;  Surgeon: Tarry Kos, MD;  [facility];  Service: Orthopedics;  Laterality: Right;   KNEE SURGERY     LUMBAR LAMINECTOMY  2008   with revision   SPINAL FUSION  2011   spinal fusion revision  2012    reports  that he has never smoked. He has never used smokeless tobacco. He reports that he does not drink alcohol and does not use drugs. family history includes Allergy (severe) in an other family member; Asthma in an other family member; Diabetes in an other family member; Hypothyroidism in his brother, father, mother, and sister. Allergies  Allergen Reactions   Iodine     IVP dye    Levofloxacin    Current Outpatient Medications on File Prior to Visit  Medication Sig Dispense Refill   EPINEPHrine 0.3 mg/0.3 mL IJ SOAJ injection EpiPen 2-Pak 0.3 mg/0.3 mL injection, auto-injector     JATENZO 198 MG CAPS 1 CAPSULE WITH BREAKFAST AND 1 CAPSULE WITH DINNER DAILY 180 capsule 0   levothyroxine (SYNTHROID) 137 MCG tablet Take 1 tablet (137 mcg total) by mouth daily before breakfast. 90 tablet 1   montelukast (SINGULAIR) 10 MG tablet Take 10 mg by mouth at bedtime.     Multiple Vitamin (MULTIVITAMIN) capsule Take 1 capsule by mouth daily.     Triamcinolone Acetonide (NASACORT ALLERGY 24HR NA) Place into the nose.     No current facility-administered medications on file prior to visit.        ROS:  All others reviewed and negative.  Objective        PE:  BP 134/78 (BP Location: Right Arm,  Patient Position: Sitting, Cuff Size: Normal)   Pulse 60   Temp 97.9 F (36.6 C) (Oral)   Ht 5\' 10"  (1.778 m)   Wt 153 lb (69.4 kg)   SpO2 99%   BMI 21.95 kg/m                 Constitutional: Pt appears in NAD               HENT: Head: NCAT.                Right Ear: External ear normal.                 Left Ear: External ear normal. Bilat tm's with mild erythema.  Max sinus areas mild tender.  Pharynx with mild erythema, no exudate=               Eyes: . Pupils are equal, round, and reactive to light. Conjunctivae and EOM are normal               Nose: without d/c or deformity               Neck: Neck supple. Gross normal ROM               Cardiovascular: Normal rate and regular rhythm.                  Pulmonary/Chest: Effort normal and breath sounds without rales or wheezing.                Abd:  Soft, NT, ND, + BS, no organomegaly               Neurological: Pt is alert. At baseline orientation, motor grossly intact               Skin: Skin is warm. No rashes, no other new lesions, LE edema - none               Psychiatric: Pt behavior is normal without agitation   Micro: none  Cardiac tracings I have personally interpreted today:  none  Pertinent Radiological findings (summarize): none   Lab Results  Component Value Date   WBC 5.5 09/30/2022   HGB 14.8 09/30/2022   HCT 44.3 09/30/2022   PLT 267.0 09/30/2022   GLUCOSE 84 08/28/2022   CHOL 156 08/28/2022   TRIG 65.0 08/28/2022   HDL 44.70 08/28/2022   LDLDIRECT 146.2 11/19/2012   LDLCALC 98 08/28/2022   ALT 22 08/28/2022   AST 25 08/28/2022   NA 139 08/28/2022   K 4.9 08/28/2022   CL 99 08/28/2022   CREATININE 1.03 08/28/2022   BUN 21 08/28/2022   CO2 33 (H) 08/28/2022   TSH 0.20 (L) 09/30/2022   PSA 1.25 08/28/2022   HGBA1C 5.4 08/28/2022   Assessment/Plan:  Joseph Santana is a 67 y.o. White or Caucasian [1] male with  has a past medical history of ALLERGIC REACTION (07/22/2009), ALLERGIC RHINITIS (10/08/2006), B12 DEFICIENCY (05/14/2010), Carpal tunnel syndrome (12/31/2007), Degeneration of cervical intervertebral disc (12/31/2007), HYPERLIPIDEMIA (10/08/2006), Hypogonadism in male (06/26/2009), HYPOTHYROIDISM (10/08/2006), Irritable bowel syndrome (03/03/2008), LUMBAR RADICULOPATHY, RIGHT (12/31/2006), OSTEOARTHRITIS, LOWER LEG, LEFT (05/10/2009), SINUSITIS - ACUTE-NOS (07/22/2009), and TESTICULAR HYPOFUNCTION (06/26/2009).  Acute sinus infection Mild to mod, for antibx course  - doxycycline 100 bid,  to f/u any worsening symptoms or concerns  Hypothyroidism Lab Results  Component Value Date   TSH 0.20 (L) 09/30/2022   Stable, pt to continue  levothyroxine 137 mcg per day - for f/u lab   Hypogonadism in male With good  stamina recently - for f/u lab today  Allergic rhinitis Mild, for otc allegra prn, to f/u any worsening symptoms or concerns  Followup: Return in about 6 months (around 07/01/2023).  Oliver Barre, MD 01/01/2023 10:05 AM [facility]

## 2023-01-06 ENCOUNTER — Telehealth: Payer: Self-pay | Admitting: Endocrinology

## 2023-01-06 ENCOUNTER — Ambulatory Visit (INDEPENDENT_AMBULATORY_CARE_PROVIDER_SITE_OTHER): Payer: Self-pay | Admitting: Family Medicine

## 2023-01-06 DIAGNOSIS — M79661 Pain in right lower leg: Secondary | ICD-10-CM

## 2023-01-06 DIAGNOSIS — S76312D Strain of muscle, fascia and tendon of the posterior muscle group at thigh level, left thigh, subsequent encounter: Secondary | ICD-10-CM

## 2023-01-06 NOTE — Progress Notes (Signed)
PCP: Corwin Levins, MD  Subjective:   HPI: Patient is a 67 y.o. male here for shockwave therapy.  Patient here for second treatment to left hamstring and right calf. Being treated for sinus infection with antibiotics so hasn't been able to run as much though did get 2 runs in since last treatment.  Past Medical History:  Diagnosis Date   ALLERGIC REACTION 07/22/2009   ALLERGIC RHINITIS 10/08/2006   B12 DEFICIENCY 05/14/2010   Carpal tunnel syndrome 12/31/2007   Degeneration of cervical intervertebral disc 12/31/2007   HYPERLIPIDEMIA 10/08/2006   Hypogonadism in male 06/26/2009   Qualifier: Diagnosis of  By: Lovell Sheehan MD, John E    HYPOTHYROIDISM 10/08/2006   Irritable bowel syndrome 03/03/2008   LUMBAR RADICULOPATHY, RIGHT 12/31/2006   OSTEOARTHRITIS, LOWER LEG, LEFT 05/10/2009   SINUSITIS - ACUTE-NOS 07/22/2009   TESTICULAR HYPOFUNCTION 06/26/2009    Current Outpatient Medications on File Prior to Visit  Medication Sig Dispense Refill   doxycycline (VIBRA-TABS) 100 MG tablet Take 1 tablet (100 mg total) by mouth 2 (two) times daily. 20 tablet 0   EPINEPHrine 0.3 mg/0.3 mL IJ SOAJ injection EpiPen 2-Pak 0.3 mg/0.3 mL injection, auto-injector     JATENZO 198 MG CAPS 1 CAPSULE WITH BREAKFAST AND 1 CAPSULE WITH DINNER DAILY 180 capsule 0   levothyroxine (SYNTHROID) 137 MCG tablet Take 1 tablet (137 mcg total) by mouth daily before breakfast. 90 tablet 1   montelukast (SINGULAIR) 10 MG tablet Take 10 mg by mouth at bedtime.     Multiple Vitamin (MULTIVITAMIN) capsule Take 1 capsule by mouth daily.     Triamcinolone Acetonide (NASACORT ALLERGY 24HR NA) Place into the nose.     No current facility-administered medications on file prior to visit.    Past Surgical History:  Procedure Laterality Date   BACK SURGERY     I & D EXTREMITY Right 05/24/2020   Procedure: IRRIGATION AND DEBRIDEMENT RIGHT THUMB;  Surgeon: Tarry Kos, MD;  Location: [facility];  Service: Orthopedics;   Laterality: Right;   KNEE SURGERY     LUMBAR LAMINECTOMY  2008   with revision   SPINAL FUSION  2011   spinal fusion revision  2012    Allergies  Allergen Reactions   Iodine     IVP dye    Levofloxacin     There were no vitals taken for this visit.     12/22/2019   11:13 AM 01/31/2020    3:41 PM 02/14/2020    3:35 PM 03/15/2020    3:51 PM 02/28/2021    1:24 PM 04/02/2021    4:26 PM 06/25/2021    8:55 AM  [facility] Adult [facility]  Frequency of aerobic exercise (# of days/week) 7 7 6 6 6 6 4   Average time in minutes 90 90 75 75 80 80 90  Frequency of strengthening activities (# of days/week) 3 1 0 0 1 1 2         No data to display              Objective:  Physical Exam:  Gen: NAD, comfortable in exam room   Assessment & Plan:  1. Left hamstring strain, right calf strain - shockwave treatment #2 as below.  F/u in 1 week.  Anticipate 4-6 total treatments.  Procedure: ECSWT Indications:  left hamstring strain, subsequent encounrater   Procedure Details Consent: Risks of procedure as well as the alternatives and risks of each were explained to the patient.  Written consent for procedure obtained. Time Out: Verified patient identification, verified procedure, site was marked, verified correct patient position, medications/allergies/relevent history reviewed.  The area was cleaned with alcohol swab.     The left hamstring was targeted for Extracorporeal shockwave therapy.    Preset: status post muscle injury Power Level: 100 Frequency: 15 Impulse/cycles: 3000 Head size: large   Patient tolerated procedure well without immediate complications   Procedure: ECSWT Indications:  right calf strain   Procedure Details Consent: Risks of procedure as well as the alternatives and risks of each were explained to the patient.  Written consent for procedure obtained. Time Out: Verified patient identification, verified procedure, site was marked, verified  correct patient position, medications/allergies/relevent history reviewed.  The area was cleaned with alcohol swab.     The right medial gastroc was targeted for Extracorporeal shockwave therapy.    Preset: status post muscle injury Power Level: 100 Frequency: 15 Impulse/cycles: 3000 Head size: large   Patient tolerated procedure well without immediate complications

## 2023-01-06 NOTE — Telephone Encounter (Signed)
MEDICATION: Jatenzo JATENZO 198 MG CAPS  PHARMACY:    [id number] - [place], [place] - [address] (Ph: [phone])    HAS THE PATIENT CONTACTED THEIR PHARMACY?  Yes  IS THIS A 90 DAY SUPPLY : Yes  IS PATIENT OUT OF MEDICATION: No  IF NOT; HOW MUCH IS LEFT: 3 weeks  LAST APPOINTMENT DATE: @4 /26/2024  NEXT APPOINTMENT DATE:@10 /21/2024  DO WE HAVE YOUR PERMISSION TO LEAVE A DETAILED MESSAGE?: Yes  OTHER COMMENTS: Patient now has medicare primary & wants to know how much insurance will cover & wants to start process since last time when he got [employer] it took several weeks to get approval.   **Let patient know to contact pharmacy at the end of the day to make sure medication is ready. **  ** Please notify patient to allow 48-72 hours to process**  **Encourage patient to contact the pharmacy for refills or they can request refills through [facility]**

## 2023-01-07 ENCOUNTER — Other Ambulatory Visit (HOSPITAL_COMMUNITY): Payer: Self-pay

## 2023-01-07 ENCOUNTER — Telehealth: Payer: Medicare Other | Admitting: Physician Assistant

## 2023-01-07 ENCOUNTER — Telehealth: Payer: Self-pay

## 2023-01-07 DIAGNOSIS — B9689 Other specified bacterial agents as the cause of diseases classified elsewhere: Secondary | ICD-10-CM | POA: Diagnosis not present

## 2023-01-07 DIAGNOSIS — J019 Acute sinusitis, unspecified: Secondary | ICD-10-CM | POA: Diagnosis not present

## 2023-01-07 MED ORDER — AMOXICILLIN-POT CLAVULANATE 875-125 MG PO TABS
1.0000 | ORAL_TABLET | Freq: Two times a day (BID) | ORAL | 0 refills | Status: AC
Start: 2023-01-07 — End: ?

## 2023-01-07 NOTE — Progress Notes (Signed)
I have spent 5 minutes in review of e-visit questionnaire, review and updating patient chart, medical decision making and response to patient.   Mia Milan Cody Jacklynn Dehaas, PA-C    

## 2023-01-07 NOTE — Progress Notes (Signed)
E-Visit for Sinus Problems  We are sorry that you are not feeling well.  Here is how we plan to help!  Based on what you have shared with me it looks like you have sinusitis.  Sinusitis is inflammation and infection in the sinus cavities of the head.  Based on your presentation I believe you most likely have Acute Bacterial Sinusitis.  This is an infection caused by bacteria and is treated with antibiotics. I have prescribed Augmentin 875mg /125mg  one tablet twice daily with food, for 7 days. Stop the Doxycycline and start the new antibiotic as directed.  You may use an oral decongestant such as Mucinex D or if you have glaucoma or high blood pressure use plain Mucinex. Saline nasal spray help and can safely be used as often as needed for congestion.  If you develop worsening sinus pain, fever or notice severe headache and vision changes, or if symptoms are not better after completion of antibiotic, please schedule an appointment with a health care provider.    Sinus infections are not as easily transmitted as other respiratory infection, however we still recommend that you avoid close contact with loved ones, especially the very young and elderly.  Remember to wash your hands thoroughly throughout the day as this is the number one way to prevent the spread of infection!  Home Care: Only take medications as instructed by your medical team. Complete the entire course of an antibiotic. Do not take these medications with alcohol. A steam or ultrasonic humidifier can help congestion.  You can place a towel over your head and breathe in the steam from hot water coming from a faucet. Avoid close contacts especially the very young and the elderly. Cover your mouth when you cough or sneeze. Always remember to wash your hands.  Get Help Right Away If: You develop worsening fever or sinus pain. You develop a severe head ache or visual changes. Your symptoms persist after you have completed your treatment  plan.  Make sure you Understand these instructions. Will watch your condition. Will get help right away if you are not doing well or get worse.  Thank you for choosing an e-visit.  Your e-visit answers were reviewed by a board certified advanced clinical practitioner to complete your personal care plan. Depending upon the condition, your plan could have included both over the counter or prescription medications.  Please review your pharmacy choice. Make sure the pharmacy is open so you can pick up prescription now. If there is a problem, you may contact your provider through [employer] and have the prescription routed to another pharmacy.  Your safety is important to [place]. If you have drug allergies check your prescription carefully.   For the next 24 hours you can use MyChart to ask questions about today's visit, request a non-urgent call back, or ask for a work or school excuse. You will get an email in the next two days asking about your experience. I hope that your e-visit has been valuable and will speed your recovery.

## 2023-01-07 NOTE — Telephone Encounter (Signed)
Pharmacy Patient Advocate Encounter   Received notification from [employer] that prior authorization for Joseph Santana is required/requested.   Per test claim: PA required; PA submitted to [facility] via CoverMyMeds Key/confirmation #/EOC [id number] Status is pending

## 2023-01-07 NOTE — Telephone Encounter (Signed)
LMTCB and send mychart message to see what exactly is needed as a 90 day script was sent on 12/25/22

## 2023-01-13 ENCOUNTER — Ambulatory Visit (INDEPENDENT_AMBULATORY_CARE_PROVIDER_SITE_OTHER): Payer: Self-pay | Admitting: Family Medicine

## 2023-01-13 DIAGNOSIS — S76312D Strain of muscle, fascia and tendon of the posterior muscle group at thigh level, left thigh, subsequent encounter: Secondary | ICD-10-CM

## 2023-01-13 DIAGNOSIS — M79661 Pain in right lower leg: Secondary | ICD-10-CM

## 2023-01-13 NOTE — Progress Notes (Signed)
PCP: Corwin Levins, MD  Subjective:   HPI: Patient is a 67 y.o. male here for shockwave treatments.  Patient returns for third shockwave treatment of right calf, left hamstring  Past Medical History:  Diagnosis Date   ALLERGIC REACTION 07/22/2009   ALLERGIC RHINITIS 10/08/2006   B12 DEFICIENCY 05/14/2010   Carpal tunnel syndrome 12/31/2007   Degeneration of cervical intervertebral disc 12/31/2007   HYPERLIPIDEMIA 10/08/2006   Hypogonadism in male 06/26/2009   Qualifier: Diagnosis of  By: Lovell Sheehan MD, John E    HYPOTHYROIDISM 10/08/2006   Irritable bowel syndrome 03/03/2008   LUMBAR RADICULOPATHY, RIGHT 12/31/2006   OSTEOARTHRITIS, LOWER LEG, LEFT 05/10/2009   SINUSITIS - ACUTE-NOS 07/22/2009   TESTICULAR HYPOFUNCTION 06/26/2009    Current Outpatient Medications on File Prior to Visit  Medication Sig Dispense Refill   amoxicillin-clavulanate (AUGMENTIN) 875-125 MG tablet Take 1 tablet by mouth 2 (two) times daily. 14 tablet 0   EPINEPHrine 0.3 mg/0.3 mL IJ SOAJ injection EpiPen 2-Pak 0.3 mg/0.3 mL injection, auto-injector     JATENZO 198 MG CAPS 1 CAPSULE WITH BREAKFAST AND 1 CAPSULE WITH DINNER DAILY 180 capsule 0   levothyroxine (SYNTHROID) 137 MCG tablet Take 1 tablet (137 mcg total) by mouth daily before breakfast. 90 tablet 1   montelukast (SINGULAIR) 10 MG tablet Take 10 mg by mouth at bedtime.     Multiple Vitamin (MULTIVITAMIN) capsule Take 1 capsule by mouth daily.     Triamcinolone Acetonide (NASACORT ALLERGY 24HR NA) Place into the nose.     No current facility-administered medications on file prior to visit.    Past Surgical History:  Procedure Laterality Date   BACK SURGERY     I & D EXTREMITY Right 05/24/2020   Procedure: IRRIGATION AND DEBRIDEMENT RIGHT THUMB;  Surgeon: Tarry Kos, MD;  Location: [facility];  Service: Orthopedics;  Laterality: Right;   KNEE SURGERY     LUMBAR LAMINECTOMY  2008   with revision   SPINAL FUSION  2011   spinal fusion  revision  2012    Allergies  Allergen Reactions   Iodine     IVP dye    Levofloxacin     There were no vitals taken for this visit.     12/22/2019   11:13 AM 01/31/2020    3:41 PM 02/14/2020    3:35 PM 03/15/2020    3:51 PM 02/28/2021    1:24 PM 04/02/2021    4:26 PM 06/25/2021    8:55 AM  [facility] Adult [facility]  Frequency of aerobic exercise (# of days/week) 7 7 6 6 6 6 4   Average time in minutes 90 90 75 75 80 80 90  Frequency of strengthening activities (# of days/week) 3 1 0 0 1 1 2         No data to display              Objective:  Physical Exam:  Gen: NAD, comfortable in exam room   Assessment & Plan:  1. Right calf strain, left hamstring strain - third shockwave treatments given today.  Was able to run 20 miles at about 9 minute mile overall pace, improving.  Continue home exercises.  F/u for fourth and expected final shockwave treatment - patient would like ultrasound at that time also.  Procedure: ECSWT Indications:  right calf strain   Procedure Details Consent: Risks of procedure as well as the alternatives and risks of each were explained to the patient.  Written  consent for procedure obtained. Time Out: Verified patient identification, verified procedure, site was marked, verified correct patient position, medications/allergies/relevent history reviewed.  The area was cleaned with alcohol swab.     The right medial gastroc was targeted for Extracorporeal shockwave therapy.    Preset: status post muscle injury Power Level: 100 Frequency: 15 Impulse/cycles: 3000 Head size: large   Patient tolerated procedure well without immediate complications   Procedure: ECSWT Indications:  left hamstring strain   Procedure Details Consent: Risks of procedure as well as the alternatives and risks of each were explained to the patient.  Written consent for procedure obtained. Time Out: Verified patient identification, verified procedure, site  was marked, verified correct patient position, medications/allergies/relevent history reviewed.  The area was cleaned with alcohol swab.     The left hamstring was targeted for Extracorporeal shockwave therapy.    Preset: status post muscle injury Power Level: 100 Frequency: 15 Impulse/cycles: 3000 Head size: large   Patient tolerated procedure well without immediate complications

## 2023-01-13 NOTE — Telephone Encounter (Signed)
Pharmacy Patient Advocate Encounter  Received notification from [facility] that Prior Authorization for Brooks Sailors has been APPROVED through 04/14/2098   PA #/Case ID/Reference #: [id number]

## 2023-01-20 ENCOUNTER — Ambulatory Visit: Payer: Medicare Other | Admitting: Family Medicine

## 2023-01-27 ENCOUNTER — Ambulatory Visit: Payer: Self-pay | Admitting: Family Medicine

## 2023-01-28 ENCOUNTER — Other Ambulatory Visit: Payer: Self-pay

## 2023-01-28 ENCOUNTER — Ambulatory Visit (INDEPENDENT_AMBULATORY_CARE_PROVIDER_SITE_OTHER): Payer: Self-pay | Admitting: Family Medicine

## 2023-01-28 ENCOUNTER — Encounter: Payer: Self-pay | Admitting: Family Medicine

## 2023-01-28 ENCOUNTER — Telehealth: Payer: Self-pay | Admitting: Endocrinology

## 2023-01-28 VITALS — Ht 70.0 in

## 2023-01-28 DIAGNOSIS — M79661 Pain in right lower leg: Secondary | ICD-10-CM

## 2023-01-28 DIAGNOSIS — E039 Hypothyroidism, unspecified: Secondary | ICD-10-CM

## 2023-01-28 DIAGNOSIS — S76302D Unspecified injury of muscle, fascia and tendon of the posterior muscle group at thigh level, left thigh, subsequent encounter: Secondary | ICD-10-CM

## 2023-01-28 MED ORDER — LEVOTHYROXINE SODIUM 137 MCG PO TABS
137.0000 ug | ORAL_TABLET | Freq: Every day | ORAL | 1 refills | Status: DC
Start: 2023-01-28 — End: 2023-07-23

## 2023-01-28 NOTE — Telephone Encounter (Signed)
Synthroid refill request complete

## 2023-01-28 NOTE — Progress Notes (Signed)
HPI: Patient is here for follow-up of his right calf strain and left hamstring strain.  Patient notes that after his previous shockwave therapy he had continued improvement. Patient does have his upcoming marathon in November  Procedure: ECSWT Indications:  right calf strain   Procedure Details Consent: Risks of procedure as well as the alternatives and risks of each were explained to the patient.  Written consent for procedure obtained. Time Out: Verified patient identification, verified procedure, site was marked, verified correct patient position, medications/allergies/relevent history reviewed.  The area was cleaned with alcohol swab.     The right medial gastroc was targeted for Extracorporeal shockwave therapy.    Preset: status post muscle injury Power Level: 100 Frequency: 15 Impulse/cycles: 3000 Head size: large   Patient tolerated procedure well without immediate complications   Procedure: ECSWT Indications:  left hamstring strain   Procedure Details Consent: Risks of procedure as well as the alternatives and risks of each were explained to the patient.  Written consent for procedure obtained. Time Out: Verified patient identification, verified procedure, site was marked, verified correct patient position, medications/allergies/relevent history reviewed.  The area was cleaned with alcohol swab.     The left hamstring was targeted for Extracorporeal shockwave therapy.    Preset: status post muscle injury Power Level: 100 Frequency: 15 Impulse/cycles: 3000 Head size: large   Patient tolerated procedure well without immediate complications  Discussed with patient that likely next shockwave therapy will be the last as his marathon is upcoming.  Patient to follow-up in approximately a week or so.

## 2023-01-28 NOTE — Patient Instructions (Signed)

## 2023-01-28 NOTE — Telephone Encounter (Signed)
MEDICATION: Levothyroxine  [facility] [address]  HAS THE PATIENT CONTACTED THEIR PHARMACY?  yes  IS THIS A 90 DAY SUPPLY : yes  IS PATIENT OUT OF MEDICATION:  NO  IF NOT; HOW MUCH IS LEFT:  a few days   LAST APPOINTMENT DATE: @9 /24/2024  NEXT APPOINTMENT DATE:@10 /21/2024  DO WE HAVE YOUR PERMISSION TO LEAVE A DETAILED MESSAGE?:  OTHER COMMENTS: per patient he had recent labs with Dr. Jonny Ruiz his PCP   **Let patient know to contact pharmacy at the end of the day to make sure medication is ready. **  ** Please notify patient to allow 48-72 hours to process**  **Encourage patient to contact the pharmacy for refills or they can request refills through [facility]**

## 2023-02-03 ENCOUNTER — Ambulatory Visit (INDEPENDENT_AMBULATORY_CARE_PROVIDER_SITE_OTHER): Payer: Medicare Other | Admitting: Endocrinology

## 2023-02-03 ENCOUNTER — Ambulatory Visit (INDEPENDENT_AMBULATORY_CARE_PROVIDER_SITE_OTHER): Payer: Self-pay | Admitting: Family Medicine

## 2023-02-03 ENCOUNTER — Encounter: Payer: Self-pay | Admitting: Endocrinology

## 2023-02-03 ENCOUNTER — Encounter: Payer: Self-pay | Admitting: Family Medicine

## 2023-02-03 VITALS — BP 120/70 | HR 54 | Resp 20 | Ht 70.0 in | Wt 153.4 lb

## 2023-02-03 VITALS — Ht 70.0 in

## 2023-02-03 DIAGNOSIS — E039 Hypothyroidism, unspecified: Secondary | ICD-10-CM | POA: Diagnosis not present

## 2023-02-03 DIAGNOSIS — E230 Hypopituitarism: Secondary | ICD-10-CM | POA: Diagnosis not present

## 2023-02-03 DIAGNOSIS — S76302D Unspecified injury of muscle, fascia and tendon of the posterior muscle group at thigh level, left thigh, subsequent encounter: Secondary | ICD-10-CM

## 2023-02-03 DIAGNOSIS — M79661 Pain in right lower leg: Secondary | ICD-10-CM

## 2023-02-03 NOTE — Progress Notes (Signed)
Patient is here for sixth shockwave treatment to right medial gastroc, left hamstring.  Procedure: ECSWT Indications:  right calf strain   Procedure Details Consent: Risks of procedure as well as the alternatives and risks of each were explained to the patient.  Written consent for procedure obtained. Time Out: Verified patient identification, verified procedure, site was marked, verified correct patient position, medications/allergies/relevent history reviewed.  The area was cleaned with alcohol swab.     The right medial gastrocnemius was targeted for Extracorporeal shockwave therapy.    Preset: status post muscle injury Power Level: 100 Frequency: 15 Impulse/cycles: 3000 Head size: large   Patient tolerated procedure well without immediate complications   Procedure: ECSWT Indications:  left hamstring strain   Procedure Details Consent: Risks of procedure as well as the alternatives and risks of each were explained to the patient.  Written consent for procedure obtained. Time Out: Verified patient identification, verified procedure, site was marked, verified correct patient position, medications/allergies/relevent history reviewed.  The area was cleaned with alcohol swab.     The left mid-distal hamstrings were targeted for Extracorporeal shockwave therapy.    Preset: status post muscle injury Power Level: 100 Frequency: 15 Impulse/cycles: 3000 Head size: large   Patient tolerated procedure well without immediate complications   He will follow up in 1 week for one final treatment before he travels for his race.

## 2023-02-03 NOTE — Progress Notes (Signed)
Outpatient Endocrinology Note [place] Joncarlo Friberg, MD  02/03/23  Patient's Name: Joseph Santana    DOB: 10/16/1955    MRN: [id number]  REASON OF VISIT: Follow-up for hypogonadism/hypothyroidism  PCP: Corwin Levins, MD  HISTORY OF PRESENT ILLNESS:   Joseph Santana is a 67 y.o. old male with past medical history as listed below is presented for a follow up of hypogonadism and hypothyroidism.  Patient was last seen by Dr. Lucianne Muss in April 2024.  Pertinent History: # Hypogonadism : He was diagnosed to have a low testosterone level in 2012 or so when he was coming to his physician with complaints of increased fatigue.  Not clear what his baseline testosterone level was but he had a level of about 130 in 2013 around the time when he was having back surgery. No record of detail evaluation with pituitary function available. He thinks he was tried on AndroGel initially but because of skin irritation he was changed to testosterone injections.  He may have been prescribed Axiron also but does not remember this. He thinks he had significantly better with taking testosterone injections, apparently was taking about 100 mg every 2 weeks.  On his initial consultation with endocrinology, he was complaining of fatigue , baseline testosterone was 208 with LH of 2.1 and normal prolactin, free testosterone not done by the lab. He had been on a trial of clomiphene half tablet, initially 3 times a week since 01/11/16. With this he had more energy after starting treatment. With this his testosterone level came back to normal until 2/18 when it was 494, subsequently had been progressively declining and was 273 done in 1/19. This was despite taking 50 mg 5 days a week; with this he was complaining of more fatigue, sluggishness and decreased libido and motivation. Previously on Fortesta gel but this was changed because of redness, irritation and sunburn-like reaction on the skin. Because of the skin irritation he was changed to the  Axiron preparation, but this could not keep his levels consistent   He was started on Jatenzo in 06/2019 after approval through his insurance. Initially started on 158 mg and subsequently taking 198 mg twice a day since 02/2020.   # Hypothyroidism was first diagnosed  about 30 years ago. At the time of diagnosis patient was having symptoms of  fatigue but he is not sure about all the other symptoms. Most probably tested also because of strong family history of thyroid disease. He takes his thyroid supplement regularly   Since about 12/2021 he has switched to taking his levothyroxine at  bedtime which is about 3 hours after dinner. He did this because he could not comply with taking it 30 to 60 minutes before breakfast in the morning.   Interval history 02/03/23 Patient has been taking Jatenzo 198 mg 2 times a day with meals.  His total testosterone level recently was acceptable.  He has complaints of occasional mild fatigue otherwise no complaints today.  He has been taking levothyroxine 137 mcg daily.  Dose was decreased from 150 mcg daily from around April 2024 when TSH was low at that time.  He has no hypo and hyperthyroid symptoms.  Thyroid function test recently was normal as noted below.   Latest Reference Range & Units 01/01/23 10:07  Testosterone 300.00 - 890.00 ng/dL 478.29  TSH 5.62 - 1.30 uIU/mL 0.37  T4,Free(Direct) 0.60 - 1.60 ng/dL 8.65   Hematocrit, liver enzymes and PSA were normal in the last few months.  REVIEW  OF SYSTEMS:  As per history of present illness.   PAST MEDICAL HISTORY: Past Medical History:  Diagnosis Date   ALLERGIC REACTION 07/22/2009   ALLERGIC RHINITIS 10/08/2006   B12 DEFICIENCY 05/14/2010   Carpal tunnel syndrome 12/31/2007   Degeneration of cervical intervertebral disc 12/31/2007   HYPERLIPIDEMIA 10/08/2006   Hypogonadism in male 06/26/2009   Qualifier: Diagnosis of  By: Lovell Sheehan MD, John E    HYPOTHYROIDISM 10/08/2006   Irritable bowel syndrome 03/03/2008    LUMBAR RADICULOPATHY, RIGHT 12/31/2006   OSTEOARTHRITIS, LOWER LEG, LEFT 05/10/2009   SINUSITIS - ACUTE-NOS 07/22/2009   TESTICULAR HYPOFUNCTION 06/26/2009    PAST SURGICAL HISTORY: Past Surgical History:  Procedure Laterality Date   BACK SURGERY     I & D EXTREMITY Right 05/24/2020   Procedure: IRRIGATION AND DEBRIDEMENT RIGHT THUMB;  Surgeon: Tarry Kos, MD;  Location: [facility];  Service: Orthopedics;  Laterality: Right;   KNEE SURGERY     LUMBAR LAMINECTOMY  2008   with revision   SPINAL FUSION  2011   spinal fusion revision  2012    ALLERGIES: Allergies  Allergen Reactions   Iodine     IVP dye    Levofloxacin     FAMILY HISTORY:  Family History  Problem Relation Age of Onset   Hypothyroidism Father    Asthma Other    Diabetes Other    Allergy (severe) Other    Hypothyroidism Mother    Hypothyroidism Sister    Hypothyroidism Brother     SOCIAL HISTORY: Social History   Socioeconomic History   Marital status: Married    Spouse name: Not on file   Number of children: Not on file   Years of education: Not on file   Highest education level: Bachelor's degree (e.g., BA, AB, BS)  Occupational History   Not on file  Tobacco Use   Smoking status: Never   Smokeless tobacco: Never  Substance and Sexual Activity   Alcohol use: No   Drug use: No   Sexual activity: Yes  Other Topics Concern   Not on file  Social History Narrative   Not on file   Social Determinants of Health   Financial Resource Strain: Low Risk  (08/24/2022)   Overall Financial Resource Strain (CARDIA)    Difficulty of Paying Living Expenses: Not hard at all  Food Insecurity: No Food Insecurity (08/24/2022)   Hunger Vital Sign    Worried About Running Out of Food in the Last Year: Never true    Ran Out of Food in the Last Year: Never true  Transportation Needs: No Transportation Needs (08/24/2022)   PRAPARE - Administrator, Civil Service (Medical): No    Lack  of Transportation (Non-Medical): No  Physical Activity: Sufficiently Active (08/24/2022)   Exercise Vital Sign    Days of Exercise per Week: 6 days    Minutes of Exercise per Session: 80 min  Stress: No Stress Concern Present (08/24/2022)   [employer] of Occupational Health - Occupational Stress Questionnaire    Feeling of Stress : Not at all  Social Connections: Unknown (08/24/2022)   Social Connection and Isolation Panel [NHANES]    Frequency of Communication with Friends and Family: Patient declined    Frequency of Social Gatherings with Friends and Family: Patient declined    Attends Religious Services: Patient declined    Database administrator or Organizations: No    Attends Banker Meetings: Not on file  Marital Status: Married    MEDICATIONS:  Current Outpatient Medications  Medication Sig Dispense Refill   EPINEPHrine 0.3 mg/0.3 mL IJ SOAJ injection EpiPen 2-Pak 0.3 mg/0.3 mL injection, auto-injector     JATENZO 198 MG CAPS 1 CAPSULE WITH BREAKFAST AND 1 CAPSULE WITH DINNER DAILY 180 capsule 0   levothyroxine (SYNTHROID) 137 MCG tablet Take 1 tablet (137 mcg total) by mouth daily before breakfast. 90 tablet 1   montelukast (SINGULAIR) 10 MG tablet Take 10 mg by mouth at bedtime.     Multiple Vitamin (MULTIVITAMIN) capsule Take 1 capsule by mouth daily.     Triamcinolone Acetonide (NASACORT ALLERGY 24HR NA) Place into the nose.     amoxicillin-clavulanate (AUGMENTIN) 875-125 MG tablet Take 1 tablet by mouth 2 (two) times daily. (Patient not taking: Reported on 02/03/2023) 14 tablet 0   No current facility-administered medications for this visit.    PHYSICAL EXAM: Vitals:   02/03/23 1416  BP: 120/70  Pulse: (!) 54  Resp: 20  SpO2: 98%  Weight: 153 lb 6.4 oz (69.6 kg)  Height: 5\' 10"  (1.778 m)   Body mass index is 22.01 kg/m.  Wt Readings from Last 3 Encounters:  02/03/23 153 lb 6.4 oz (69.6 kg)  01/01/23 153 lb (69.4 kg)  12/18/22 147 lb (66.7  kg)     General: Well developed, well nourished male in no apparent distress.  HEENT: AT/[place], no external lesions. Hearing intact to the spoken word Eyes: Conjunctiva clear and no icterus. Neck: Trachea midline, neck supple  Abdomen: Soft, non tender, non distended Neurologic: Alert, oriented, normal speech, deep tendon biceps reflexes normal,  no gross focal neurological deficit Extremities: No pedal pitting edema, no tremors of outstretched hands Skin: Warm, color good.  Psychiatric: Does not appear depressed or anxious  PERTINENT HISTORIC LABORATORY AND IMAGING STUDIES:  All pertinent laboratory results were reviewed. Please see HPI also for further details.   TSH  Date Value Ref Range Status  01/01/2023 0.37 0.35 - 5.50 uIU/mL Final  09/30/2022 0.20 (L) 0.35 - 5.50 uIU/mL Final  08/06/2022 0.12 (L) 0.35 - 5.50 uIU/mL Final     ASSESSMENT / PLAN  1. Hypogonadotropic hypogonadism (HCC)   2. Acquired hypothyroidism    -Patient has longstanding history of hypogonadotropic hypogonadism.  Baseline testosterone was 208 with symptoms.  Recent total testosterone level acceptable. -Continue current dose of Jatenzo 198 mg 2 times a day with food.  # Primary hypothyroidism -Currently taking levothyroxine 137 mcg daily.  Recent thyroid lab normal.  Continue current dose.  Follow-up in 6 months with labs as ordered below.  Diagnoses and all orders for this visit:  Hypogonadotropic hypogonadism (HCC) -     Testosterone, Total, LC/MS/MS; Future  Acquired hypothyroidism -     T4, free; Future -     TSH; Future    DISPOSITION Follow up in clinic in 6 months suggested.  All questions answered and patient verbalized understanding of the plan.  [place] Ladrea Holladay, MD [facility] Endocrinology [facility] [address] [place], [address] [place], [place] [address] Phone # [phone]  At least part of this note was generated using voice recognition software. Inadvertent word  errors may have occurred, which were not recognized during the proofreading process.

## 2023-02-10 ENCOUNTER — Ambulatory Visit (INDEPENDENT_AMBULATORY_CARE_PROVIDER_SITE_OTHER): Payer: Self-pay | Admitting: Family Medicine

## 2023-02-10 DIAGNOSIS — S76302D Unspecified injury of muscle, fascia and tendon of the posterior muscle group at thigh level, left thigh, subsequent encounter: Secondary | ICD-10-CM

## 2023-02-10 DIAGNOSIS — M79661 Pain in right lower leg: Secondary | ICD-10-CM

## 2023-02-11 ENCOUNTER — Encounter: Payer: Self-pay | Admitting: Family Medicine

## 2023-02-11 NOTE — Progress Notes (Signed)
PCP: Corwin Levins, MD  Subjective:   HPI: Patient is a 67 y.o. male here for seventh shockwave treatment to right medial gastroc, left hamstring.  Procedure: ECSWT Indications:  right calf strain   Procedure Details Consent: Risks of procedure as well as the alternatives and risks of each were explained to the patient.  Written consent for procedure obtained. Time Out: Verified patient identification, verified procedure, site was marked, verified correct patient position, medications/allergies/relevent history reviewed.  The area was cleaned with alcohol swab.     The right medial gastrocnemius was targeted for Extracorporeal shockwave therapy.    Preset: status post muscle injury Power Level: 100 Frequency: 15 Impulse/cycles: 3000 Head size: large   Patient tolerated procedure well without immediate complications   Procedure: ECSWT Indications:  left hamstring strain   Procedure Details Consent: Risks of procedure as well as the alternatives and risks of each were explained to the patient.  Written consent for procedure obtained. Time Out: Verified patient identification, verified procedure, site was marked, verified correct patient position, medications/allergies/relevent history reviewed.  The area was cleaned with alcohol swab.     The left mid-distal hamstrings were targeted for Extracorporeal shockwave therapy.    Preset: status post muscle injury Power Level: 100 Frequency: 15 Impulse/cycles: 3000 Head size: large   Patient tolerated procedure well without immediate complications

## 2023-03-09 ENCOUNTER — Other Ambulatory Visit: Payer: Self-pay | Admitting: Family Medicine

## 2023-05-03 ENCOUNTER — Telehealth: Payer: Medicare Other | Admitting: Physician Assistant

## 2023-05-03 DIAGNOSIS — J069 Acute upper respiratory infection, unspecified: Secondary | ICD-10-CM

## 2023-05-03 NOTE — Progress Notes (Signed)
  Because of your symptoms and the duration, I feel your condition warrants further evaluation and I recommend that you be seen in a face-to-face visit. Face to face visit will not guarantee an antibiotic however will provide an in depth assessment and testing for influenza and COVID-19.   Providers prescribe antibiotics to treat infections caused by bacteria. Antibiotics are very powerful in treating bacterial infections when they are used properly. To maintain their effectiveness, they should be used only when necessary. Overuse of antibiotics has resulted in the development of superbugs that are resistant to treatment.        NOTE: There will be NO CHARGE for this E-Visit   If you are having a true medical emergency, please call 911.     For an urgent face to face visit, [place] has multiple urgent care centers for your convenience.  Click the link below for the full list of locations and hours, walk-in wait times, appointment scheduling options and driving directions:  Urgent Care - [place], [place], [place], [place], [place], [place]  [place]     Your MyChart E-visit questionnaire answers were reviewed by a board certified advanced clinical practitioner to complete your personal care plan based on your specific symptoms.    Thank you for using e-Visits.

## 2023-05-24 ENCOUNTER — Other Ambulatory Visit: Payer: Self-pay | Admitting: Endocrinology

## 2023-05-28 ENCOUNTER — Telehealth: Payer: Self-pay | Admitting: Endocrinology

## 2023-05-28 DIAGNOSIS — E230 Hypopituitarism: Secondary | ICD-10-CM

## 2023-05-28 MED ORDER — JATENZO 198 MG PO CAPS: ORAL_CAPSULE | ORAL | 1 refills | Status: DC

## 2023-05-28 NOTE — Telephone Encounter (Signed)
MEDICATION:  Jatenzo JATENZO 198 MG CAPS  PHARMACY:  [id number] - [place], [place] - [address] (Ph: [phone])   HAS THE PATIENT CONTACTED THEIR PHARMACY?  Yes  IS THIS A 90 DAY SUPPLY : Yes  IS PATIENT OUT OF MEDICATION: Will be out after today  IF NOT; HOW MUCH IS LEFT:   LAST APPOINTMENT DATE: @10 /21/2024  NEXT APPOINTMENT DATE:@5 /04/2023  DO WE HAVE YOUR PERMISSION TO LEAVE A DETAILED MESSAGE?: Yes  OTHER COMMENTS:    **Let patient know to contact pharmacy at the end of the day to make sure medication is ready. **  ** Please notify patient to allow 48-72 hours to process**  **Encourage patient to contact the pharmacy for refills or they can request refills through [facility]**

## 2023-05-28 NOTE — Telephone Encounter (Signed)
Sent prescription

## 2023-06-03 ENCOUNTER — Other Ambulatory Visit: Payer: Self-pay

## 2023-06-03 ENCOUNTER — Ambulatory Visit (INDEPENDENT_AMBULATORY_CARE_PROVIDER_SITE_OTHER): Payer: Medicare Other | Admitting: Family Medicine

## 2023-06-03 ENCOUNTER — Encounter: Payer: Self-pay | Admitting: Family Medicine

## 2023-06-03 VITALS — BP 110/68 | Ht 70.0 in | Wt 150.0 lb

## 2023-06-03 DIAGNOSIS — M722 Plantar fascial fibromatosis: Secondary | ICD-10-CM

## 2023-06-03 DIAGNOSIS — M79662 Pain in left lower leg: Secondary | ICD-10-CM

## 2023-06-03 NOTE — Progress Notes (Signed)
 DATE OF VISIT: 06/03/2023        Joseph Santana DOB: 14-Jan-1956 MRN: [id number]  CC:  left calf pain  History of present Illness: Joseph Santana is a 68 y.o. male who presents for a follow-up visit left calf pain Pain along the medial proximal calf No pain with single leg raises Pain with running - feels tight - sometimes improves after 1-mile - last Thursday was trying to do a run & unable to run fast or slow due to pain Ran today - pain was 4-5/10 today in the first 1.5 miles, then 2-3/10 for the last 3.5 miles (total of 5 miles) - not as painful as last week Able to do strength workout on Sunday without issues  Doing a lot of home work - did over 30+ flights of stairs over the weekend and yesterday -- getting ready to move  Has been using calf sleeve Started using topical Nitroglycerin patches a few days ago  Has some races in the future - [place] in April 2025 - [place] in Sept 2025 - [place] in Nov 2025  Previous Rt calf issues treated with 4 sessions of ESWT in Sept 2024 Previous Lt calf issues Fall 2023 - 02/13/22 -- normal MSK u/s - 12/31/21 showing low grade partial medial gastroc - was treated with 4 sessions of ESWT in Sept 2023   Medications:  Outpatient Encounter Medications as of 06/03/2023  Medication Sig   amoxicillin-clavulanate (AUGMENTIN) 875-125 MG tablet Take 1 tablet by mouth 2 (two) times daily. (Patient not taking: Reported on 02/03/2023)   EPINEPHrine 0.3 mg/0.3 mL IJ SOAJ injection EpiPen 2-Pak 0.3 mg/0.3 mL injection, auto-injector   JATENZO 198 MG CAPS 1 CAPSULE BY MOUTH WITH BREAKFAST AND 1 CAPSULE WITH DINNER DAILY   levothyroxine (SYNTHROID) 137 MCG tablet Take 1 tablet (137 mcg total) by mouth daily before breakfast.   montelukast (SINGULAIR) 10 MG tablet Take 10 mg by mouth at bedtime.   Multiple Vitamin (MULTIVITAMIN) capsule Take 1 capsule by mouth daily.   Triamcinolone Acetonide (NASACORT ALLERGY 24HR NA) Place into the nose.   No  facility-administered encounter medications on file as of 06/03/2023.    Allergies: is allergic to iodine and levofloxacin.  Physical Examination: Vitals: BP 110/68   Ht 5\' 10"  (1.778 m)   Wt 150 lb (68 kg)   BMI 21.52 kg/m  GENERAL:  Joseph Santana is a 67 y.o. male appearing their stated age, alert and oriented x 3, in no apparent distress.  SKIN: no rashes or lesions, skin clean, dry, intact MSK:  Lower leg:  Lt calf without gross deformity.  Has NTG patch in place along proximal medial gastroc.  No increased redness, no bruising, no swelling.  TTP along muscle belly of proximal medial gastroc.  No tenderness along the lateral gastrocnemius.  Full range of motion of the ankle without pain.  Does have decreased ankle strength on the left 4/5. Right calf without any gross deformity.  No tenderness.  Ankle strength 5/5. Walking without a limp NEURO: sensation intact to light touch lower ext bilaterally VASC: pulses 2+ and symmetric DP/PT bilaterally, no edema  Radiology: Limited MSK ultrasound left calf Date: 06/03/2023 Indication: Left calf pain Findings: -Normal-appearing lateral gastrocnemius and long and short axis views.  Normal myotendinous junction -Medial gastroc with signs of hyperechoic change consistent with grade 1 strain.  No significant tears appreciated.  Normal-appearing myotendinous junction  Summary: Grade 1 medial gastrocnemius strain Images and interpretation completed by Fae Pippin  Christella Hartigan, DO today  Assessment & Plan Pain of left calf Grade 1 medial gastrocnemius strain as seen on MSK ultrasound, prior history of this in fall 2023 as well  Plan: -MSK ultrasound completed as noted above -Should continue with calf sleeve as he is doing -Okay to continue with topical nitroglycerin as he is doing -Home exercise program provided.  Could consider formal PT in the future if needed -He has done quite well with shockwave therapy in the past.  Will schedule him for this at  his convenience.  He is aware of the cost.  Will likely require 4-6 sessions -He should rest from running, as well as weight lifting activities with the lower extremity for at least the next 1 to 2 weeks.  Is okay to do nonimpact activity such as stationary bike or elliptical as long as not causing pain -Recommended he try to rest from cleaning out his attic if possible, especially given the number of stairs that he has to go up and down -Follow-up for shockwave therapy at his earliest convenience Plantar fasciitis of right foot At end of visit also noted some plantar fascia pain, has had plantar fasciitis in the past.  Likely related to compensation from his left calf injury  Plan: -Home exercise program provided  Patient expressed understanding & agreement with above.  Encounter Diagnoses  Name Primary?   Pain of left calf Yes   Plantar fasciitis of right foot     Orders Placed This Encounter  Procedures   [place] LIMITED JOINT SPACE STRUCTURES LOW LEFT

## 2023-06-06 ENCOUNTER — Ambulatory Visit: Payer: Self-pay | Admitting: Family Medicine

## 2023-06-10 ENCOUNTER — Encounter: Payer: Self-pay | Admitting: Family Medicine

## 2023-06-10 ENCOUNTER — Ambulatory Visit (INDEPENDENT_AMBULATORY_CARE_PROVIDER_SITE_OTHER): Payer: Self-pay | Admitting: Family Medicine

## 2023-06-10 VITALS — Ht 70.0 in

## 2023-06-10 DIAGNOSIS — S86812D Strain of other muscle(s) and tendon(s) at lower leg level, left leg, subsequent encounter: Secondary | ICD-10-CM

## 2023-06-10 NOTE — Progress Notes (Signed)
 FOLLOW-UP VISIT:  Shockwave Therapy Procedure   NAME:  Joseph Santana DOB:  1955/05/22 MR#: [id number] DATE OF VISIT:  06/10/2023  Encounter:   Joseph Santana comes in for a follow up evaluation and 1st Radial Extracorporeal Shockwave therapy (ESWT) treatment to the  left calf for calf strain.  Tried running on Sunday - did 8 miles, mild soreness, had some hamstring tightness Overall feeling better   ESWT Procedure Note: Treatment #1  Diagnosis: Lt calf strain - medial gastroc  Procedure Details  Consent for the procedure was obtained. Time out was performed. The anatomical landmarks and target areas for the procedure were identified.   PROCEDURE: ESWT was discussed with the patient in detail.  The risk and benefits were explained.  The point of maximal tenderness was identified and the oscillator probe was applied with moderate pressure. Treatment adjusted as mediated by patient feedback/pain.  Left calf - medial gastroc was targeted for ESWT  Preset: s/p muscle injury Power level: 100 MJ Frequency: 15 Hz Impulses: 3000 Head size: large   Complications:  None; patient tolerated the procedure well.  Assessment & Plan Strain of calf muscle, left, subsequent encounter  Plan:   1. Return in 1 week for ESWT procedure #2 2. Procedure aftercare reviewed - should avoid any running for at least the next 2-3 days.  Ok to ease back into running if not having pain 3. Recommended avoiding strenuous activities and using OTC analgesia prn.

## 2023-06-10 NOTE — Patient Instructions (Signed)

## 2023-06-13 ENCOUNTER — Ambulatory Visit: Payer: Self-pay | Admitting: Family Medicine

## 2023-06-16 ENCOUNTER — Ambulatory Visit (INDEPENDENT_AMBULATORY_CARE_PROVIDER_SITE_OTHER): Payer: Self-pay | Admitting: Family Medicine

## 2023-06-16 ENCOUNTER — Other Ambulatory Visit: Payer: Self-pay

## 2023-06-16 ENCOUNTER — Encounter: Payer: Self-pay | Admitting: Family Medicine

## 2023-06-16 DIAGNOSIS — S86812D Strain of other muscle(s) and tendon(s) at lower leg level, left leg, subsequent encounter: Secondary | ICD-10-CM

## 2023-06-16 MED ORDER — NITROGLYCERIN 0.2 MG/HR TD PT24: MEDICATED_PATCH | TRANSDERMAL | 0 refills | Status: DC

## 2023-06-16 NOTE — Progress Notes (Signed)
 Patient returns for second shockwave treatment of left calf. Training for [employer] next month. Feels some improvement after last treatment though was sore.  Procedure: ECSWT Indications:  left calf strain   Procedure Details Consent: Risks of procedure as well as the alternatives and risks of each were explained to the patient.  Written consent for procedure obtained. Time Out: Verified patient identification, verified procedure, site was marked, verified correct patient position, medications/allergies/relevent history reviewed.  The area was cleaned with alcohol swab.     The left medial gastroc was targeted for Extracorporeal shockwave therapy.    Preset: achilles tendinitis Power Level: 110 Frequency: 15 Impulse/cycles: 3000 Head size: large   Patient tolerated procedure well without immediate complications.  Follow up in 1 week for third treatment.

## 2023-06-19 ENCOUNTER — Ambulatory Visit: Payer: Self-pay | Admitting: Sports Medicine

## 2023-06-23 ENCOUNTER — Ambulatory Visit: Payer: Self-pay | Admitting: Family Medicine

## 2023-06-30 ENCOUNTER — Ambulatory Visit: Payer: Self-pay | Admitting: Family Medicine

## 2023-07-22 ENCOUNTER — Other Ambulatory Visit: Payer: Self-pay

## 2023-07-22 DIAGNOSIS — E230 Hypopituitarism: Secondary | ICD-10-CM

## 2023-07-22 DIAGNOSIS — E039 Hypothyroidism, unspecified: Secondary | ICD-10-CM

## 2023-07-23 ENCOUNTER — Other Ambulatory Visit: Payer: Self-pay | Admitting: Endocrinology

## 2023-07-23 DIAGNOSIS — E039 Hypothyroidism, unspecified: Secondary | ICD-10-CM

## 2023-08-11 ENCOUNTER — Other Ambulatory Visit: Payer: Medicare Other

## 2023-08-11 ENCOUNTER — Telehealth: Payer: Self-pay | Admitting: Internal Medicine

## 2023-08-11 DIAGNOSIS — R9431 Abnormal electrocardiogram [ECG] [EKG]: Secondary | ICD-10-CM

## 2023-08-11 DIAGNOSIS — R739 Hyperglycemia, unspecified: Secondary | ICD-10-CM

## 2023-08-11 DIAGNOSIS — E7800 Pure hypercholesterolemia, unspecified: Secondary | ICD-10-CM

## 2023-08-11 NOTE — Telephone Encounter (Signed)
 Copied from CRM [id number]. Topic: Clinical - Request for Lab/Test Order >> Aug 11, 2023  3:38 PM Joseph Santana wrote: Reason for CRM: Patient had a CT CARDIAC SCORE 09/02/2022 and would like to schedule another one

## 2023-08-11 NOTE — Telephone Encounter (Signed)
Ok this is ordered 

## 2023-08-12 ENCOUNTER — Encounter: Payer: Self-pay | Admitting: Endocrinology

## 2023-08-14 ENCOUNTER — Encounter: Payer: Self-pay | Admitting: Endocrinology

## 2023-08-14 ENCOUNTER — Ambulatory Visit (INDEPENDENT_AMBULATORY_CARE_PROVIDER_SITE_OTHER): Payer: Medicare Other | Admitting: Endocrinology

## 2023-08-14 VITALS — BP 110/80 | HR 59 | Resp 20 | Ht 70.0 in | Wt 157.2 lb

## 2023-08-14 DIAGNOSIS — E039 Hypothyroidism, unspecified: Secondary | ICD-10-CM | POA: Diagnosis not present

## 2023-08-14 DIAGNOSIS — Z125 Encounter for screening for malignant neoplasm of prostate: Secondary | ICD-10-CM

## 2023-08-14 DIAGNOSIS — E230 Hypopituitarism: Secondary | ICD-10-CM

## 2023-08-14 DIAGNOSIS — Z5181 Encounter for therapeutic drug level monitoring: Secondary | ICD-10-CM

## 2023-08-14 DIAGNOSIS — R351 Nocturia: Secondary | ICD-10-CM

## 2023-08-14 MED ORDER — LEVOTHYROXINE SODIUM 137 MCG PO TABS
137.0000 ug | ORAL_TABLET | Freq: Every day | ORAL | 3 refills | Status: DC
Start: 2023-08-14 — End: 2023-11-10

## 2023-08-14 MED ORDER — JATENZO 198 MG PO CAPS
ORAL_CAPSULE | ORAL | 5 refills | Status: DC
Start: 2023-08-14 — End: 2023-11-26

## 2023-08-14 NOTE — Progress Notes (Signed)
 Outpatient Endocrinology Note [place] Ginette Bradway, MD  08/14/23  Patient's Name: Joseph Santana    DOB: [birth year]    MRN: [id number]  REASON OF VISIT: Follow-up for hypogonadism/hypothyroidism  PCP: Roslyn Coombe, MD  HISTORY OF PRESENT ILLNESS:   Joseph Santana is a 68 y.o. old male with past medical history as listed below is presented for a follow up of hypogonadism and hypothyroidism.    Pertinent History: Patient was previously seen by Dr. Hubert Madden and was last time seen in April 2024.  # Hypogonadism : He was diagnosed to have a low testosterone  level in 2012 or so when he was coming to his physician with complaints of increased fatigue.  Not clear what his baseline testosterone  level was but he had a level of about 130 in 2013 around the time when he was having back surgery. No record of detail evaluation with pituitary function available. He thinks he was tried on AndroGel  initially but because of skin irritation he was changed to testosterone  injections.  He may have been prescribed Axiron  also but does not remember this. He thinks he had significantly better with taking testosterone  injections, apparently was taking about 100 mg every 2 weeks.  On his initial consultation with endocrinology, he was complaining of fatigue , baseline testosterone  was 208 with LH of 2.1 and normal prolactin, free testosterone  not done by the lab. He had been on a trial of clomiphene  half tablet, initially 3 times a week since 01/11/16. With this he had more energy after starting treatment. With this his testosterone  level came back to normal until 2/18 when it was 494, subsequently had been progressively declining and was 273 done in 1/19. This was despite taking 50 mg 5 days a week; with this he was complaining of more fatigue, sluggishness and decreased libido and motivation. Previously on Fortesta  gel but this was changed because of redness, irritation and sunburn-like reaction on the skin. Because of the skin  irritation he was changed to the Axiron  preparation, but this could not keep his levels consistent   He was started on Jatenzo  in 06/2019 after approval through his insurance. Initially started on 158 mg and subsequently taking 198 mg twice a day since 02/2020.   # Hypothyroidism was first diagnosed  about 30 years ago. At the time of diagnosis patient was having symptoms of  fatigue but he is not sure about all the other symptoms. Most probably tested also because of strong family history of thyroid  disease. He takes his thyroid  supplement regularly   Since about 12/2021 he has switched to taking his levothyroxine  at  bedtime which is about 3 hours after dinner. He did this because he could not comply with taking it 30 to 60 minutes before breakfast in the morning.   Interval history  Recent laboratory results reviewed, normal thyroid  function test.  Total testosterone  level result awaited.  He has been taking levothyroxine  137 mcg daily and Jatenzo  198 mg 2 times a day.  No hypo and hyperthyroid symptoms.  Overall feeling good.  He complains of requiring frequent urination at night however has not changed in the symptoms.  PSA was normal 1 year ago, was monitored by primary care provider.  No other complaints today.  REVIEW OF SYSTEMS:  As per history of present illness.   PAST MEDICAL HISTORY: Past Medical History:  Diagnosis Date   ALLERGIC REACTION 07/22/2009   ALLERGIC RHINITIS 10/08/2006   B12 DEFICIENCY 05/14/2010   Carpal tunnel syndrome 12/31/2007  Degeneration of cervical intervertebral disc 12/31/2007   HYPERLIPIDEMIA 10/08/2006   Hypogonadism in male 06/26/2009   Qualifier: Diagnosis of  By: Larrie Po MD, John E    HYPOTHYROIDISM 10/08/2006   Irritable bowel syndrome 03/03/2008   LUMBAR RADICULOPATHY, RIGHT 12/31/2006   OSTEOARTHRITIS, LOWER LEG, LEFT 05/10/2009   SINUSITIS - ACUTE-NOS 07/22/2009   TESTICULAR HYPOFUNCTION 06/26/2009    PAST SURGICAL HISTORY: Past Surgical History:   Procedure Laterality Date   BACK SURGERY     I & D EXTREMITY Right 05/24/2020   Procedure: IRRIGATION AND DEBRIDEMENT RIGHT THUMB;  Surgeon: Wes Hamman, MD;  Location: [facility];  Service: Orthopedics;  Laterality: Right;   KNEE SURGERY     LUMBAR LAMINECTOMY  2008   with revision   SPINAL FUSION  2011   spinal fusion revision  2012    ALLERGIES: Allergies  Allergen Reactions   Iodine     IVP dye    Levofloxacin     FAMILY HISTORY:  Family History  Problem Relation Age of Onset   Hypothyroidism Father    Asthma Other    Diabetes Other    Allergy (severe) Other    Hypothyroidism Mother    Hypothyroidism Sister    Hypothyroidism Brother     SOCIAL HISTORY: Social History   Socioeconomic History   Marital status: Married    Spouse name: Not on file   Number of children: Not on file   Years of education: Not on file   Highest education level: Bachelor's degree (e.g., BA, AB, BS)  Occupational History   Not on file  Tobacco Use   Smoking status: Never   Smokeless tobacco: Never  Substance and Sexual Activity   Alcohol use: No   Drug use: No   Sexual activity: Yes  Other Topics Concern   Not on file  Social History Narrative   Not on file   Social Drivers of Health   Financial Resource Strain: Low Risk  (08/24/2022)   Overall Financial Resource Strain (CARDIA)    Difficulty of Paying Living Expenses: Not hard at all  Food Insecurity: No Food Insecurity (08/24/2022)   Hunger Vital Sign    Worried About Running Out of Food in the Last Year: Never true    Ran Out of Food in the Last Year: Never true  Transportation Needs: No Transportation Needs (08/24/2022)   PRAPARE - Administrator, Civil Service (Medical): No    Lack of Transportation (Non-Medical): No  Physical Activity: Sufficiently Active (08/24/2022)   Exercise Vital Sign    Days of Exercise per Week: 6 days    Minutes of Exercise per Session: 80 min  Stress: No Stress  Concern Present (08/24/2022)   [employer] of Occupational Health - Occupational Stress Questionnaire    Feeling of Stress : Not at all  Social Connections: Unknown (08/24/2022)   Social Connection and Isolation Panel [NHANES]    Frequency of Communication with Friends and Family: Patient declined    Frequency of Social Gatherings with Friends and Family: Patient declined    Attends Religious Services: Patient declined    Database administrator or Organizations: No    Attends Engineer, structural: Not on file    Marital Status: Married    MEDICATIONS:  Current Outpatient Medications  Medication Sig Dispense Refill   amoxicillin -clavulanate (AUGMENTIN ) 875-125 MG tablet Take 1 tablet by mouth 2 (two) times daily. (Patient not taking: Reported on 08/14/2023) 14 tablet 0  EPINEPHrine 0.3 mg/0.3 mL IJ SOAJ injection EpiPen 2-Pak 0.3 mg/0.3 mL injection, auto-injector     JATENZO  198 MG CAPS 1 CAPSULE BY MOUTH WITH BREAKFAST AND 1 CAPSULE WITH DINNER DAILY 60 capsule 5   levothyroxine  (SYNTHROID ) 137 MCG tablet Take 1 tablet (137 mcg total) by mouth at bedtime. 90 tablet 3   montelukast (SINGULAIR) 10 MG tablet Take 10 mg by mouth at bedtime.     Multiple Vitamin (MULTIVITAMIN) capsule Take 1 capsule by mouth daily.     nitroGLYCERIN  (NITRODUR - DOSED IN MG/24 HR) 0.2 mg/hr patch Place 1/4 patch to the affected area daily. (Patient not taking: Reported on 08/14/2023) 30 patch 0   Triamcinolone  Acetonide (NASACORT  ALLERGY 24HR NA) Place into the nose. (Patient not taking: Reported on 08/14/2023)     No current facility-administered medications for this visit.    PHYSICAL EXAM: Vitals:   08/14/23 1005  BP: 110/80  Pulse: (!) 59  Resp: 20  SpO2: 98%  Weight: 157 lb 3.2 oz (71.3 kg)  Height: 5\' 10"  (1.778 m)   Body mass index is 22.56 kg/m.  Wt Readings from Last 3 Encounters:  08/14/23 157 lb 3.2 oz (71.3 kg)  06/03/23 150 lb (68 kg)  02/03/23 153 lb 6.4 oz (69.6 kg)      General: Well developed, well nourished male in no apparent distress.  HEENT: AT/[place], no external lesions. Hearing intact to the spoken word Eyes: Conjunctiva clear and no icterus. Neck: Trachea midline, neck supple  Abdomen: Soft, non tender, non distended Neurologic: Alert, oriented, normal speech, deep tendon biceps reflexes normal,  no gross focal neurological deficit Extremities: No pedal pitting edema, no tremors of outstretched hands Skin: Warm, color good.  Psychiatric: Does not appear depressed or anxious  PERTINENT HISTORIC LABORATORY AND IMAGING STUDIES:  All pertinent laboratory results were reviewed. Please see HPI also for further details.   TSH  Date Value Ref Range Status  08/11/2023 0.55 0.40 - 4.50 mIU/L Final  01/01/2023 0.37 0.35 - 5.50 uIU/mL Final  09/30/2022 0.20 (L) 0.35 - 5.50 uIU/mL Final     ASSESSMENT / PLAN  1. Hypogonadotropic hypogonadism (HCC)   2. Acquired hypothyroidism   3. Encounter for medication monitoring   4. Screening for prostate cancer   5. Nocturia     -Patient has longstanding history of hypogonadotropic hypogonadism.  Baseline testosterone  was 208 with symptoms.  Recent total testosterone  level acceptable. -Continue current dose of Jatenzo  198 mg 2 times a day with food. - Follow-up results of total testosterone .  # Primary hypothyroidism -Currently taking levothyroxine  137 mcg daily.  Recent thyroid  lab normal.  Continue current dose.  Medication renewed.  Will change endocrinology follow-up to every 12 months.  Diagnoses and all orders for this visit:  Hypogonadotropic hypogonadism (HCC) -     JATENZO  198 MG CAPS; 1 CAPSULE BY MOUTH WITH BREAKFAST AND 1 CAPSULE WITH DINNER DAILY -     Testosterone , Total, LC/MS/MS  Acquired hypothyroidism -     levothyroxine  (SYNTHROID ) 137 MCG tablet; Take 1 tablet (137 mcg total) by mouth at bedtime. -     T4, free -     TSH  Encounter for medication monitoring -     AST -      Hematocrit -     PSA  Screening for prostate cancer -     PSA  Nocturia -     PSA     DISPOSITION Follow up in clinic in 12 months suggested.  Labs prior to follow-up visit as ordered.  All questions answered and patient verbalized understanding of the plan.  [place] Bilal Manzer, MD [facility] [address] [place], [address] [place], [place] [address] Phone # [phone]  At least part of this note was generated using voice recognition software. Inadvertent word errors may have occurred, which were not recognized during the proofreading process.

## 2023-08-15 ENCOUNTER — Other Ambulatory Visit: Payer: Self-pay | Admitting: Endocrinology

## 2023-08-15 DIAGNOSIS — E230 Hypopituitarism: Secondary | ICD-10-CM

## 2023-08-15 LAB — TSH: TSH: 0.55 m[IU]/L (ref 0.40–4.50)

## 2023-08-15 LAB — T4, FREE: Free T4: 1.3 ng/dL (ref 0.8–1.8)

## 2023-08-15 LAB — TESTOSTERONE, TOTAL, LC/MS/MS: Testosterone, Total, LC-MS-MS: 248 ng/dL — ABNORMAL LOW (ref 250–1100)

## 2023-08-18 ENCOUNTER — Ambulatory Visit (INDEPENDENT_AMBULATORY_CARE_PROVIDER_SITE_OTHER): Admitting: Family Medicine

## 2023-08-18 VITALS — BP 118/80 | Ht 70.0 in | Wt 150.0 lb

## 2023-08-18 DIAGNOSIS — M79671 Pain in right foot: Secondary | ICD-10-CM | POA: Diagnosis not present

## 2023-08-18 DIAGNOSIS — S76312D Strain of muscle, fascia and tendon of the posterior muscle group at thigh level, left thigh, subsequent encounter: Secondary | ICD-10-CM | POA: Diagnosis present

## 2023-08-18 NOTE — Progress Notes (Cosign Needed)
 PCP: Roslyn Coombe, MD  Subjective:   HPI: Patient is a 68 y.o. male here for left hamstring pain and right foot pain  Seen 05/2023 for left calf strain. Received shockwave therapy x 2 and pain has now gone away.  Left hamstring pain Possible prior injury in 07/2022 denies head ongoing intermittent pain since that time but worse over the past couple months.  Did run the [place] marathon in 07/2023 with significant pain.  Currently training for [place] marathon in 11/2023 and slowly increasing his mileage.  Able to prone hamstring curl 50 pounds without issue.  Pain is mostly with running and intermittent, sometimes occurs at mild 1 other times can occur around mile 5.  On good days feels like his pain is 3 of 10.  Right heel pain Worse over the past couple of months.  Notices it most with weighted leg raises that will with pain in the heel.  Still able to run without difficulty.  Notices sometimes his foot looks "purple all over" after exercise but quickly resolves.  Previously wore inserts for planta fasciitis but has not worn in the past 6 years.  Not interested in inserts is due to changes shoe and running dynamic.   Past Medical History:  Diagnosis Date   ALLERGIC REACTION 07/22/2009   ALLERGIC RHINITIS 10/08/2006   B12 DEFICIENCY 05/14/2010   Carpal tunnel syndrome 12/31/2007   Degeneration of cervical intervertebral disc 12/31/2007   HYPERLIPIDEMIA 10/08/2006   Hypogonadism in male 06/26/2009   Qualifier: Diagnosis of  By: Larrie Po MD, John E    HYPOTHYROIDISM 10/08/2006   Irritable bowel syndrome 03/03/2008   LUMBAR RADICULOPATHY, RIGHT 12/31/2006   OSTEOARTHRITIS, LOWER LEG, LEFT 05/10/2009   SINUSITIS - ACUTE-NOS 07/22/2009   TESTICULAR HYPOFUNCTION 06/26/2009    Current Outpatient Medications on File Prior to Visit  Medication Sig Dispense Refill   amoxicillin -clavulanate (AUGMENTIN ) 875-125 MG tablet Take 1 tablet by mouth 2 (two) times daily. (Patient not taking: Reported on 08/14/2023) 14  tablet 0   EPINEPHrine 0.3 mg/0.3 mL IJ SOAJ injection EpiPen 2-Pak 0.3 mg/0.3 mL injection, auto-injector     JATENZO  198 MG CAPS 1 CAPSULE BY MOUTH WITH BREAKFAST AND 1 CAPSULE WITH DINNER DAILY 60 capsule 5   levothyroxine  (SYNTHROID ) 137 MCG tablet Take 1 tablet (137 mcg total) by mouth at bedtime. 90 tablet 3   montelukast (SINGULAIR) 10 MG tablet Take 10 mg by mouth at bedtime.     Multiple Vitamin (MULTIVITAMIN) capsule Take 1 capsule by mouth daily.     nitroGLYCERIN  (NITRODUR - DOSED IN MG/24 HR) 0.2 mg/hr patch Place 1/4 patch to the affected area daily. (Patient not taking: Reported on 08/14/2023) 30 patch 0   Triamcinolone  Acetonide (NASACORT  ALLERGY 24HR NA) Place into the nose. (Patient not taking: Reported on 08/14/2023)     No current facility-administered medications on file prior to visit.    Past Surgical History:  Procedure Laterality Date   BACK SURGERY     I & D EXTREMITY Right 05/24/2020   Procedure: IRRIGATION AND DEBRIDEMENT RIGHT THUMB;  Surgeon: Wes Hamman, MD;  Location: [facility];  Service: Orthopedics;  Laterality: Right;   KNEE SURGERY     LUMBAR LAMINECTOMY  2008   with revision   SPINAL FUSION  2011   spinal fusion revision  2012    Allergies  Allergen Reactions   Iodine     IVP dye    Levofloxacin     BP 118/80  Ht 5\' 10"  (1.778 m)   Wt 150 lb (68 kg)   BMI 21.52 kg/m      12/22/2019   11:13 AM 01/31/2020    3:41 PM 02/14/2020    3:35 PM 03/15/2020    3:51 PM 02/28/2021    1:24 PM 04/02/2021    4:26 PM 06/25/2021    8:55 AM  [facility] Adult Exercise  Frequency of aerobic exercise (# of days/week) 7 7 6 6 6 6 4   Average time in minutes 90 90 75 75 80 80 90  Frequency of strengthening activities (# of days/week) 3 1 0 0 1 1 2         No data to display              Objective:  Physical Exam:  Gen: NAD, comfortable in exam room MSK: Left leg: -No erythema, edema or ecchymosis noted -Mild TTP over  left ischial tuberosity and proximal hamstring tendon -Minimal pain with resisted straight leg flexion on the left leg -Full ROM without pain -Distal sensation intact and good cap refill  Right foot: -No deformity, erythema or edema noted -Area of mild tenderness around mid heel and some tenderness along plantar fascia -Negative Thompson test -Negative anterior drawer and talar tilt test -Full ankle ROM without pain   Assessment & Plan:  1.  Left hamstring strain: Most consistent with hamstring strain, likely grade 1.  Not improved with conservative therapy, patient in agreement to initiate shockwave therapy.  Performed today and can return as needed for additional sessions (may need 4-6 sessions).  Continue supportive care with home strengthening and activity gradation.  No indication for MRI at this time, low concern for hamstring tear. -F/u in 1 week for repeat shockwave therapy  2.  Right heel pain: Possible component of plantar fasciitis vs fat pad atrophy.  Recommend trialing gel heel cup and continuing home stretching and strengthening exercises

## 2023-08-18 NOTE — Patient Instructions (Signed)
 Follow up in 1 week for shockwave of your hamstring. Consider heel cup to cushion the fat pad on your heel. Do the plantar fascia stretches and strengthening exercises. No restrictions on running.

## 2023-08-19 ENCOUNTER — Encounter: Payer: Self-pay | Admitting: Internal Medicine

## 2023-08-19 ENCOUNTER — Ambulatory Visit (HOSPITAL_COMMUNITY)
Admission: RE | Admit: 2023-08-19 | Discharge: 2023-08-19 | Disposition: A | Discharge status: Home / Self Care | Payer: Self-pay | Source: Ambulatory Visit | Attending: Internal Medicine | Admitting: Internal Medicine

## 2023-08-19 DIAGNOSIS — R739 Hyperglycemia, unspecified: Secondary | ICD-10-CM | POA: Insufficient documentation

## 2023-08-19 DIAGNOSIS — R9431 Abnormal electrocardiogram [ECG] [EKG]: Secondary | ICD-10-CM | POA: Insufficient documentation

## 2023-08-19 DIAGNOSIS — E7800 Pure hypercholesterolemia, unspecified: Secondary | ICD-10-CM | POA: Insufficient documentation

## 2023-08-25 ENCOUNTER — Ambulatory Visit (INDEPENDENT_AMBULATORY_CARE_PROVIDER_SITE_OTHER): Payer: Self-pay | Admitting: Family Medicine

## 2023-08-25 DIAGNOSIS — S76312D Strain of muscle, fascia and tendon of the posterior muscle group at thigh level, left thigh, subsequent encounter: Secondary | ICD-10-CM

## 2023-08-27 NOTE — Progress Notes (Signed)
 Patient returns for second shockwave treatment for left hamstring. Feels this is improving. Able to run 12 miles. More proximal pain improved especially.  Procedure: ECSWT Indications:  left hamstring strain   Procedure Details Consent: Risks of procedure as well as the alternatives and risks of each were explained to the patient.  Written consent for procedure obtained. Time Out: Verified patient identification, verified procedure, site was marked, verified correct patient position, medications/allergies/relevent history reviewed.  The area was cleaned with alcohol swab.     The left hamstring was targeted for Extracorporeal shockwave therapy.    Preset: status post muscle injury Power Level: 100 Frequency: 16 Impulse/cycles: 3000 mid-distal hamstrings Head size: large   Patient tolerated procedure well without immediate complications

## 2023-09-01 ENCOUNTER — Other Ambulatory Visit

## 2023-09-01 ENCOUNTER — Ambulatory Visit (INDEPENDENT_AMBULATORY_CARE_PROVIDER_SITE_OTHER): Payer: Self-pay | Admitting: Family Medicine

## 2023-09-01 DIAGNOSIS — S76312D Strain of muscle, fascia and tendon of the posterior muscle group at thigh level, left thigh, subsequent encounter: Secondary | ICD-10-CM

## 2023-09-01 NOTE — Progress Notes (Signed)
 Patient returns for third shockwave treatment for left hamstring. Was running on a track and actually started having issues with right leg/hamstring recently - no acute injury.  Will monitor.  Procedure: ECSWT Indications:  left hamstring strain   Procedure Details Consent: Risks of procedure as well as the alternatives and risks of each were explained to the patient.  Written consent for procedure obtained. Time Out: Verified patient identification, verified procedure, site was marked, verified correct patient position, medications/allergies/relevent history reviewed.  The area was cleaned with alcohol swab.     The left hamstring was targeted for Extracorporeal shockwave therapy.    Preset: 100 Power Level: 16 Frequency: 16 Impulse/cycles: 3000 mid-distal hamstrings Head size: large   Patient tolerated procedure well without immediate complications

## 2023-09-04 ENCOUNTER — Encounter: Payer: Self-pay | Admitting: Family Medicine

## 2023-09-04 ENCOUNTER — Ambulatory Visit: Payer: Self-pay | Admitting: Endocrinology

## 2023-09-04 ENCOUNTER — Ambulatory Visit (INDEPENDENT_AMBULATORY_CARE_PROVIDER_SITE_OTHER): Payer: Self-pay | Admitting: Family Medicine

## 2023-09-04 DIAGNOSIS — S76312D Strain of muscle, fascia and tendon of the posterior muscle group at thigh level, left thigh, subsequent encounter: Secondary | ICD-10-CM

## 2023-09-04 LAB — T4, FREE: Free T4: 1.5 ng/dL (ref 0.8–1.8)

## 2023-09-04 LAB — AST: AST: 31 U/L (ref 10–35)

## 2023-09-04 LAB — HEMATOCRIT: HCT: 44.7 % (ref 38.5–50.0)

## 2023-09-04 LAB — TSH: TSH: 0.3 m[IU]/L — ABNORMAL LOW (ref 0.40–4.50)

## 2023-09-04 LAB — PSA: PSA: 1.29 ng/mL (ref <=4.00)

## 2023-09-04 LAB — TESTOSTERONE, TOTAL, LC/MS/MS: Testosterone, Total, LC-MS-MS: 614 ng/dL (ref 250–1100)

## 2023-09-04 NOTE — Patient Instructions (Signed)

## 2023-09-04 NOTE — Progress Notes (Signed)
 FOLLOW-UP VISIT:  Shockwave Therapy Procedure   NAME:  Treg Diemer Kallen DOB:  12-Nov-1955 MR#: [id number] DATE OF VISIT:  09/04/2023  Encounter:   Keir comes in for a follow up evaluation and 4th Radial Extracorporeal Shockwave therapy (ESWT) treatment to the  left hamstring.  Elias reports feeling 25-30% improved overall since starting shockwave therapy Able to run 6-7 miles without issues Using thigh sleeve Moving to [place], [place], on 09/14/23   ESWT Procedure Note: Treatment #4  Diagnosis: Chronic left hamstring strain  Procedure Details  Consent for the procedure was obtained. Time out was performed. The anatomical landmarks and target areas for the procedure were identified.   PROCEDURE: ESWT was discussed with the patient in detail.  The risk and benefits were explained.  The point of maximal tenderness was identified and the oscillator probe was applied with moderate pressure. Treatment adjusted as mediated by patient feedback/pain.  Mid-belly and distal LT hamstring was targeted for ESWT  Preset: post muscle injury Power level: 120 MJ Frequency: 16 Hz Impulses: 3000 Head size: large   Complications:  None; patient tolerated the procedure well.  There are no diagnoses linked to this encounter.  Plan:   ESWT completed today as noted above Return in 1 week for ESWT procedure #5 Procedure aftercare reviewed Recommended avoiding strenuous activities and using OTC analgesia prn.

## 2023-09-11 ENCOUNTER — Ambulatory Visit (INDEPENDENT_AMBULATORY_CARE_PROVIDER_SITE_OTHER): Payer: Self-pay | Admitting: Family Medicine

## 2023-09-11 ENCOUNTER — Encounter: Payer: Self-pay | Admitting: Family Medicine

## 2023-09-11 DIAGNOSIS — M5416 Radiculopathy, lumbar region: Secondary | ICD-10-CM

## 2023-09-11 DIAGNOSIS — S76312D Strain of muscle, fascia and tendon of the posterior muscle group at thigh level, left thigh, subsequent encounter: Secondary | ICD-10-CM

## 2023-09-11 NOTE — Progress Notes (Signed)
 PCP: Roslyn Coombe, MD  Subjective:   HPI: Patient is a 68 y.o. male here for shockwave left hamstring.  Joseph Santana continues to struggle with left hamstring pain, tightness despite months of rehab, shockwave therapy. He also reports some low back itchiness/discomfort No numbness/tingling. Relative weakness in left leg though it's overall strong.  Past Medical History:  Diagnosis Date   ALLERGIC REACTION 07/22/2009   ALLERGIC RHINITIS 10/08/2006   B12 DEFICIENCY 05/14/2010   Carpal tunnel syndrome 12/31/2007   Degeneration of cervical intervertebral disc 12/31/2007   HYPERLIPIDEMIA 10/08/2006   Hypogonadism in male 06/26/2009   Qualifier: Diagnosis of  By: Larrie Po MD, John E    HYPOTHYROIDISM 10/08/2006   Irritable bowel syndrome 03/03/2008   LUMBAR RADICULOPATHY, RIGHT 12/31/2006   OSTEOARTHRITIS, LOWER LEG, LEFT 05/10/2009   SINUSITIS - ACUTE-NOS 07/22/2009   TESTICULAR HYPOFUNCTION 06/26/2009    Current Outpatient Medications on File Prior to Visit  Medication Sig Dispense Refill   amoxicillin -clavulanate (AUGMENTIN ) 875-125 MG tablet Take 1 tablet by mouth 2 (two) times daily. (Patient not taking: Reported on 08/14/2023) 14 tablet 0   EPINEPHrine 0.3 mg/0.3 mL IJ SOAJ injection EpiPen 2-Pak 0.3 mg/0.3 mL injection, auto-injector     JATENZO  198 MG CAPS 1 CAPSULE BY MOUTH WITH BREAKFAST AND 1 CAPSULE WITH DINNER DAILY 60 capsule 5   levothyroxine  (SYNTHROID ) 137 MCG tablet Take 1 tablet (137 mcg total) by mouth at bedtime. 90 tablet 3   montelukast (SINGULAIR) 10 MG tablet Take 10 mg by mouth at bedtime.     Multiple Vitamin (MULTIVITAMIN) capsule Take 1 capsule by mouth daily.     nitroGLYCERIN  (NITRODUR - DOSED IN MG/24 HR) 0.2 mg/hr patch Place 1/4 patch to the affected area daily. (Patient not taking: Reported on 08/14/2023) 30 patch 0   Triamcinolone  Acetonide (NASACORT  ALLERGY 24HR NA) Place into the nose. (Patient not taking: Reported on 08/14/2023)     No current facility-administered  medications on file prior to visit.    Past Surgical History:  Procedure Laterality Date   BACK SURGERY     I & D EXTREMITY Right 05/24/2020   Procedure: IRRIGATION AND DEBRIDEMENT RIGHT THUMB;  Surgeon: Wes Hamman, MD;  Location: [facility];  Service: Orthopedics;  Laterality: Right;   KNEE SURGERY     LUMBAR LAMINECTOMY  2008   with revision   SPINAL FUSION  2011   spinal fusion revision  2012    Allergies  Allergen Reactions   Iodine     IVP dye    Levofloxacin     There were no vitals taken for this visit.     12/22/2019   11:13 AM 01/31/2020    3:41 PM 02/14/2020    3:35 PM 03/15/2020    3:51 PM 02/28/2021    1:24 PM 04/02/2021    4:26 PM 06/25/2021    8:55 AM  [facility] Adult Exercise  Frequency of aerobic exercise (# of days/week) 7 7 6 6 6 6 4   Average time in minutes 90 90 75 75 80 80 90  Frequency of strengthening activities (# of days/week) 3 1 0 0 1 1 2         No data to display              Objective:  Physical Exam:  Gen: NAD, comfortable in exam room  Back/left leg: No gross deformity, scoliosis. No TTP Strength LEs 5/5 all muscle groups.   2+ MSRs medial hamstrings, equal bilaterally.  Negative SLRs. Sensation intact to light touch bilaterally.   Assessment & Plan:  1. Left hamstring pain/strain - not improving with months of conservative treatment.  5th shockwave treatment given today but will go forward with MRI lumbar spine to assess for radicular source - evidence in our study shows a link between the two and it can be the reason for his lack of improvement.  Procedure: ECSWT Indications:  left hamstring strain   Procedure Details Consent: Risks of procedure as well as the alternatives and risks of each were explained to the patient.  Written consent for procedure obtained. Time Out: Verified patient identification, verified procedure, site was marked, verified correct patient position,  medications/allergies/relevent history reviewed.  The area was cleaned with alcohol swab.     The left hamstring was targeted for Extracorporeal shockwave therapy.    Preset: status post muscle injury Power Level: 120 Frequency: 16 Impulse/cycles: 3000 Head size: large   Patient tolerated procedure well without immediate complications

## 2023-09-14 ENCOUNTER — Ambulatory Visit (HOSPITAL_BASED_OUTPATIENT_CLINIC_OR_DEPARTMENT_OTHER)
Admission: RE | Admit: 2023-09-14 | Discharge: 2023-09-14 | Disposition: A | Discharge status: Home / Self Care | Source: Ambulatory Visit | Attending: Family Medicine | Admitting: Family Medicine

## 2023-09-14 DIAGNOSIS — M5416 Radiculopathy, lumbar region: Secondary | ICD-10-CM | POA: Insufficient documentation

## 2023-09-19 ENCOUNTER — Encounter: Payer: Self-pay | Admitting: Family Medicine

## 2023-09-19 NOTE — Telephone Encounter (Signed)
 Give it through the weekend and if we still haven't seen the results Monday then give them a call.  Thanks!

## 2023-09-26 ENCOUNTER — Ambulatory Visit: Payer: Self-pay | Admitting: Family Medicine

## 2023-10-04 ENCOUNTER — Other Ambulatory Visit: Payer: Self-pay | Admitting: Family Medicine

## 2023-10-06 ENCOUNTER — Encounter: Payer: Self-pay | Admitting: Family Medicine

## 2023-10-06 ENCOUNTER — Other Ambulatory Visit: Payer: Self-pay

## 2023-10-06 MED ORDER — NITROGLYCERIN 0.2 MG/HR TD PT24: MEDICATED_PATCH | TRANSDERMAL | 0 refills | Status: AC

## 2023-10-31 ENCOUNTER — Encounter: Payer: Self-pay | Admitting: Internal Medicine

## 2023-11-05 NOTE — Telephone Encounter (Signed)
 I dont personallly know this. But we have had good luck in the past with the [facility] ADHD clinic.

## 2023-11-10 ENCOUNTER — Other Ambulatory Visit: Payer: Self-pay | Admitting: Endocrinology

## 2023-11-10 DIAGNOSIS — E039 Hypothyroidism, unspecified: Secondary | ICD-10-CM

## 2023-11-26 ENCOUNTER — Telehealth: Payer: Self-pay | Admitting: Endocrinology

## 2023-11-26 DIAGNOSIS — E230 Hypopituitarism: Secondary | ICD-10-CM

## 2023-11-26 MED ORDER — JATENZO 198 MG PO CAPS
ORAL_CAPSULE | ORAL | 1 refills | Status: DC
Start: 2023-11-26 — End: 2023-11-27

## 2023-11-26 NOTE — Telephone Encounter (Signed)
 MEDICATION: JATENZO  198 MG CAPS   PHARMACY:    [id number] - [place], [place] - [address] (Ph: [phone])    HAS THE PATIENT CONTACTED THEIR PHARMACY?  Yes  LAST REFILL:  @@LASTREFILL @  IS THIS A 90 DAY SUPPLY : Yes  IS PATIENT OUT OF MEDICATION: Yes  IF NOT; HOW MUCH IS LEFT:   LAST APPOINTMENT DATE: @5 /04/2023  NEXT APPOINTMENT DATE:@5 /04/2024  DO WE HAVE YOUR PERMISSION TO LEAVE A DETAILED MESSAGE?:Yes  OTHER COMMENTS: Patient says that [facility] [place] [place] and they will not transfer to [facility] in [place], [place].   **Let patient know to contact pharmacy at the end of the day to make sure medication is ready. **  ** Please notify patient to allow 48-72 hours to process**  **Encourage patient to contact the pharmacy for refills or they can request refills through [facility]**

## 2023-11-26 NOTE — Addendum Note (Signed)
 Addended by: Donne [place], [place] on: 11/26/2023 02:20 PM   Modules accepted: Orders

## 2023-11-27 ENCOUNTER — Telehealth: Payer: Self-pay

## 2023-11-27 DIAGNOSIS — E230 Hypopituitarism: Secondary | ICD-10-CM

## 2023-11-27 MED ORDER — JATENZO 198 MG PO CAPS: ORAL_CAPSULE | ORAL | 1 refills | Status: DC

## 2023-11-27 NOTE — Telephone Encounter (Signed)
 Jatenzo  needs to be sent to [place] on [facility].

## 2023-12-18 ENCOUNTER — Telehealth: Payer: Self-pay | Admitting: Internal Medicine

## 2023-12-18 NOTE — Telephone Encounter (Signed)
 Contacted Joseph Santana to schedule their annual wellness visit. Patient declined to schedule AWV at this time. No longer living in [place]. Patient now lives in [place].   [facility] Care Guide [facility] AWV TEAM Direct Dial: [phone]

## 2024-01-02 ENCOUNTER — Other Ambulatory Visit: Payer: Self-pay | Admitting: Family Medicine

## 2024-01-05 ENCOUNTER — Telehealth: Payer: Self-pay | Admitting: Internal Medicine

## 2024-01-05 NOTE — Telephone Encounter (Signed)
 Contacted Joseph Santana to schedule their annual wellness visit. Patient declined to schedule AWV at this time. Patient has moved to [place].   [facility] Care Guide [facility] AWV TEAM Direct Dial: [phone]

## 2024-02-25 ENCOUNTER — Other Ambulatory Visit: Payer: Self-pay | Admitting: Endocrinology

## 2024-02-25 DIAGNOSIS — E230 Hypopituitarism: Secondary | ICD-10-CM

## 2024-08-06 ENCOUNTER — Other Ambulatory Visit

## 2024-08-13 ENCOUNTER — Ambulatory Visit: Admitting: Endocrinology
